# Patient Record
Sex: Female | Born: 1935 | Race: White | Hispanic: No | Marital: Married | Smoking: Never smoker
Health system: Southern US, Community
[De-identification: ages and names within clinical notes are randomized; demographics above are authoritative.]

## PROBLEM LIST (undated history)

## (undated) DIAGNOSIS — K219 Gastro-esophageal reflux disease without esophagitis: Secondary | ICD-10-CM

## (undated) DIAGNOSIS — E785 Hyperlipidemia, unspecified: Secondary | ICD-10-CM

## (undated) DIAGNOSIS — E041 Nontoxic single thyroid nodule: Secondary | ICD-10-CM

## (undated) DIAGNOSIS — M1990 Unspecified osteoarthritis, unspecified site: Secondary | ICD-10-CM

## (undated) DIAGNOSIS — M353 Polymyalgia rheumatica: Secondary | ICD-10-CM

## (undated) HISTORY — PX: BACK SURGERY: SHX140

## (undated) HISTORY — DX: Hyperlipidemia, unspecified: E78.5

## (undated) HISTORY — DX: Gastro-esophageal reflux disease without esophagitis: K21.9

## (undated) HISTORY — DX: Unspecified osteoarthritis, unspecified site: M19.90

## (undated) HISTORY — DX: Nontoxic single thyroid nodule: E04.1

## (undated) HISTORY — DX: Polymyalgia rheumatica: M35.3

---
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
## 1964-10-14 HISTORY — PX: CHOLECYSTECTOMY: SHX55

## 1976-10-14 HISTORY — PX: ABDOMINAL HYSTERECTOMY: SHX81

## 1998-10-04 ENCOUNTER — Emergency Department (HOSPITAL_COMMUNITY): Admission: EM | Admit: 1998-10-04 | Discharge: 1998-10-04 | Payer: Self-pay

## 1999-04-04 ENCOUNTER — Ambulatory Visit (HOSPITAL_COMMUNITY): Admission: RE | Admit: 1999-04-04 | Discharge: 1999-04-04 | Payer: Self-pay | Admitting: Gastroenterology

## 1999-04-04 ENCOUNTER — Encounter (INDEPENDENT_AMBULATORY_CARE_PROVIDER_SITE_OTHER): Payer: Self-pay | Admitting: Specialist

## 1999-05-02 ENCOUNTER — Encounter: Payer: Self-pay | Admitting: Gastroenterology

## 1999-05-02 ENCOUNTER — Ambulatory Visit (HOSPITAL_COMMUNITY): Admission: RE | Admit: 1999-05-02 | Discharge: 1999-05-02 | Payer: Self-pay | Admitting: Gastroenterology

## 2000-02-21 ENCOUNTER — Encounter: Payer: Self-pay | Admitting: Family Medicine

## 2000-02-21 ENCOUNTER — Encounter: Admission: RE | Admit: 2000-02-21 | Discharge: 2000-02-21 | Payer: Self-pay | Admitting: Family Medicine

## 2000-03-31 ENCOUNTER — Encounter: Payer: Self-pay | Admitting: Family Medicine

## 2000-03-31 ENCOUNTER — Encounter: Admission: RE | Admit: 2000-03-31 | Discharge: 2000-03-31 | Payer: Self-pay | Admitting: Family Medicine

## 2001-02-25 ENCOUNTER — Encounter: Payer: Self-pay | Admitting: Family Medicine

## 2001-02-25 ENCOUNTER — Encounter: Admission: RE | Admit: 2001-02-25 | Discharge: 2001-02-25 | Payer: Self-pay | Admitting: Family Medicine

## 2001-06-30 ENCOUNTER — Encounter: Payer: Self-pay | Admitting: Cardiovascular Disease

## 2001-07-01 ENCOUNTER — Inpatient Hospital Stay (HOSPITAL_COMMUNITY): Admission: EM | Admit: 2001-07-01 | Discharge: 2001-07-02 | Payer: Self-pay | Admitting: Emergency Medicine

## 2001-11-25 ENCOUNTER — Encounter: Admission: RE | Admit: 2001-11-25 | Discharge: 2001-11-25 | Payer: Self-pay | Admitting: Family Medicine

## 2001-11-25 ENCOUNTER — Encounter: Payer: Self-pay | Admitting: Family Medicine

## 2002-02-15 ENCOUNTER — Encounter: Payer: Self-pay | Admitting: Cardiovascular Disease

## 2002-03-01 ENCOUNTER — Encounter: Admission: RE | Admit: 2002-03-01 | Discharge: 2002-03-01 | Payer: Self-pay | Admitting: *Deleted

## 2002-03-01 ENCOUNTER — Encounter: Payer: Self-pay | Admitting: *Deleted

## 2002-03-03 ENCOUNTER — Ambulatory Visit (HOSPITAL_BASED_OUTPATIENT_CLINIC_OR_DEPARTMENT_OTHER): Admission: RE | Admit: 2002-03-03 | Discharge: 2002-03-03 | Payer: Self-pay | Admitting: *Deleted

## 2002-07-20 ENCOUNTER — Encounter: Payer: Self-pay | Admitting: Family Medicine

## 2002-07-20 ENCOUNTER — Encounter: Admission: RE | Admit: 2002-07-20 | Discharge: 2002-07-20 | Payer: Self-pay | Admitting: Family Medicine

## 2003-08-17 ENCOUNTER — Encounter: Admission: RE | Admit: 2003-08-17 | Discharge: 2003-08-17 | Payer: Self-pay | Admitting: Family Medicine

## 2004-07-16 ENCOUNTER — Encounter: Payer: Self-pay | Admitting: Cardiovascular Disease

## 2004-08-25 ENCOUNTER — Encounter: Admission: RE | Admit: 2004-08-25 | Discharge: 2004-08-25 | Payer: Self-pay

## 2004-10-23 ENCOUNTER — Encounter: Admission: RE | Admit: 2004-10-23 | Discharge: 2004-10-23 | Payer: Self-pay | Admitting: Family Medicine

## 2005-07-08 ENCOUNTER — Encounter: Payer: Self-pay | Admitting: Cardiovascular Disease

## 2005-08-22 ENCOUNTER — Encounter: Payer: Self-pay | Admitting: Neurology

## 2005-08-23 ENCOUNTER — Encounter (INDEPENDENT_AMBULATORY_CARE_PROVIDER_SITE_OTHER): Payer: Self-pay | Admitting: *Deleted

## 2005-08-23 ENCOUNTER — Inpatient Hospital Stay (HOSPITAL_COMMUNITY): Admission: RE | Admit: 2005-08-23 | Discharge: 2005-08-23 | Payer: Self-pay | Admitting: Interventional Radiology

## 2005-12-18 ENCOUNTER — Encounter: Admission: RE | Admit: 2005-12-18 | Discharge: 2005-12-18 | Payer: Self-pay | Admitting: Family Medicine

## 2007-02-08 ENCOUNTER — Emergency Department (HOSPITAL_COMMUNITY): Admission: EM | Admit: 2007-02-08 | Discharge: 2007-02-09 | Payer: Self-pay | Admitting: Emergency Medicine

## 2007-07-22 ENCOUNTER — Encounter: Admission: RE | Admit: 2007-07-22 | Discharge: 2007-07-22 | Payer: Self-pay | Admitting: Neurological Surgery

## 2009-03-15 ENCOUNTER — Ambulatory Visit: Payer: Self-pay | Admitting: Cardiovascular Disease

## 2009-03-15 ENCOUNTER — Inpatient Hospital Stay (HOSPITAL_COMMUNITY): Admission: EM | Admit: 2009-03-15 | Discharge: 2009-03-18 | Payer: Self-pay | Admitting: Emergency Medicine

## 2009-03-16 ENCOUNTER — Encounter (INDEPENDENT_AMBULATORY_CARE_PROVIDER_SITE_OTHER): Payer: Self-pay | Admitting: Internal Medicine

## 2009-03-25 ENCOUNTER — Encounter: Payer: Self-pay | Admitting: Cardiovascular Disease

## 2009-04-04 ENCOUNTER — Encounter: Payer: Self-pay | Admitting: Cardiovascular Disease

## 2009-04-18 DIAGNOSIS — M129 Arthropathy, unspecified: Secondary | ICD-10-CM | POA: Insufficient documentation

## 2009-04-18 DIAGNOSIS — M810 Age-related osteoporosis without current pathological fracture: Secondary | ICD-10-CM | POA: Insufficient documentation

## 2009-04-18 DIAGNOSIS — Z8669 Personal history of other diseases of the nervous system and sense organs: Secondary | ICD-10-CM

## 2009-04-18 DIAGNOSIS — R092 Respiratory arrest: Secondary | ICD-10-CM | POA: Insufficient documentation

## 2009-04-18 DIAGNOSIS — J189 Pneumonia, unspecified organism: Secondary | ICD-10-CM | POA: Insufficient documentation

## 2009-04-19 ENCOUNTER — Ambulatory Visit: Payer: Self-pay | Admitting: Internal Medicine

## 2009-04-19 DIAGNOSIS — E785 Hyperlipidemia, unspecified: Secondary | ICD-10-CM | POA: Insufficient documentation

## 2009-04-19 DIAGNOSIS — R5383 Other fatigue: Secondary | ICD-10-CM

## 2009-04-19 DIAGNOSIS — R5381 Other malaise: Secondary | ICD-10-CM | POA: Insufficient documentation

## 2009-05-01 DIAGNOSIS — R079 Chest pain, unspecified: Secondary | ICD-10-CM | POA: Insufficient documentation

## 2009-05-01 DIAGNOSIS — F411 Generalized anxiety disorder: Secondary | ICD-10-CM | POA: Insufficient documentation

## 2009-05-01 DIAGNOSIS — K219 Gastro-esophageal reflux disease without esophagitis: Secondary | ICD-10-CM | POA: Insufficient documentation

## 2009-05-02 ENCOUNTER — Ambulatory Visit: Payer: Self-pay | Admitting: Cardiovascular Disease

## 2009-10-26 ENCOUNTER — Ambulatory Visit: Payer: Self-pay | Admitting: Cardiovascular Disease

## 2009-10-30 ENCOUNTER — Telehealth (INDEPENDENT_AMBULATORY_CARE_PROVIDER_SITE_OTHER): Payer: Self-pay | Admitting: Radiology

## 2009-10-31 ENCOUNTER — Ambulatory Visit: Payer: Self-pay | Admitting: Cardiology

## 2009-10-31 ENCOUNTER — Ambulatory Visit: Payer: Self-pay

## 2009-10-31 ENCOUNTER — Encounter (HOSPITAL_COMMUNITY): Admission: RE | Admit: 2009-10-31 | Discharge: 2010-01-19 | Payer: Self-pay | Admitting: Cardiovascular Disease

## 2010-03-01 ENCOUNTER — Encounter (INDEPENDENT_AMBULATORY_CARE_PROVIDER_SITE_OTHER): Payer: Self-pay | Admitting: *Deleted

## 2010-05-01 ENCOUNTER — Ambulatory Visit: Payer: Self-pay | Admitting: Cardiovascular Disease

## 2010-11-03 ENCOUNTER — Encounter: Payer: Self-pay | Admitting: Interventional Radiology

## 2010-11-04 ENCOUNTER — Encounter: Payer: Self-pay | Admitting: Orthopedic Surgery

## 2010-11-13 NOTE — Assessment & Plan Note (Signed)
Summary: Cardiology Nuclear Study  Nuclear Med Background Indications for Stress Test: Evaluation for Ischemia   History: Echo  History Comments: H/O respiratory arrest in setting of pneumonia 03/15/09; 6/10 Echo:EF=60-65%   Symptoms: Chest Tightness, Nausea, Syncope  Symptoms Comments: Last episode of CP:2 days ago. Chest tightness vs "indigestion".   Nuclear Pre-Procedure Cardiac Risk Factors: Family History - CAD, Lipids Caffeine/Decaff Intake: none NPO After: 10:00 PM Lungs: Clear IV 0.9% NS with Angio Cath: 20g     IV Site: (R) AC IV Started by: Stanton Kidney EMT-P Chest Size (in) 38     Cup Size DDD     Height (in): 61 Weight (lb): 162 BMI: 30.72  Nuclear Med Study 1 or 2 day study:  1 day     Stress Test Type:  Stress Reading MD:  Olga Millers, MD     Referring MD:  Charlton Haws, MD Resting Radionuclide:  Technetium 77m Tetrofosmin     Resting Radionuclide Dose:  11.0 mCi  Stress Radionuclide:  Technetium 27m Tetrofosmin     Stress Radionuclide Dose:  32.0 mCi   Stress Protocol Exercise Time (min):  4:31 min     Max HR:  126 bpm     Predicted Max HR:  140 bpm  Max Systolic BP: 215 mm Hg     Percent Max HR:  90 %     METS: 6.4 Rate Pressure Product:  52841    Stress Test Technologist:  Rea College CMA-N     Nuclear Technologist:  Harlow Asa CNMT  Rest Procedure  Myocardial perfusion imaging was performed at rest 45 minutes following the intravenous administration of Myoview Technetium 22m Tetrofosmin.  Stress Procedure  The patient exercised for 4:31.  The patient stopped due to fatigue and hypertensive response, 215/104.  She had new baseline hypertension, sitting 158/95 and standing 153/106.  She denied any chest pain.  There were no significant ST-T wave changes, only occasional PVC's in recovery.  Myoview was injected at peak exercise and myocardial perfusion imaging was performed after a brief delay.  QPS Raw Data Images:  Acuisition technically good;  normal left ventricular size. Stress Images:  There is normal uptake in all areas. Rest Images:  Normal homogeneous uptake in all areas of the myocardium. Subtraction (SDS):  No evidence of ischemia. Transient Ischemic Dilatation:  .83  (Normal <1.22)  Lung/Heart Ratio:  .34  (Normal <0.45)  Quantitative Gated Spect Images QGS EDV:  54 ml QGS ESV:  15 ml QGS EF:  73 % QGS cine images:  Normal wall motion.   Overall Impression  Exercise Capacity: Poor exercise capacity. BP Response: Hypertensive blood pressure response. Clinical Symptoms: No chest pain ECG Impression: No significant ST segment change suggestive of ischemia. Overall Impression: There is no sign of scar or ischemia.  Appended Document: Cardiology Nuclear Study normal nuclear  Appended Document: Cardiology Nuclear Study pt aware of results

## 2010-11-13 NOTE — Progress Notes (Signed)
Summary: Nuc Pre-Procedure  Phone Note Outgoing Call Call back at [facility] Phone [phone]   Call placed by: Leonia Corona, RT-N,  October 30, 2009 3:03 PM Call placed to: Patient Reason for Call: Confirm/change Appt Summary of Call: Reviewed information on Myoview Information Sheet (see scanned document for further details).  Spoke with patient.     Nuclear Med Background Indications for Stress Test: Evaluation for Ischemia   History: Echo  History Comments: H/O respiratory arrest in setting of pneumonia 03/15/09. 03/16/09- Echo- EF= 60-65% GERD  Symptoms: Nausea, Syncope    Nuclear Pre-Procedure Cardiac Risk Factors: Family History - CAD, Lipids Height (in): 61

## 2010-11-13 NOTE — Assessment & Plan Note (Signed)
Summary: rov/syncope/jml   Primary Provider:  Dr. Herb Grays  CC:  dizziness and sob is what pt is complaing of today and pt passed out about 2 weeks ago.  History of Present Illness: [name] he is seen today post hospital discharge.  He was discharged on June 5 after having a syncopal episode.  This was in the setting of dehydration and probable pneumonia.  No cardiac abnormalities in the hospital.  Actually had a respiratory arrest while in Dr. Dewain Penning office.  She has a followup appointment with pulmonary.  Cardiac standpoint she has been fine.  She had normal echo in the hospital and no abnormalities on her telemetry.  She ruled out for myocardial infarction and has a normal EKG.  Since discharge he has improved exercise tolerance with no significant pleuritic pain sputum or shortness of breath.  She has not had any fevers.  I reviewed multiple records from Dr. Yehuda Budd office.  There is an EKG from March 25, 2009 which was normal.  She apparently has had her simvastatin DC'd due to leg cramps and fatigue.  Followup lab work has shown a normal TSH.  Normal white blood cell count normal hematocrit she says she's had a followup x-ray with resolution of her pneumonia.  On hospital discharge there is a persistent left lower lobe defect.  She had another episode of passing out recently.  It was in the setting of constipation after taking a ducolox.  She felt nausea and then passed out.  She appears to get vagal easily and her husband confirms that she has always been a fainter. She had a nomal echo in the hospital.  I think it is reasonable to F/U with stress myovue to assess hemodynamic response to exercise and R/O CAD They would then like to go to [place] as is their custom this time of year.  I would like to have them see EPS I suspect that she would have a positive tilt-table bout I am not sure what I would do with this info and would like their opinion.  Current Problems (verified): 1)   Hyperlipidemia  (ICD-272.4) 2)  Chest Pain  (ICD-786.50) 3)  Anxiety  (ICD-300.00) 4)  Fatique and Malaise  (ICD-780.79) 5)  Osteoporosis  (ICD-733.00) 6)  Arthritis  (ICD-716.90) 7)  Pneumonia  (ICD-486) 8)  Syncope, Hx of  (ICD-V12.49) 9)  Hx of Respiratory Arrest  (ICD-799.1) 10)  Gerd  (ICD-530.81)  Current Medications (verified): 1)  Aspirin Low Dose 81 Mg Tabs (Aspirin) .... Take 1 Tablet By Mouth Once A Day 2)  Calcium Carbonate-Vitamin D 600-400 Mg-Unit Tabs (Calcium Carbonate-Vitamin D) .... Take 1 Tablet By Mouth Two Times A Day 3)  Clonazepam 1 Mg Tabs (Clonazepam) .... Take 1/2 To 1 Tab By Mouth At Bedtime As Needed 4)  Folic Acid 400 Mcg Tabs (Folic Acid) .... Take 1 Tablet By Mouth Once A Day 5)  Magnesium .... Take 2  Tabs By Mouth At Bedtime 6)  Potassium 95 Mg Cr-Tabs (Potassium Gluconate) .Marland Kitchen.. 1 Tab By Mouth Once Daily 7)  Requip 1 Mg Tabs (Ropinirole Hcl) .... 1/2 Morning and Afternoon and 1 At Bedtime 8)  Vitamin D 1000 Unit Tabs (Cholecalciferol) .Marland Kitchen.. 1 Once Daily 9)  Vicodin 5-500 Mg Tabs (Hydrocodone-Acetaminophen) .... As Needed  Allergies (verified): 1)  ! Levaquin  Past History:  Past Medical History: Last updated: 05/01/2009 Current Problems:  HYPERLIPIDEMIA (ICD-272.4) CHEST PAIN (ICD-786.50) ANXIETY (ICD-300.00) FATIQUE AND MALAISE (ICD-780.79) OSTEOPOROSIS (ICD-733.00) ARTHRITIS (ICD-716.90) PNEUMONIA (ICD-486)  SYNCOPE, HX OF (ICD-V12.49) Hx of RESPIRATORY ARREST (ICD-799.1) GERD (ICD-530.81) PMR PNEUMONIA (ICD-486) by CT/cxr 03/15/09 LLL  Past Surgical History: Last updated: 04/19/2009 Cholecystectomy 1966 appendectomy 1948 back-bone spurs hemorrhoids fissure hysterectomy 1978 carpal tunnel Rt hand  Family History: Last updated: May 18, 2009   Mother deceased at age 53, from MI.  Father deceased at   age 23 from pneumonia and renal failure.  She has sister with thyroid   cancer.      Social History: Last updated:  04/19/2009 Married Children Retired Never smoker No ETOH  Review of Systems       Denies fever, malais, weight loss, blurry vision, decreased visual acuity, cough, sputum, SOB, hemoptysis, pleuritic pain, palpitaitons, heartburn, abdominal pain, melena, lower extremity edema, claudication, or rash.   Vital Signs:  Patient profile:   77 year old female Height:      61 inches Weight:      161 pounds BMI:     30.53 Pulse rate:   72 / minute Resp:     12 per minute BP sitting:   133 / 78  (left arm)  Vitals Entered By: Kem Parkinson (October 26, 2009 4:12 PM)  Physical Exam  General:  Affect appropriate Healthy:  appears stated age HEENT: normal Neck supple with no adenopathy JVP normal no bruits no thyromegaly Lungs clear with no wheezing and good diaphragmatic motion Heart:  S1/S2 no murmur,rub, gallop or click PMI normal Abdomen: benighn, BS positve, no tenderness, no AAA no bruit.  No HSM or HJR Distal pulses intact with no bruits No edema Neuro non-focal Skin warm and dry    Impression & Recommendations:  Problem # 1:  SYNCOPE, HX OF (ICD-V12.49) Refer EPS consider tilt table.  R/O CAD with stress myovue Orders: Nuclear Stress Test (Nuc Stress Test) EP Referral (Cardiology EP Ref )  Problem # 2:  HYPERLIPIDEMIA (ICD-272.4) F/U labs with Herb Grays.  Continue diet Rx  Problem # 3:  PNEUMONIA (ICD-486) Resolved with no SOB and normal exam.    Patient Instructions: 1)  Your physician recommends that you schedule a follow-up appointment in: 6 months 2)  Your physician recommends that you continue on your current medications as directed. Please refer to the Current Medication list given to you today. 3)  Your physician has requested that you have an exercise stress myoview.  For further information please visit [email or web address]  Please follow instruction sheet, as given. 4)  You have been referred to EP for syncope in 3 months, questionable need for  tilt table test

## 2010-11-13 NOTE — Letter (Signed)
Summary: Appointment - Reminder 2  [facility], Main Office  [address]   [place], [place] [address]   Phone: [phone]  Fax: [phone]     Mar 01, 2010 MRN: [id number]   [name] [address] [place], [place]  [address]   Dear [name],  Our records indicate that it is time to schedule a follow-up appointment with Dr. Eden Emms. It is very important that we reach you to schedule this appointment. We look forward to participating in your health care needs. Please contact us at the number listed above at your earliest convenience to schedule your appointment.  If you are unable to make an appointment at this time, give [place] a call so we can update our records.     Sincerely,    Migdalia Dk [facility] Scheduling Team

## 2010-11-13 NOTE — Assessment & Plan Note (Signed)
Summary: PER CHECK OUT/SF   Primary Provider:  Dr. Herb Grays  CC:  follow up.  History of Present Illness: Katrin he is seen today post hospital discharge.  He was discharged on June 5 after having a syncopal episode.  This was in the setting of dehydration and probable pneumonia.  No cardiac abnormalities in the hospital.  Actually had a respiratory arrest while in Dr. Dewain Penning office.  She has a followup appointment with pulmonary.  Cardiac standpoint she has been fine.  She had normal echo in the hospital and no abnormalities on her telemetry.  She ruled out for myocardial infarction and has a normal EKG.  Since discharge he has improved exercise tolerance with no significant pleuritic pain sputum or shortness of breath.  She has not had any fevers.  I reviewed multiple records from Dr. Yehuda Budd office.  There is an EKG from March 25, 2009 which was normal.  She apparently has had her simvastatin DC'd due to leg cramps and fatigue.  Followup lab work has shown a normal TSH.  Normal white blood cell count normal hematocrit she says She has a bone spur on her right thumb area that may need a cortisone shot.  She is also seeing a neurologist in [place] and has been put on Gabepentin for restless legs  Myovue 10/31/09 normal and reviewed  Current Problems (verified): 1)  Hyperlipidemia  (ICD-272.4) 2)  Chest Pain  (ICD-786.50) 3)  Anxiety  (ICD-300.00) 4)  Fatique and Malaise  (ICD-780.79) 5)  Osteoporosis  (ICD-733.00) 6)  Arthritis  (ICD-716.90) 7)  Pneumonia  (ICD-486) 8)  Syncope, Hx of  (ICD-V12.49) 9)  Hx of Respiratory Arrest  (ICD-799.1) 10)  Gerd  (ICD-530.81)  Current Medications (verified): 1)  Aspirin Low Dose 81 Mg Tabs (Aspirin) .... Take 1 Tablet By Mouth Once A Day 2)  Calcium Carbonate-Vitamin D 600-400 Mg-Unit Tabs (Calcium Carbonate-Vitamin D) .... Take 1 Tablet By Mouth Two Times A Day 3)  Clonazepam 1 Mg Tabs (Clonazepam) .Marland Kitchen.. 1 Tab By Mouth At Bedtime As Needed 4)  Folic  Acid 400 Mcg Tabs (Folic Acid) .... Take 1 Tablet By Mouth Once A Day 5)  Magnesium .... Take 2  Tabs By Mouth At Bedtime 6)  Potassium 95 Mg Cr-Tabs (Potassium Gluconate) .Marland Kitchen.. 1 Tab By Mouth Once Daily 7)  Requip 1 Mg Tabs (Ropinirole Hcl) .... 1/2 Morning and Afternoon and 1 At Bedtime 8)  Vitamin D 1000 Unit Tabs (Cholecalciferol) .Marland Kitchen.. 1 Once Daily 9)  Vicodin 5-500 Mg Tabs (Hydrocodone-Acetaminophen) .... As Needed 10)  Gabapentin 300 Mg Caps (Gabapentin) .Marland Kitchen.. 1 Tab By Mouth Three Times A Day  Allergies (verified): 1)  ! Levaquin  Past History:  Past Medical History: Last updated: 05/15/09 Current Problems:  HYPERLIPIDEMIA (ICD-272.4) CHEST PAIN (ICD-786.50) ANXIETY (ICD-300.00) FATIQUE AND MALAISE (ICD-780.79) OSTEOPOROSIS (ICD-733.00) ARTHRITIS (ICD-716.90) PNEUMONIA (ICD-486) SYNCOPE, HX OF (ICD-V12.49) Hx of RESPIRATORY ARREST (ICD-799.1) GERD (ICD-530.81) PMR PNEUMONIA (ICD-486) by CT/cxr 03/15/09 LLL  Past Surgical History: Last updated: 04/19/2009 Cholecystectomy 1966 appendectomy 1948 back-bone spurs hemorrhoids fissure hysterectomy 1978 carpal tunnel Rt hand  Family History: Last updated: 2009-05-15   Mother deceased at age 36, from MI.  Father deceased at   age 61 from pneumonia and renal failure.  She has sister with thyroid   cancer.      Social History: Last updated: 04/19/2009 Married Children Retired Never smoker No ETOH  Review of Systems       Denies fever, malais, weight loss, blurry  vision, decreased visual acuity, cough, sputum, SOB, hemoptysis, pleuritic pain, palpitaitons, heartburn, abdominal pain, melena, lower extremity edema, claudication, or rash.   Vital Signs:  Patient profile:   77 year old female Height:      62 inches Weight:      166.8 pounds BMI:     30.62 Pulse rate:   84 / minute Pulse rhythm:   regular Resp:     12 per minute BP sitting:   144 / 78  (left arm) Cuff size:   regular  Vitals Entered By:  Kem Parkinson (May 01, 2010 12:01 PM)  Physical Exam  General:  Affect appropriate Healthy:  appears stated age HEENT: normal Neck supple with no adenopathy JVP normal no bruits no thyromegaly Lungs clear with no wheezing and good diaphragmatic motion Heart:  S1/S2 no murmur,rub, gallop or click PMI normal Abdomen: benighn, BS positve, no tenderness, no AAA no bruit.  No HSM or HJR Distal pulses intact with no bruits No edema Neuro non-focal Skin warm and dry    Impression & Recommendations:  Problem # 1:  SYNCOPE, HX OF (ICD-V12.49) Normal cardiac w/u.  Likely vagal.  No further testing  Problem # 2:  HYPERLIPIDEMIA (ICD-272.4) F/U Dr Yehuda Budd.  Statin stopped due to leg cramps  Problem # 3:  ARTHRITIS (ICD-716.90) ? Bone spur on right wrist area.  F/U ortho and consider steroid injection  Patient Instructions: 1)  Your physician recommends that you schedule a follow-up appointment in: as needed with Dr. Eden Emms 2)  Your physician recommends that you continue on your current medications as directed. Please refer to the Current Medication list given to you today.

## 2010-11-16 ENCOUNTER — Other Ambulatory Visit: Payer: Self-pay | Admitting: Orthopedic Surgery

## 2010-11-16 ENCOUNTER — Ambulatory Visit (HOSPITAL_BASED_OUTPATIENT_CLINIC_OR_DEPARTMENT_OTHER)
Admission: RE | Admit: 2010-11-16 | Discharge: 2010-11-16 | Disposition: A | Discharge status: Home / Self Care | Payer: MEDICARE | Source: Ambulatory Visit | Attending: Orthopedic Surgery | Admitting: Orthopedic Surgery

## 2010-11-16 DIAGNOSIS — D4819 Other specified neoplasm of uncertain behavior of connective and other soft tissue: Secondary | ICD-10-CM | POA: Insufficient documentation

## 2010-11-16 DIAGNOSIS — D481 Neoplasm of uncertain behavior of connective and other soft tissue: Secondary | ICD-10-CM | POA: Insufficient documentation

## 2010-11-16 DIAGNOSIS — M6580 Other synovitis and tenosynovitis, unspecified site: Secondary | ICD-10-CM | POA: Insufficient documentation

## 2010-11-16 DIAGNOSIS — G5600 Carpal tunnel syndrome, unspecified upper limb: Secondary | ICD-10-CM | POA: Insufficient documentation

## 2010-11-16 LAB — POCT I-STAT, CHEM 8
Creatinine, Ser: 0.8 mg/dL (ref 0.4–1.2)
HCT: 39 % (ref 36.0–46.0)
Hemoglobin: 13.3 g/dL (ref 12.0–15.0)
Potassium: 3.9 mEq/L (ref 3.5–5.1)
Sodium: 141 mEq/L (ref 135–145)

## 2010-11-29 NOTE — Op Note (Signed)
[name], [name]               ACCOUNT NO.:  [id number]  MEDICAL RECORD NO.:  [id number]           PATIENT TYPE:  LOCATION:                                 FACILITY:  PHYSICIAN:  Katy Fitch. Xachary Hambly, M.D.      DATE OF BIRTH:  DATE OF PROCEDURE:  11/16/2010 DATE OF DISCHARGE:                              OPERATIVE REPORT   PREOPERATIVE DIAGNOSES: 1. 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 2. Severe left carpal tunnel syndrome. 3. Locking stenosing tenosynovitis, left long finger. 4. Locking stenosing tenosynovitis, left ring finger with mucoid     cyst/giant cell reaction at A1 pulley of ring finger.  POSTOPERATIVE DIAGNOSES: 1. 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 2. Severe left carpal tunnel syndrome. 3. Locking stenosing tenosynovitis, left long finger. 4. Locking stenosing tenosynovitis, left ring finger with mucoid     cyst/giant cell reaction at A1 pulley of ring finger. 5. Identification of bulbous calcific tendinopathy of flexor digitorum     superficialis tendon of long finger requiring subtotaled tenotomy     to allow passive range of motion of long finger despite release of     A1 pulley and synovectomy.  OPERATING SURGEON:  Katy Fitch. Datra Clary, MD  ASSISTANT:  Marveen Reeks Dasnoit, PA-C  ANESTHESIA:  General by LMA supplemented by a left axillary block with lidocaine.  SUPERVISING ANESTHESIOLOGIST:  Burna Forts, MD  INDICATIONS:  [name] is an 77 year old woman referred through courtesy of Dr. Herb Grays of [place] for evaluation and management of very significant left hand and wrist pain.  Clinical examination revealed that she had widespread collagen-related disease including very significant left carpal tunnel syndrome, severe stenosing tenosynovitis left first dorsal compartment with giant-cell tumor formation  at the apex of the compartment, and severe stenosing tenosynovitis of the left long and ring fingers with a large palpable mucoid cyst and/or giant cell reaction at the A1 pulley of the ring finger.  We advised [name] to undergo a series of procedures to correct these mechanical predicaments.  It is likely that she is experiencing a very significant collagen disorder at age 77.  Preoperatively, she was reminded of the potential risks and benefits of surgery.  There was minor risk of infection.  She will need to exercise her hand vigorously immediately following surgery.  Preoperatively, she was interviewed by Dr. Jacklynn Bue.  He recommended regional anesthesia in addition to general anesthesia by LMA.  This was recommended and accepted.  PROCEDURE:  [name] was brought to room one at [facility] and placed in a supine position on the operating table.  Under Dr. Marlane Mingle direct supervision, general anesthesia by LMA technique was induced.  Had an axillary block and placed lidocaine in the holding area leading to excellent anesthesia and prominences of the left arm.  The arm was prepped with Betadine soap solution, sterilely draped. Pneumatic tourniquet was applied to proximal left brachium.  Her drug allergies to codeine and Levaquin were noted.  A routine  surgical time- out was accomplished.  Left arm was exsanguinated with Esmarch bandage and arterial tourniquet at the proximal brachium was inflated at 250 mmHg.  Procedure commenced with a short transverse incision directly over the thickened first dorsal compartment.  Soft tissues were carefully dissected elevating the radial superficial sensory branches that were adherent to the first dorsal compartment.  Blunt retractors were placed. A giant cell reaction on the first dorsal compartment was debrided with a rongeur followed by splitting of the compartment.  The wall thickness of the compartment was more than  4 mm thick.  The compartment was released revealing two slips of the abductor pollicis longus and a single dorsal slip of the extensor pollicis brevis in a separate compartment.  The septum between the compartment was excised.  This wound was then repaired with intradermal 2-0 Prolene.  Attention was then directed to the left palm.  A 2-cm incision was fashioned in line of the ring finger and the palm.  Subcutaneous tissues were carefully divided in the palmar fascia.  This split longitudinally to the common sensory branch of the median nerve.  These were followed back to the transcarpal ligament, which was gently isolated from the median nerve with Catering manager.  The ligament was calcific and quite thick.  This was released with scissors subcutaneously into the distal forearm.  This widely opened the canal.  No mass or other predicaments were noted.  Attention was then directed to the long finger.  An oblique incision was fashioned over the thickened A1 pulley.  Subcutaneous tissues were carefully divided taking care to gently retract the neurovascular bundles.  The A1 pulley was calcific and very thickened.  This was split with scalpel and scissors.  There was a large knot of calcific tendinopathy noted in the superficialis tendons despite release of A1 pulley still causing triggering on the A2 pulley.  A central football- shaped segment of tendon was resected to reduce the bulk of the superficialis tendon.  The resected segment of tendon that was calcific and gritty was passed off in formalin for pathologic evaluation.  It is possible that the calcium will dissolve in the formalin.  After the tenotomy was accomplished, full and free passive range of motion was recovered.  Attention was then directed to the ring finger.  The A1 pulley was isolated.  A bilobed mucoid cyst type lesion with some giant-cell change was noted.  This was debrided with a rongeur.  The pulley  was then split with scalpel and scissors.  The tendon was delivered and found to be invested in hypertrophic synovium.  This was resected with scissors and rongeur dissection.  After synovectomy, full range of motion was accomplished passively in the left ring finger.  All wounds were irrigated followed by inspection for bleeding points. Wounds were repaired with intradermal 2-0 Prolene.  Steri-Strips were applied.  The wounds were then dressed with sterile gauze, sterile Webril, Kerlix between the fingers and a volar plaster splint supporting the wrist in 10 degrees of dorsiflexion.  There were no apparent complications.  For aftercare, [name] is provided prescriptions for Vicodin 5 mg one p.o. q.4-6 h. p.r.n. pain, also doxycycline 100 mg p.o. b.i.d. x5 days as prophylactic antibiotic due to her multiple incisions.     Katy Fitch [name], M.D.     RVS/MEDQ  D:  11/16/2010  T:  11/17/2010  Job:  [id number]  cc:   Tammy R. Collins [place], M.D.  Electronically Signed by Josephine Igo M.D. on  11/29/2010 08:50:37 AM

## 2011-01-17 ENCOUNTER — Telehealth: Payer: Self-pay | Admitting: Cardiovascular Disease

## 2011-01-17 NOTE — Telephone Encounter (Signed)
12 lead faxed to [place] @ [phone] 01/17/11/KM

## 2011-01-21 LAB — CBC
HCT: 33.1 % — ABNORMAL LOW (ref 36.0–46.0)
Hemoglobin: 11.2 g/dL — ABNORMAL LOW (ref 12.0–15.0)
MCV: 93.2 fL (ref 78.0–100.0)
WBC: 9 10*3/uL (ref 4.0–10.5)

## 2011-01-21 LAB — CARDIAC PANEL(CRET KIN+CKTOT+MB+TROPI)
CK, MB: 1 ng/mL (ref 0.3–4.0)
CK, MB: 1.1 ng/mL (ref 0.3–4.0)
Relative Index: INVALID (ref 0.0–2.5)
Total CK: 70 U/L (ref 7–177)
Total CK: 84 U/L (ref 7–177)
Troponin I: 0.02 ng/mL (ref 0.00–0.06)

## 2011-01-21 LAB — URINE CULTURE: Colony Count: >=100000

## 2011-01-21 LAB — BASIC METABOLIC PANEL
CO2: 27 mEq/L (ref 19–32)
Calcium: 9 mg/dL (ref 8.4–10.5)
Chloride: 104 mEq/L (ref 96–112)
GFR calc Af Amer: >60 mL/min (ref >60)
GFR calc non Af Amer: 54 mL/min — ABNORMAL LOW (ref >60)
GFR calc non Af Amer: >60 mL/min (ref >60)
Potassium: 3.6 mEq/L (ref 3.5–5.1)
Sodium: 136 mEq/L (ref 135–145)
Sodium: 139 mEq/L (ref 135–145)

## 2011-01-21 LAB — PROTIME-INR: Prothrombin Time: 13.2 seconds (ref 11.6–15.2)

## 2011-01-21 LAB — HEPATIC FUNCTION PANEL
ALT: 15 U/L (ref 0–35)
AST: 22 U/L (ref 0–37)
Alkaline Phosphatase: 43 U/L (ref 39–117)
Bilirubin, Direct: 0.1 mg/dL (ref 0.0–0.3)
Total Bilirubin: 0.9 mg/dL (ref 0.3–1.2)

## 2011-01-21 LAB — CULTURE, BLOOD (ROUTINE X 2)
Culture: NO GROWTH
Culture: NO GROWTH

## 2011-01-21 LAB — MAGNESIUM: Magnesium: 2.2 mg/dL (ref 1.5–2.5)

## 2011-01-21 LAB — URINALYSIS, ROUTINE W REFLEX MICROSCOPIC
Hgb urine dipstick: NEGATIVE
Specific Gravity, Urine: 1.008 (ref 1.005–1.030)
Urobilinogen, UA: 0.2 mg/dL (ref 0.0–1.0)
pH: 6 (ref 5.0–8.0)

## 2011-01-21 LAB — LIPID PANEL
Triglycerides: 80 mg/dL (ref <150)
VLDL: 16 mg/dL (ref 0–40)

## 2011-01-21 LAB — POCT CARDIAC MARKERS: Troponin i, poc: <0.05 ng/mL (ref 0.00–0.09)

## 2011-01-24 ENCOUNTER — Ambulatory Visit (HOSPITAL_BASED_OUTPATIENT_CLINIC_OR_DEPARTMENT_OTHER)
Admission: RE | Admit: 2011-01-24 | Discharge: 2011-01-24 | Disposition: A | Discharge status: Home / Self Care | Payer: MEDICARE | Source: Ambulatory Visit | Attending: Orthopedic Surgery | Admitting: Orthopedic Surgery

## 2011-01-24 DIAGNOSIS — M66339 Spontaneous rupture of flexor tendons, unspecified forearm: Secondary | ICD-10-CM | POA: Insufficient documentation

## 2011-01-24 DIAGNOSIS — M65849 Other synovitis and tenosynovitis, unspecified hand: Secondary | ICD-10-CM | POA: Insufficient documentation

## 2011-01-24 DIAGNOSIS — Z01812 Encounter for preprocedural laboratory examination: Secondary | ICD-10-CM | POA: Insufficient documentation

## 2011-01-24 DIAGNOSIS — M65839 Other synovitis and tenosynovitis, unspecified forearm: Secondary | ICD-10-CM | POA: Insufficient documentation

## 2011-01-24 DIAGNOSIS — M654 Radial styloid tenosynovitis [de Quervain]: Secondary | ICD-10-CM | POA: Insufficient documentation

## 2011-01-24 LAB — POCT HEMOGLOBIN-HEMACUE: Hemoglobin: 13.3 g/dL (ref 12.0–15.0)

## 2011-01-25 NOTE — Op Note (Signed)
[name], LEATON               ACCOUNT NO.:  [id number]  MEDICAL RECORD NO.:  [id number]           PATIENT TYPE:  LOCATION:                                 FACILITY:  PHYSICIAN:  Katy Fitch. Militza Devery, M.D.      DATE OF BIRTH:  DATE OF PROCEDURE:  01/24/2011 DATE OF DISCHARGE:                              OPERATIVE REPORT   PREOPERATIVE DIAGNOSES:  Severe stenosing tenosynovitis, right first dorsal compartment, right thumb, right long, and right ring fingers with flexion impairment right long finger.  POSTOPERATIVE DIAGNOSES:  Severe stenosing tenosynovitis, right first dorsal compartment, right thumb, right long, and right ring fingers with flexion impairment right long finger with identification of partial flexor digitorum profundus, rupture of right long finger causing locking beneath the A2 pulley.  OPERATION: 1. Release of right first dorsal compartment. 2. Release of right thumb A1 pulley. 3. Release of right long finger A1 pulley. 4. Release of right ring finger A1 pulley. 5. Partial resection of necrotic flexor digitorum profundus tendon,     right long finger. 6. Partial resection of flexor digitorum superficialis right long     finger to prevent locking beneath the A2 pulley.  OPERATING SURGEON:  Katy Fitch. Jahaira Earnhart, M.D.  ASSISTANT:  Jonni Sanger, P.A.  ANESTHESIA:  2% lidocaine 4.5 mL supplemented by IV sedation.  SUPERVISING ANESTHESIOLOGIST:  Dr. Gelene Mink.  INDICATIONS:  [name] is an 77 year old woman referred through the courtesy of Dr. Herb Grays for evaluation and management of bilateral trigger fingers, first dorsal compartment stenosing tenosynovitis, and hand pain.  She was noted to have inability to flex her right long finger, locking of the finger in flexion, and inability to flex the PIP joint beyond 70 degrees due to impairment of her flexor tendons.  We had a long detailed informed consent.  We advised to proceed with staged  bilateral hand surgery.  Her left hand surgery has been completed leading to excellent recovery of function.  Her right hand surgery is now scheduled anticipating release of the right first dorsal compartment, right thumb A1 pulley, right long finger A1 pulley, and right ring finger A1 pulley.  Due to the flexion block in the long finger other pathologies were likely to be identified and will be addressed the time of exploration.  Questions were invited and answered in detail with Ms. Kosar in the holding area.  PROCEDURE:  [name] was brought to Room 1 of the [facility] and placed in supine position upon the operating table. Following an anesthesia consult with Dr. Gelene Mink in the holding area, local anesthesia and sedation was recommended and accepted.  She was provided sedation in Room 1 followed by Betadine prep of her right hand.  After informed consent, 2% lidocaine was infiltrated at the path of the intended incisions at the right first dorsal compartment, right thumb, right long finger, and right ring finger.  After a few moments, excellent anesthesia was achieved.  The right arm was then prepped with Betadine soap and solution, sterilely draped.  A pneumatic tourniquet was applied to the proximal right brachium.  Following exsanguination of  the right arm with Esmarch bandage, arterial tourniquet was inflated to 250 mmHg due to mild systolic hypertension.  Procedure commenced with short transverse incision around the first dorsal compartment.  The subcutaneous tissues were carefully divided taking care to retract all of radial superficial sensory branches.  The first dorsal compartment was noted to have increased wall thickness of approximately 4 mm.  This was incised with scalpel and scissors, and a pair of abductor pollicis longus tendon was identified.  The extensor pollicis brevis was noted in a separate dorsal compartment.  The septum was resected.   This wound was then repaired with intradermal 3-0 Prolene and Steri-Strips.  Attention was directed to the right thumb where a transverse incision was fashioned directly over the thickened A1 pulley.  The subcutaneous tissues were carefully divided taking care to retract the radial proper digital nerve.  This pulley was split with scalpel and scissors, tendons delivered.  The tendon was moderately necrotic.  Full range of motion of the PIP joint was recovered.  Attention was then directed to the right long finger.  A Brunner zigzag incision was fashioned to allow greater exposure of this more complicated finger retinacular pathology.  The A1 pulley was isolated, split with scalpel and scissors, the tendons delivered.  The superficialis tendon had a large nodule locking beneath the A2 pulley. The profundus tendon had a 40% rupture with ragged fragments of tendon trapped within the retinacular sheath.  The profundus tendon was retracted and debrided with scalpel and scissors and rongeur dissection.  A synovectomy was accomplished for both tendons.  Due to persistent locking beneath the A2 pulley, a central resection of the superficialis tendon was accomplished reducing the bulk of the superficialis tendon allowing passage beneath the A2 pulley.  The ring finger A1 pulley was isolated to its fourth incision. The pretendinous fibers of palmar fascia released, the A1 pulley isolated, split with scalpel and scissors.  Thereafter, free range of motion of all fingers and thumb was demonstrated by Ms. Elige Radon.  The wounds were repaired with intradermal 3-0 Prolene with Steri-Strips. Compressive dressing was supplied with Xeroflo sterile gauze and Ace wrap.  There were no apparent complications.     Katy Fitch Zaryan Yakubov, M.D.     RVS/MEDQ  D:  01/24/2011  T:  01/24/2011  Job:  [id number]  cc:   Tammy R. Collins Scotland, M.D.  Electronically Signed by Josephine Igo M.D. on 01/25/2011 10:15:27 AM

## 2011-02-26 NOTE — Discharge Summary (Signed)
[name], [name]               ACCOUNT NO.:  [id number]   MEDICAL RECORD NO.:  [id number]          PATIENT TYPE:  INP   LOCATION:  4713                         FACILITY:  [facility]   PHYSICIAN:  Ruthy Dick, MD    DATE OF BIRTH:  October 27, 1926   DATE OF ADMISSION:  03/15/2009  DATE OF DISCHARGE:  03/18/2009                               DISCHARGE SUMMARY   REASON FOR ADMISSION:  Pleuritic chest pain and syncopal episode.   FINAL DISCHARGE DIAGNOSES:  1. Syncope, most likely from volume depletion.  2. Pneumonia.  3. Orthostasis, resolved.  4. Restless leg syndrome.  5. Dyslipidemia.  6. Anxiety disorder.  7. Abnormal CT scan of the chest and abnormal chest x-ray.  8. Dehydration.   CONSULT DURING THIS ADMISSION:  Cardiology consult.   PROCEDURES DONE DURING THIS ADMISSION:  1. CT scan of the head which showed no acute intracranial      abnormalities.  2. CT angiogram of the chest which showed no evidence of pulmonary      embolism or dissection, but there was airspace opacity in the left      lower lobe and this was also suggestive of pneumonia in the      posterior basal segment.  Followup for clearance was recommended to      exclude likely possibility of underlying malignancy.  There was      also multiple hypodense lesions present in the liver.  3. MRI of the head which was read and noted to be age-related atrophy      and chronic vessels.  No acute reversible process.  MRI of the C-      spine was read as having spondylosis at C5-C6 with osteophytic      encroachment upon the foramina, right worse than left.  4. MRI of the abdomen was done and was noted to have liver lesions      most likely representing benign cysts and left lower lobe      pneumonia.   BRIEF HISTORY OF PRESENT ILLNESS AND HOSPITAL COURSE:  This is a  pleasant 77 year old lady with restless leg syndrome and dyslipidemia,  who came into the hospital with history of chest pain and also a  syncopal  episode.  She was ruled out for acute coronary syndrome with  serial enzymes and EKG, but at the same time because of syncopal  episode, workup was done in this regards.  A 2-D echo did not show any  abnormalities.  Ejection fraction was 60-65%.  Carotid Doppler  preliminarily did not show any significant occlusions as well.  In any  case, the patient was noted to have pneumonia on the CT scan and chest x-  ray, and because of this, the patient was started on Avelox.  It was  also theorized that pneumonia may have been the reason for the patient's  possible dehydration leading to volume depletion and eventual syncopal  episodes.  While in the hospital, pneumonia was treated and the patient  was given a very gentle hydration.  With resolution of orthostatic  vitals, the patient was able to  stand up without passing out any more.  In the last day, she has done very well and has been anxious to go home.   PHYSICAL EXAMINATION:  VITAL SIGNS:  Today shows temperature of 98.7,  pulse 88, respiration 18, blood pressure 122/80, and saturating 100% on  room air.  Orthostatic vitals shows a lying blood pressure of 122/80 and  pulse of 88, and standing blood pressure of 120/84 and pulse of 101.  CHEST:  Clear to auscultation bilaterally today.  No wheezing and no  crackles.  ABDOMEN:  Soft, nontender.  EXTREMITIES:  No clubbing.  No cyanosis.  No edema.  CARDIOVASCULAR:  First and second heart sounds only.  CENTRAL NERVOUS SYSTEM:  Nonfocal.   The patient is to go home on:  1. Folic acid 1 mg p.o. daily.  2. Aspirin 81 mg p.o. daily.  3. ReQuip 0.5 mg p.o. q.a.m. and 1 mg p.o. at bedtime.  4. Calcium.  5. Vitamin D 31,000 units daily.  6. Clonazepam 1 mg p.o. at bedtime.  7. Avelox 400 mg p.o. q.7 days.  8. Zocor 10 mg p.o. daily.   She is to follow up with Dr. Collins Scotland in 2-3 weeks.  The patient is to call  for this appointment.  She is also to follow up with her cardiologist,  Dr. Charlton Haws in 6 weeks.  The patient is also to call for this  appointment, phone number will be provided.  We recommend that the  patient have a chest x-ray PA and lateral in about 2 weeks to follow up  on left lower lobe infiltrates and if these infiltrates persist after  treatment of pneumonia for a total of 10 days, then a repeat CT scan of  the chest may be in order.  The idea behind this is to rule out  possibility of malignancy.  We anticipate that Dr. Collins Scotland will follow up  on this.   Time used for discharge planning is greater than 30 minutes.      Ruthy Dick, MD  Electronically Signed     GU/MEDQ  D:  03/18/2009  T:  03/19/2009  Job:  [id number]   cc:   Tammy R. Collins Scotland, M.D.  Noralyn Pick. Eden Emms, MD, [facility]

## 2011-02-26 NOTE — H&P (Signed)
[name], [name]               ACCOUNT NO.:  [id number]   MEDICAL RECORD NO.:  [id number]          PATIENT TYPE:  EMS   LOCATION:  MAJO                         FACILITY:  [facility]   PHYSICIAN:  Eduard Clos, MDDATE OF BIRTH:  [birth year]   DATE OF ADMISSION:  03/15/2009  DATE OF DISCHARGE:                              HISTORY & PHYSICAL   PRIMARY CARE PHYSICIAN:  Dr. Herb Grays.   CHIEF COMPLAINT:  Chest pain.   HISTORY OF PRESENT ILLNESS:  A 77 year old female with the known history  of restless leg syndrome, hyperlipidemia, off medication, presents with  complaints of neck pain radiating to the chest since yesterday.  The  patient had gone to her primary care physician's clinic today and when  she had a syncopal episode and was referred to the ER.  The patient  stated that the chest pain that started last evening was actually pain  which started in neck, radiating to left side of the chest, lasting only  for a few minutes and reportedly when she takes rest in the bed.  The  pain was shooting type of pain which had no relation to exertion or any  associated shortness of breath.  The patient did have some chills last  night when she has to use extra bed covers.  The patient today again at  this same pain and went to her primary care physician, Dr. Kerri Perches  office where she had an episode of loss of consciousness.  She does not  remember how long she lost consciousness, but definitely say that she  did not have any tongue biting or incontinence of urine.  She remembers  EMS bringing her to the hospital.  The patient did not have any focal  deficits or headache or any nausea, vomiting or diarrhea, fever or  chills, any abdominal pain.  Presently the patient is chest pain free  and is wanting to eat something.   PAST MEDICAL HISTORY:  Restless leg syndrome, history of hyperlipidemia  off medications.  Anxiety disorder.   PAST SURGICAL HISTORY:  Hysterectomy,  appendectomy, cholecystectomy,  kyphoplasty for lumbar fracture.   MEDICATIONS ON ADMISSION:  1. Folic acid 1 mg p.o. daily.  2. Aspirin 81 mg daily.  3. Requip 0.5 mg in a.m., evening and 1 mg at bedtime.  4. Calcium 600 mg p.o. daily.  5. Vitamin E 3000 international units p.o. daily.  6. Clonazepam 1 mg p.o. at bedtime.   ALLERGIES:  CODEINE.   FAMILY HISTORY:  Nothing contributory.   SOCIAL HISTORY:  The patient denies smoking cigarettes, taking alcohol  or using illegal drugs.  Lives with her husband.   REVIEW OF SYSTEMS:  As per history of present illness.  Nothing else  significant.   PHYSICAL EXAMINATION:  CONSTITUTIONAL:  The patient evaluated at bedside  not in acute distress.  VITAL SIGNS:  Blood pressure 150/70, pulse 90, O2 sat 100%.  HEENT: Anicteric.  No pallor.  No facial asymmetry.  Tongue is midline.  No neck rigidity.  CHEST:  Bilateral air entry present.  No crepitation.  HEART:  S1,  S2 heard.  ABDOMEN:  Soft, nontender.  Bowel sounds heard.  CNS: Alert and oriented to person.  Moves upper and lower extremities  5/5.  EXTREMITIES:  Peripheral pulses felt.  No edema.   LABS:  EKG normal sinus rhythm with no acute ST-T changes.  CT of the  head nothing acute.  Chest x-ray shows no active lung disease.  CT angio  chest shows no embolism, dissection, air space opacity the left lower  lobe is suspicious for pneumonia in the posterior basal segment.  Followup is recommended, multiple hypodense lesions are present in  liver, although large lesion likely a cyst, the remaining lesions are  technically nonspecific.  MRI is requested.  Basic metabolic panel  sodium 136, potassium 4, chloride 103, carbon dioxide 27, glucose 111,  BUN 15, creatinine 0.7, magnesium 2.2, calcium 90, troponin-I 0.01 and  less than 0.05, CK-MB less than 1, BNP 44.   ASSESSMENT:  1. Possible pneumonia.  2. Syncope and some chest pain, atypical probably cervical      radiculopathy.   3. Abnormal liver lesions.  4. History of restless leg syndrome.   PLAN:  Will admit the patient to telemetry, cycle cardiac markers at  this time.  MRI of the brain and C-spine.  Will also get MRI of the  abdomen to check for abnormal liver lesions.  Will start the patient on  Avelox for possibly pneumonia.  The patient will need a repeat CT chest  pain in 1 to 2 months to take sure the complete resolution of the lesion  of the lung, and we will follow cultures and further recommendations as  condition evolves.      Eduard Clos, MD  Electronically Signed     ANK/MEDQ  D:  03/15/2009  T:  03/15/2009  Job:  [id number]   cc:   Tammy R. Collins Scotland, M.D.

## 2011-02-26 NOTE — Consult Note (Signed)
NAMELAWANDA, [name]               ACCOUNT NO.:  [id number]   MEDICAL RECORD NO.:  [id number]          Morse TYPE:  INP   LOCATION:  4713                         FACILITY:  [facility]   PHYSICIAN:  Noralyn Pick. Eden Emms, MD, FACCDATE OF BIRTH:  09/29/1927   DATE OF CONSULTATION:  03/17/2009  DATE OF DISCHARGE:                                 CONSULTATION   CARDIOLOGIST:  New, will be Noralyn Pick. Eden Emms, MD, [facility].   PRIMARY CARE PHYSICIAN:  Tammy R. Collins Scotland, MD   [name] is a delightful 77 year old Caucasian female who we were  asked to consult secondary to a syncopal episode.  [name]  apparently has previous history of syncopal episodes brought on by  nausea, vomiting, or diarrhea in Gina past.  [name] states this has been  going on since [name] was a teenager.  [name] has had extensive workups in Gina  past.  [name] was in her usual state of health until yesterday.  [name] states [name] was feeling well.  [name] had taken a [place]  [place] her [place] last weekend, they returned home.  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  [name] walked into Dr. Kerri Perches office,  stepped on Gina scale weight and stated that [name] just felt sick and  thought [name] was going to vomit, saying that [name] realized [name] was moving  out, staff quickly grabbed Gina chair.  Apparently, [name] went out  of Gina chair.  Daughter reports [name] was then placed on a floor.  There is no documentation to support anything, but daughter also states  that [name] stopped breathing and was given assisted ventilations  with a bag valve mask.  [name] was transported to [facility] for  further evaluation here.  Attempt to check orthostatics, [name] once again  had a near syncopal episode.  [name] has been  hydrated and is being treated  for pneumonia.  [name] has been up walking in [place] hall today with [place] PT  staff and had no further episodes of syncope.   PAST MEDICAL HISTORY:  1. GERD.  2. Restless leg syndrome.  3. Polymyalgia rheumatica.  4. High cholesterol.  5. Anxiety disorder.  6. Syncope associated with nausea, vomiting, or diarrhea.  7. Appendectomy.  8. Right carpal tunnel release.  9. Kyphoplasty for lumbar fracture.   SOCIAL HISTORY:  [name] lives in [place] with her husband.  [name] is  retired from [employer].  [name] has 2 adult children.  Denies any tobacco, EtOH,  or illicit substance use.  [name] has to follow a heart healthy diet.   FAMILY HISTORY:  Mother deceased at age 60, from MI.  Father deceased at  age 46 from pneumonia and renal failure.  [name] has sister with thyroid  cancer.   REVIEW OF SYSTEMS:  Positive for syncope, generalized weakness, pain in  left neck  and arm and nausea.  All other systems reviewed and negative.   ALLERGIES:  1. CODEINE.  2. LEVAQUIN.   MEDICATIONS AT HOME:  Aspirin, vitamin C, vitamin D, Klonopin, Darvocet,  folic acid, magnesium, ReQuip, Zocor, and potassium over-Gina-counter.  Here, [name] is on Protonix, Lovenox, aspirin, and Avelox.   PHYSICAL EXAMINATION:  VITAL SIGNS:  Temperature 93, heart rate 71,  respirations 16, blood pressure 101/60, stating 96% on 2 L.  GENERAL:  In no acute distress.  HEENT:  Unremarkable.  NECK:  Supple without lymphadenopathy, bruits, or JVD.  CARDIOVASCULAR:  S1 and S2.  Regular rate and rhythm.  LUNGS:  Distant in bilateral bases.  ABDOMEN:  Soft and nontender.  Positive bowel sounds.  LOWER EXTREMITIES:  Without clubbing, cyanosis, or edema.  NEUROLOGIC:  Alert and oriented x3.   Chest x-ray showing no acute findings, however, CT of Gina chest show no  pulmonary emboli.  CT of [place] head showed negative for acute CVA or  bleed.  MRI showed no acute findings.  EKG sinus rhythm rate of 89.  Hematocrit 33.1.   Potassium 3.6.  Creatinine 0.9.  Point of cares are  negative x3.  D-dimer 1.18.  BNP 44.   IMPRESSION:  Syncope felt to be secondary to vagal response in Gina  setting of dehydration and pneumonia.  [name] being managed quite  nicely by attending physician.  No need for stress test.  Echocardiogram  results are pending, but EKG is normal.  Dr. Eden Emms has been into  examine and assess, [name] agrees with plan of care.      Dorian Pod, ACNP      Noralyn Pick. Eden Emms, MD, [facility]  Electronically Signed    MB/MEDQ  D:  03/17/2009  T:  03/17/2009  Job:  [id number]

## 2011-03-01 NOTE — Op Note (Signed)
[place]  Patient:    [name], [name] Visit Number: [id number] MRN: [id number]          Service Type: DSU Location: [facility] Attending Physician:  Kendell Bane Dictated by:   Lowell Bouton, M.D. Proc. Date: 03/03/02 Admit Date:  03/03/2002   CC:         Beulah Gandy. Royetta Asal, M.D.  Tammy R. Collins Scotland, M.D.   Operative Report  PREOPERATIVE DIAGNOSIS:  Right carpal tunnel syndrome.  POSTOPERATIVE DIAGNOSIS:  Right carpal tunnel syndrome.  OPERATION PERFORMED:  Decompression median nerve, right carpal tunnel.  SURGEON:  Lowell Bouton, M.D.  ANESTHESIA:  0.5% Marcaine local with sedation.  OPERATIVE FINDINGS:  The patient had significant compression of the nerve. The motor branch was intact and there were no masses identified in the carpal tunnel  DESCRIPTION OF PROCEDURE:  Under 0.5% Marcaine local anesthesia with a tourniquet on the right arm, the right hand was prepped and draped in the usual fashion and after exsanguinating the limb, the tourniquet was inflated to 250 mmHg.  A 3 cm longitudinal incision was made in the palm just ulnar to the thenar crease.  Sharp dissection was carried through the subcutaneous tissues and bleeding points were coagulated.  Blunt dissection was carried down through the superficial palmar fascia distal to the transverse carpal ligament.  A hemostat was placed in the carpal canal up against the hook of the hamate and the transverse carpal ligament was divided on the ulnar border of the median nerve.  The proximal end of the ligament was divided with the scissors after dissecting the nerve away from the undersurface of the ligament.  The carpal canal was then palpated and was found to be adequately decompressed.  The nerve was examined and motor branch identified.  The wound was irrigated with saline and the skin was closed with 4-0 nylon sutures. Sterile dressings were applied  followed by a volar wrist splint.  The patient tolerated the procedure well and went to the recovery room awake and stable in good condition. Dictated by:   Lowell Bouton, M.D. Attending Physician:  Kendell Bane DD:  03/03/02 TD:  03/03/02 Job: [id number]

## 2011-03-01 NOTE — H&P (Signed)
[facility]  Patient:    [name], [name] Visit Number: [id number] MRN: [id number]          Service Type: MED Location: [id number] Attending Physician:  Gracelyn Nurse Dictated by:   Gracelyn Nurse, M.D. Admit Date:  06/30/2001                           History and Physical  CHIEF COMPLAINT:  Weakness.  HISTORY OF PRESENT ILLNESS:  This is a 77 year old white female who presents with a two-week history of fatigue and muscle pain.  The muscle pain started in the legs and now is in the hip and shoulder area.  She states this happened four days after starting Celebrex 200 mg q.d. for left wrist pain.  She stopped the Celebrex about one week ago, but has become no better.  She complains that she was so weak yesterday that she could hardly walk.  She also states that she has had some diarrhea over the last few days with black looking stools.  PAST MEDICAL HISTORY:  Hypercholesterolemia, GERD, restless leg syndrome, and arthritis.  PAST SURGICAL HISTORY:  Status post hysterectomy, status post appendectomy, and status post hemorrhoidectomy.  ALLERGIES:  CODEINE.  MEDICATIONS: 1. Lipitor 10 mg q.d. 2. Premarin 0.625 mg q.d. 3. Zyrtec 10 mg q.d. 4. Calcium 600 mg q.d. 5. Diazepam 5 mg one half q.h.s. p.r.n. 6. Aspirin 81 mg q.d. 7. Nexium 40 mg q.d.  SOCIAL HISTORY:  Does smoke any tobacco.  Does not drink any alcohol.  [place]  She is married.  FAMILY HISTORY:  Her mother died of an MI at age 40.  Her father died of a stroke at age 68.  REVIEW OF SYSTEMS:  Positive for dysphagia, for fatigue, for joint pain, for muscle pain, and for heartburn.  PHYSICAL EXAMINATION:  VITAL SIGNS:  The temperature is 97.8 degrees, pulse 94, respirations 18, and blood pressure 139/85.  GENERAL APPEARANCE:  This is a well-nourished, white female who appears mildly fatigued.  HEENT:  Pupils equal, round, and reactive to  light.  Extraocular movements intact.  The throat is clear.  The tympanic membranes are nonbulging.  NECK:  There is no JVD.  No thyromegaly.  No lymphadenopathy.  CARDIOVASCULAR:  Regular rate and rhythm.  No murmurs.  There is an S4.  LUNGS:  Clear to auscultation.  ABDOMEN:  Soft, nontender, and nondistended.  Bowel sounds are positive.  EXTREMITIES:  There is no edema.  There is some mild tenderness in the hip girdle area.  NEUROLOGIC:  Strength is 5/5 bilaterally.  Cranial nerves II-XII grossly intact.  RECTAL:  Guaiac negative per Tammy R. Collins Scotland, M.D.  ADMITTING LABORATORY DATA:  The white blood cell count is 8.2, hemoglobin 12.6, and platelets 384.  The urinalysis is negative.  The CK is 41.  Sodium 138, potassium 3.7, chloride 102, CO2 30, BUN 19, creatinine 0.8, glucose 107, calcium 9.1, total protein 7.9, albumin 3.5, total bilirubin 0.6, alkaline phosphatase 105, SGPT 19, SGOT 24.  The EKG on November 24, 1998, showed normal sinus rhythm.  A repeat EKG today at Dr. Bayard Beaver. [place] office showed some T-wave flattening in lateral leads, but no acute changes.  ASSESSMENT AND PLAN: 1. Fatigue.  Etiology unknown.  I doubt this is an adverse drug reaction to    Celebrex.  She is no anemic on labs, however, I am concerned about the  history of black diarrhea.  Will monitor her hemoglobin and guaiac her    stools while she is here in the hospital.  Will also check a TSH as this    could be causing her fatigue. 2. Muscle pain.  I doubt this is also a Celebrex reaction.  She is on Lipitor    and has been for some time, but her CK today is normal, so I am not going    to stop it.  Doubt rhabdomyolysis from Lipitor.  This sounds more like a    rheumatologic-type illness.  Will check a sedimentation rate and an ANA.    If the ANA is positive, then will need to subtype it.  If the sedimentation    rate is elevated, I will consider a diagnosis of polymyalgia rheumatica and     consider steroids at that time. 3. Diarrhea.  May be secondary to the Celebrex, but I am concerned about her    saying that it was black in color, so we will continue to monitor her    stools and her hemoglobin. Dictated by:   Gracelyn Nurse, M.D. Attending Physician:  Marcelino Duster D DD:  06/30/01 TD:  06/30/01 Job: [id number]

## 2011-03-01 NOTE — H&P (Signed)
[name], [name]               ACCOUNT NO.:  [id number]   MEDICAL RECORD NO.:  [id number]          PATIENT TYPE:  AMB   LOCATION:  SDS                          FACILITY:  [facility]   PHYSICIAN:  Sanjeev K. Deveshwar, M.D.DATE OF BIRTH:  11/21/1926   DATE OF ADMISSION:  08/23/2005  DATE OF DISCHARGE:                                HISTORY & PHYSICAL   BRIEF HISTORY:  This is a pleasant 77 year old female referred to Dr.  Corliss Skains from Dr. Vickey Huger for evaluation of a T12 kyphoplasty.  Dr.  Corliss Skains saw the patient in consultation on August 22, 2005.  Apparently  she had onset of severe low back pain approximately 11 months ago following  a colonoscopy.  Dr. Corliss Skains felt that she would benefit from a T12  kyphoplasty, and the patient is here today for that procedure.   PAST MEDICAL HISTORY:  Significant for polymyalgia rheumatic,  hypercholesterolemia, gastroesophageal reflux disease, and restless legs  syndrome.   ALLERGIES:  No known drug allergies.   CURRENT MEDICATIONS:  1.  Aspirin 81 mg daily.  2.  Vytorin 10/10 mg one daily.  3.  Requip 0.5 mg at lunch and 1 mg at bedtime.  4.  Clonazepam 0.5 mg at bedtime.   PAST SURGICAL HISTORY:  Significant for a hysterectomy, appendectomy and  hemorrhoid surgery.   SOCIAL HISTORY:  The patient is married.  She lives in [place] with her  husband.  They have two children.  She has never used alcohol or tobacco.  She is retired.   FAMILY HISTORY:  The patient's father died at age 13 from pneumonia.  He had  kidney problems.  Her mother died at age 62 from an MI.   REVIEW OF SYSTEMS:  Completely negative except for the above-noted back  pain.  The patient wears contact lenses.  She notes easy bruising.  Otherwise, review of systems was unremarkable.   LABORATORY DATA:  An INR of 0.9.  PTT is 27.  CBC revealed hemoglobin of  13.3, hematocrit 39.4, WBC 6.4 thousand, platelets 234,000.  Chemistry  profile revealed BUN 17,  creatinine 0.9, potassium 4.3, glucose 103.   PHYSICAL EXAMINATION:  GENERAL:  A very pleasant, alert, oriented 78-year-  old white female in no acute distress.  VITAL SIGNS:  Blood pressure 143/92, pulse 78, respirations 16, temperature  96, oxygen saturation 95%.  HEENT:  Unremarkable.  NECK:  No bruits, no jugular venous distention.  CARDIAC:  Regular rate and rhythm with somewhat distant heart sounds.  CHEST:  Lungs are decreased but clear.  ABDOMEN:  Obese, soft, nontender.  EXTREMITIES:  Extremities reveal pulses to be intact without edema.  SKIN:  Warm and dry.  NEUROLOGIC:  Grossly intact.  Mental status:  The patient was alert and  oriented.  Cranial nerves II-XII grossly intact.  Sensation is intact to  light touch.  Motor strength 4/5 throughout.  Cerebellar testing is intact.   IMPRESSION:  1.  T12 compression fracture, for kyphoplasty today.  2.  History of polymyalgia rheumatica.  3.  History of elevated cholesterol.  4.  Gastroesophageal reflux disease.  5.  Restless legs syndrome.  6.  Status post multiple surgeries, including hysterectomy, appendectomy and      hemorrhoidectomy.  She has also had back surgery.   PLAN:  As noted, the patient will undergo kyphoplasty or vertebroplasty  today for treatment of her compression fracture.      Delton See, P.A.    ______________________________  Grandville Silos. Corliss Skains, M.D.    DR/MEDQ  D:  08/23/2005  T:  08/23/2005  Job:  [id number]   cc:   Tammy R. Collins Scotland, M.D.  Fax: [phone]   Melvyn Novas, M.D.  Fax: [phone]

## 2011-03-01 NOTE — Discharge Summary (Signed)
[facility]  Patient:    [name], CLECKLEY Visit Number: [id number] MRN: [id number]          Service Type: MED Location: 3W [phone] Attending Physician:  Gracelyn Nurse Dictated by:   Marcelino Duster, M.D. Admit Date:  06/30/2001 Discharge Date: 07/02/2001   CC:         Tammy R. Collins Scotland, M.D.   Discharge Summary  DISCHARGE DIAGNOSES: 1. Polymyalgia rheumatica. 2. Diarrhea. 3. Hypercholesterolemia. 4. Gastroesophageal reflux disease. 5. Restless leg syndrome. 6. Status post hysterectomy. 7. Status post appendectomy. 8. Status post hemorrhoidectomy.  DISCHARGE MEDICATIONS: 1. Prednisone 10 mg q.d. 2. Lipitor 10 mg q.d. 3. Premarin 0.625 mg q.d. 4. Zyrtec 10 mg q.d. 5. Calcium 600 mg q.d. 6. Diazepam 5 mg 1/2 tablet q.h.s. p.r.n. 7. Aspirin 81 mg q.d. 8. Nexium 40 mg q.d.  HISTORY OF PRESENT ILLNESS:  This is a 77 year old white female who presents with a two week history of fatigue and muscle pain.  Muscle pain started in her legs and is now in her hip and shoulder area.  She states that this happened about four days after starting Celebrex.  She stopped the Celebrex about one week ago, but the symptoms have persisted and according to her have even become worse.  She complains that she was so weak yesterday that she could hardly walk.  She also complains about having diarrhea over the past few days with black looking stools.  VITAL SIGNS:  Temperature 97.8, pulse 94, respirations 18, blood pressure 139/85.  PHYSICAL EXAMINATION:  GENERAL:  This is a well-nourished white female who appears mildly fatigue.  HEENT:  Pupils are equal, round and reactive to light.  Extraocular movements intact.  Sclera nonicteric.  Throat is clear.  Tympanic membranes clearly visible, nonbulging.  Oral mucosa is moist.  NECK:  There is no JVD, no thyromegaly, no lymphadenopathy.  CARDIOVASCULAR:  Regular rate and rhythm, no murmurs.  LUNGS:  Clear to  auscultation.  ABDOMEN:  Soft, nontender, nondistended, bowel sounds are positive.  EXTREMITIES:  There is no edema.  There is some mild tenderness in the hip girdle area.  NEUROLOGIC:  Strength is 5/5 bilateral.  Cranial nerves II-XII are grossly intact.  ADMITTING LABORATORY:  White blood cell count is 8.2, hemoglobin 12.6, platelets 384, urinalysis negative.  CK 41, sodium 138, potassium 3.7, chloride 102, CO2 30, BUN 19, creatinine 0.8, glucose 107, calcium 9.1, total protein 7.9, albumin 3.5, total bilirubin 0.6, alkaline phosphatase 105, SGPT 19, SGOT 22.  HOSPITAL COURSE: #1 - POLYMYALGIA RHEUMATICA:  The patient had a sed rate of 79 with her pain distribution in the hip girdle area, shoulder area and lower back.  This did fit a diagnosis of polymyalgia rheumatica.  She was started on steroids 10 mg q.d.  She had a dramatic response of just one day of steroids and this does help to confirm the diagnosis.  She will be discharged on prednisone 10 mg q.d.  This should be tapered by 10-20% each month to achieve the lowest possible dose that will cure symptoms.  #2 - DIARRHEA:  The patient had no diarrhea during hospital stay.  I was concerned about her describing diarrhea as black stools.  She was guaiac negative and her hemoglobin stayed stable.  May want to follow this up with serial stool cards as an outpatient.  DISPOSITION:  The patient is to follow up with Dr. Herb Grays at [facility] on September 26 at 11:40 p.m.  Dictated by:   Marcelino Duster, M.D. Attending Physician:  Marcelino Duster D DD:  07/02/01 TD:  07/02/01 Job: [id number]

## 2011-05-20 ENCOUNTER — Other Ambulatory Visit: Payer: Self-pay | Admitting: Family Medicine

## 2011-05-20 DIAGNOSIS — H3400 Transient retinal artery occlusion, unspecified eye: Secondary | ICD-10-CM

## 2011-05-20 DIAGNOSIS — H53129 Transient visual loss, unspecified eye: Secondary | ICD-10-CM

## 2011-05-21 ENCOUNTER — Encounter (INDEPENDENT_AMBULATORY_CARE_PROVIDER_SITE_OTHER): Payer: Medicare Other | Admitting: *Deleted

## 2011-05-21 DIAGNOSIS — H3400 Transient retinal artery occlusion, unspecified eye: Secondary | ICD-10-CM

## 2011-05-21 DIAGNOSIS — H53129 Transient visual loss, unspecified eye: Secondary | ICD-10-CM

## 2011-05-23 ENCOUNTER — Encounter: Payer: Self-pay | Admitting: Family Medicine

## 2011-06-12 ENCOUNTER — Encounter (INDEPENDENT_AMBULATORY_CARE_PROVIDER_SITE_OTHER): Payer: Self-pay | Admitting: General Surgery

## 2011-06-12 ENCOUNTER — Ambulatory Visit (INDEPENDENT_AMBULATORY_CARE_PROVIDER_SITE_OTHER): Payer: Medicare Other | Admitting: General Surgery

## 2011-06-12 VITALS — BP 122/88 | HR 80 | Temp 97.4°F | Ht 62.0 in | Wt 166.8 lb

## 2011-06-12 DIAGNOSIS — E042 Nontoxic multinodular goiter: Secondary | ICD-10-CM

## 2011-06-12 NOTE — Patient Instructions (Signed)
Call for concerns or questions. We will see back in 6 months with a repeat ultrasound of her thyroid gland.

## 2011-06-12 NOTE — Progress Notes (Signed)
Addended by: Maryan Puls on: 06/12/2011 05:32 PM   Modules accepted: Orders

## 2011-06-12 NOTE — Progress Notes (Signed)
Subjective:   Thyroid nodules  Patient ID: [name], female   DOB: 18-Nov-1926, 77 y.o.   MRN: [id number]  HPI The patient is a pleasant 77 year old female here through the courtesy of Dr. Collins Scotland for evaluation of thyroid nodules. She recently underwent a carotid ultrasound which was apparently negative for carotid disease but did reveal thyroid abnormalities. She then had a dedicated thyroid ultrasound which I have reviewed. This shows 2 adjacent mostly cystic nodules in the upper right pole largest measuring 1.5 cm. There is a solid 1 cm nodule near the isthmus. She has not noted any lumps or pressure symptoms or swallowing difficulties. She has no history of head or neck radiation or thyroid disease. She is here for further evaluation. Past Medical History  Diagnosis Date  . Arthritis   . GERD (gastroesophageal reflux disease)   . Hyperlipidemia   . Thyroid nodule   . Polymyalgia rheumatica    Past Surgical History  Procedure Date  . Back surgery     x2  . Cholecystectomy 1966  . Abdominal hysterectomy 1978  . Appendectomy 1948   Current Outpatient Prescriptions  Medication Sig Dispense Refill  . aspirin 81 MG tablet Take 81 mg by mouth daily.        . Calcium Carbonate-Vitamin D (CALCIUM + D PO) Take 600 mg by mouth 2 (two) times daily.        . Cholecalciferol (VITAMIN D3) 1000 UNITS CAPS Take by mouth daily.        . clonazePAM (KLONOPIN) 1 MG tablet daily.      Marland Kitchen gabapentin (NEURONTIN) 300 MG capsule Three times a day.      . Hydrocodone-Acetaminophen (VICODIN PO) Take by mouth as needed.        . Multiple Vitamin (ANTIOXIDANT PO) Take by mouth daily.        Marland Kitchen rOPINIRole (REQUIP) 1 MG tablet At bedtime. Pt takes .5 mg in am and 1 mg in pm       Allergies  Allergen Reactions  . Levofloxacin     REACTION: nausea      Review of Systems  HENT: Negative.   Respiratory: Negative.   Cardiovascular: Negative.   Musculoskeletal: Positive for myalgias and arthralgias.        Objective:   Physical Exam General: Alert, healthy-appearing elderly Caucasian female Skin: Warm and dry without rash or infection Lymph nodes: No cervical or supraclavicular nodes palpable HEENT: I cannot feel any thyroid nodules or enlargement.    Assessment:     Apparent multinodular goiter discovered incidentally on a carotid ultrasound. She is asymptomatic and has a normal exam. These nodules are very small. This is very unlikely to represent thyroid cancer    Plan:     I discussed with her options which included proceeding with fine needle aspiration biopsy of the solid nodule versus followup in 6 months with repeat ultrasound. I think simple observation with followup ultrasound would be very safe. This is what she would prefer. I will see her back in 6 months with a repeat ultrasound of her thyroid.

## 2011-06-12 NOTE — Progress Notes (Signed)
Addended by: Maryan Puls on: 06/12/2011 05:22 PM   Modules accepted: Orders

## 2011-06-13 LAB — TSH: TSH: 1.136 u[IU]/mL (ref 0.350–4.500)

## 2011-11-07 ENCOUNTER — Encounter (INDEPENDENT_AMBULATORY_CARE_PROVIDER_SITE_OTHER): Payer: Self-pay | Admitting: General Surgery

## 2011-12-09 ENCOUNTER — Telehealth (INDEPENDENT_AMBULATORY_CARE_PROVIDER_SITE_OTHER): Payer: Self-pay | Admitting: General Surgery

## 2011-12-13 NOTE — Telephone Encounter (Signed)
Patient scheduled for Ultrasound on 12/16/11 @ [facility]

## 2011-12-16 ENCOUNTER — Ambulatory Visit
Admission: RE | Admit: 2011-12-16 | Discharge: 2011-12-16 | Disposition: A | Discharge status: Home / Self Care | Payer: Medicare Other | Source: Ambulatory Visit | Attending: General Surgery | Admitting: General Surgery

## 2011-12-16 DIAGNOSIS — E042 Nontoxic multinodular goiter: Secondary | ICD-10-CM

## 2011-12-18 NOTE — Progress Notes (Signed)
Patient has follow up appointment for 01/16/12, does she need an earlier appointment or will this date be okay?

## 2012-01-16 ENCOUNTER — Ambulatory Visit (INDEPENDENT_AMBULATORY_CARE_PROVIDER_SITE_OTHER): Payer: Medicare Other | Admitting: General Surgery

## 2012-01-16 VITALS — BP 124/86 | HR 71 | Temp 97.1°F | Resp 16 | Ht 62.0 in | Wt 162.4 lb

## 2012-01-16 DIAGNOSIS — E041 Nontoxic single thyroid nodule: Secondary | ICD-10-CM | POA: Insufficient documentation

## 2012-01-16 NOTE — Progress Notes (Signed)
Chief complaint: Followup thyroid nodules  History: Patient returns for followup of thyroid nodules which were found incidentally on a carotid ultrasound. Her initial ultrasound last summer showed several nodules, mostly cystic, bilateral, with the largest measuring 1.5 cm. She now returns for followup having had a repeat thyroid ultrasound. She denies any neck pressure or lumps or difficulty swallowing.  Exam:  Gen.: Elderly but well-appearing female Lymph nodes: No cervical, supraclavicular nodes palpable HEENT: On swallowing I can feel a smooth round approximately 2 cm nodule on the right and the thyroid gland.  Imaging: Repeat thyroid ultrasound has shown some increase in her thyroid nodularity with now a dominant solid nodule in the mid to upper right thyroid lobe measuring a maximum of 2.4 cm.  Assessment and plan: Multinodular thyroid with some progression and now dominant solid nodule in the right lobe. I've recommended fine needle aspiration for further evaluation discussed this with the patient. She is in agreement and I will discuss the results and plan with her after this is completed.

## 2012-01-22 ENCOUNTER — Other Ambulatory Visit (HOSPITAL_COMMUNITY)
Admission: RE | Admit: 2012-01-22 | Discharge: 2012-01-22 | Disposition: A | Discharge status: Home / Self Care | Payer: Medicare Other | Source: Ambulatory Visit | Attending: Interventional Radiology | Admitting: Interventional Radiology

## 2012-01-22 ENCOUNTER — Ambulatory Visit
Admission: RE | Admit: 2012-01-22 | Discharge: 2012-01-22 | Disposition: A | Discharge status: Home / Self Care | Payer: Medicare Other | Source: Ambulatory Visit | Attending: General Surgery | Admitting: General Surgery

## 2012-01-22 DIAGNOSIS — E041 Nontoxic single thyroid nodule: Secondary | ICD-10-CM

## 2012-01-22 DIAGNOSIS — E049 Nontoxic goiter, unspecified: Secondary | ICD-10-CM | POA: Insufficient documentation

## 2012-02-07 ENCOUNTER — Ambulatory Visit (INDEPENDENT_AMBULATORY_CARE_PROVIDER_SITE_OTHER): Payer: Medicare Other | Admitting: General Surgery

## 2012-02-07 ENCOUNTER — Encounter (INDEPENDENT_AMBULATORY_CARE_PROVIDER_SITE_OTHER): Payer: Self-pay | Admitting: General Surgery

## 2012-02-07 VITALS — BP 141/80 | HR 78 | Temp 97.7°F | Resp 14 | Ht 62.0 in | Wt 161.4 lb

## 2012-02-07 DIAGNOSIS — E041 Nontoxic single thyroid nodule: Secondary | ICD-10-CM

## 2012-02-07 NOTE — Patient Instructions (Signed)
Options include thyroidectomy as we discussed versus followup with repeat ultrasound in 6 months and thyroidectomy if there is any further enlargement. You may call the office and speak to Dr. Johna Sheriff or Neysa Bonito with your decision

## 2012-02-07 NOTE — Progress Notes (Signed)
History: Patient returns for followup of her thyroid nodule. Fine needle aspiration biopsy was performed which was consistent with a benign Hurthle cell tumor but with this finding a papillary carcinoma could not be ruled out. I discussed this finding at length with the patient and her husband. With an indeterminate needle biopsy and definite enlargement of this nodule I would lean toward proceeding with thyroidectomy. She is understandably reluctant to have surgery with recent health problems she has been through. We discussed the option of followup with repeat ultrasound in 6 months and thyroidectomy if there is any further enlargement. This would have the disadvantage of allowing a potential malignancy to enlarge it would be unlikely to be life-threatening. We discussed thyroid surgery in detail including its nature and recovery and indications and risks of bleeding, infection, anesthetic complications, recurrent laryngeal nerve injury permanent hoarseness, and injury to the parathyroid glands or permanent hypocalcemia. They have literature regarding the procedure. She would like time to think over her options. She will call me next week. Examination was not performed today.

## 2012-02-10 ENCOUNTER — Telehealth (INDEPENDENT_AMBULATORY_CARE_PROVIDER_SITE_OTHER): Payer: Self-pay

## 2012-02-10 NOTE — Telephone Encounter (Signed)
Office notes, Thyroid u/s w/FNA results faxed to Dr. Collins Scotland @ patient request.

## 2012-02-17 ENCOUNTER — Other Ambulatory Visit (INDEPENDENT_AMBULATORY_CARE_PROVIDER_SITE_OTHER): Payer: Self-pay | Admitting: General Surgery

## 2012-03-10 ENCOUNTER — Encounter (HOSPITAL_COMMUNITY): Payer: Self-pay | Admitting: Pharmacy Technician

## 2012-03-12 ENCOUNTER — Encounter (HOSPITAL_COMMUNITY): Payer: Self-pay

## 2012-03-12 ENCOUNTER — Ambulatory Visit (HOSPITAL_COMMUNITY)
Admission: RE | Admit: 2012-03-12 | Discharge: 2012-03-12 | Disposition: A | Discharge status: Home / Self Care | Payer: Medicare Other | Source: Ambulatory Visit | Attending: General Surgery | Admitting: General Surgery

## 2012-03-12 ENCOUNTER — Encounter (HOSPITAL_COMMUNITY)
Admission: RE | Admit: 2012-03-12 | Discharge: 2012-03-12 | Disposition: A | Discharge status: Home / Self Care | Payer: Medicare Other | Source: Ambulatory Visit | Attending: General Surgery | Admitting: General Surgery

## 2012-03-12 DIAGNOSIS — E041 Nontoxic single thyroid nodule: Secondary | ICD-10-CM | POA: Insufficient documentation

## 2012-03-12 DIAGNOSIS — Z01812 Encounter for preprocedural laboratory examination: Secondary | ICD-10-CM | POA: Insufficient documentation

## 2012-03-12 DIAGNOSIS — Z01818 Encounter for other preprocedural examination: Secondary | ICD-10-CM | POA: Insufficient documentation

## 2012-03-12 HISTORY — PX: CATARACT EXTRACTION, BILATERAL: SHX1313

## 2012-03-12 LAB — BASIC METABOLIC PANEL
CO2: 32 mEq/L (ref 19–32)
Chloride: 99 mEq/L (ref 96–112)
GFR calc non Af Amer: 76 mL/min — ABNORMAL LOW (ref >90)
Glucose, Bld: 88 mg/dL (ref 70–99)
Potassium: 4 mEq/L (ref 3.5–5.1)
Sodium: 138 mEq/L (ref 135–145)

## 2012-03-12 LAB — CBC
Hemoglobin: 13.1 g/dL (ref 12.0–15.0)
MCH: 30 pg (ref 26.0–34.0)
MCV: 93.8 fL (ref 78.0–100.0)
RBC: 4.36 MIL/uL (ref 3.87–5.11)

## 2012-03-12 NOTE — Pre-Procedure Instructions (Signed)
03-12-12 EKG/ CXR done today,

## 2012-03-12 NOTE — Patient Instructions (Signed)
20 CLOA BUSHONG  03/12/2012   Your procedure is scheduled on:  6-4 -2013  Report to [facility] at        [phone] / PM.  Call this number if you have problems the morning of surgery: [phone]   Remember:   Do not eat food:After Midnight.     Take these medicines the morning of surgery with A SIP OF WATER: none   Do not wear jewelry, make-up or nail polish.  Do not wear lotions, powders, or perfumes. You may wear deodorant.  Do not shave 48 hours prior to surgery.(face and neck okay, no shaving of legs)  Do not bring valuables to the hospital.  Contacts, dentures or bridgework may not be worn into surgery.  Leave suitcase in the car. After surgery it may be brought to your room.  For patients admitted to the hospital, checkout time is 11:00 AM the day of discharge.   Patients discharged the day of surgery will not be allowed to drive home.  Name and phone number of your driver:   Special Instructions: CHG Shower Use Special Wash: 1/2 bottle night before surgery and 1/2 bottle morning of surgery.(avoid face and genitals)   Please read over the following fact sheets that you were given: MRSA Information.

## 2012-03-17 ENCOUNTER — Encounter (HOSPITAL_COMMUNITY): Payer: Self-pay | Admitting: Anesthesiology

## 2012-03-17 ENCOUNTER — Encounter (HOSPITAL_COMMUNITY): Payer: Self-pay | Admitting: *Deleted

## 2012-03-17 ENCOUNTER — Ambulatory Visit (HOSPITAL_COMMUNITY): Payer: Medicare Other | Admitting: Anesthesiology

## 2012-03-17 ENCOUNTER — Ambulatory Visit (HOSPITAL_COMMUNITY)
Admission: RE | Admit: 2012-03-17 | Discharge: 2012-03-18 | Disposition: A | Discharge status: Home / Self Care | Payer: Medicare Other | Source: Ambulatory Visit | Attending: General Surgery | Admitting: General Surgery

## 2012-03-17 ENCOUNTER — Encounter (HOSPITAL_COMMUNITY): Payer: Self-pay | Admitting: General Practice

## 2012-03-17 ENCOUNTER — Encounter (HOSPITAL_COMMUNITY)
Admission: RE | Disposition: A | Discharge status: Home / Self Care | Payer: Self-pay | Source: Ambulatory Visit | Attending: General Surgery

## 2012-03-17 DIAGNOSIS — M353 Polymyalgia rheumatica: Secondary | ICD-10-CM | POA: Insufficient documentation

## 2012-03-17 DIAGNOSIS — E042 Nontoxic multinodular goiter: Secondary | ICD-10-CM | POA: Insufficient documentation

## 2012-03-17 DIAGNOSIS — E041 Nontoxic single thyroid nodule: Secondary | ICD-10-CM

## 2012-03-17 DIAGNOSIS — C73 Malignant neoplasm of thyroid gland: Secondary | ICD-10-CM | POA: Insufficient documentation

## 2012-03-17 DIAGNOSIS — Z79899 Other long term (current) drug therapy: Secondary | ICD-10-CM | POA: Insufficient documentation

## 2012-03-17 DIAGNOSIS — K219 Gastro-esophageal reflux disease without esophagitis: Secondary | ICD-10-CM | POA: Insufficient documentation

## 2012-03-17 DIAGNOSIS — E785 Hyperlipidemia, unspecified: Secondary | ICD-10-CM | POA: Insufficient documentation

## 2012-03-17 HISTORY — PX: THYROID LOBECTOMY: SHX420

## 2012-03-17 HISTORY — PX: THYROIDECTOMY: SHX17

## 2012-03-17 LAB — CALCIUM: Calcium: 9.6 mg/dL (ref 8.4–10.5)

## 2012-03-17 SURGERY — LOBECTOMY, THYROID
Anesthesia: General | Site: Neck | Laterality: Right | Wound class: Clean

## 2012-03-17 MED ORDER — LACTATED RINGERS IV SOLN
INTRAVENOUS | Status: DC | PRN
  Administered 2012-03-17 (×2): via INTRAVENOUS

## 2012-03-17 MED ORDER — GLYCOPYRROLATE 0.2 MG/ML IJ SOLN
INTRAMUSCULAR | Status: DC | PRN
  Administered 2012-03-17: 0.6 mg via INTRAVENOUS

## 2012-03-17 MED ORDER — DEXAMETHASONE SODIUM PHOSPHATE 10 MG/ML IJ SOLN
INTRAMUSCULAR | Status: DC | PRN
  Administered 2012-03-17: 10 mg via INTRAVENOUS

## 2012-03-17 MED ORDER — MORPHINE SULFATE 10 MG/ML IJ SOLN
INTRAMUSCULAR | Status: AC
  Filled 2012-03-17: qty 1

## 2012-03-17 MED ORDER — OXYCODONE-ACETAMINOPHEN 5-325 MG PO TABS
1.0000 | ORAL_TABLET | ORAL | Status: DC | PRN
  Administered 2012-03-18 (×2): 1 via ORAL
  Filled 2012-03-17 (×2): qty 1

## 2012-03-17 MED ORDER — CISATRACURIUM BESYLATE (PF) 10 MG/5ML IV SOLN
INTRAVENOUS | Status: DC | PRN
  Administered 2012-03-17: 3 mg via INTRAVENOUS
  Administered 2012-03-17: 8 mg via INTRAVENOUS

## 2012-03-17 MED ORDER — PROMETHAZINE HCL 25 MG/ML IJ SOLN: 6.2500 mg | INTRAMUSCULAR | Status: DC | PRN

## 2012-03-17 MED ORDER — GABAPENTIN 300 MG PO CAPS
300.0000 mg | ORAL_CAPSULE | Freq: Two times a day (BID) | ORAL | Status: DC
Start: 2012-03-17 — End: 2012-03-18
  Administered 2012-03-17 (×2): 300 mg via ORAL
  Filled 2012-03-17 (×4): qty 1

## 2012-03-17 MED ORDER — MORPHINE SULFATE 10 MG/ML IJ SOLN
2.0000 mg | INTRAMUSCULAR | Status: DC | PRN
  Administered 2012-03-17 (×4): 2 mg via INTRAVENOUS
  Filled 2012-03-17: qty 1

## 2012-03-17 MED ORDER — CEFAZOLIN SODIUM 1-5 GM-% IV SOLN
1.0000 g | INTRAVENOUS | Status: AC
  Administered 2012-03-17: 1 g via INTRAVENOUS

## 2012-03-17 MED ORDER — FENTANYL CITRATE 0.05 MG/ML IJ SOLN
INTRAMUSCULAR | Status: AC
  Filled 2012-03-17: qty 2

## 2012-03-17 MED ORDER — ROPINIROLE HCL 0.5 MG PO TABS
0.5000 mg | ORAL_TABLET | Freq: Two times a day (BID) | ORAL | Status: DC
  Administered 2012-03-17: 1 mg via ORAL
  Administered 2012-03-17: 0.5 mg via ORAL
  Filled 2012-03-17 (×4): qty 2

## 2012-03-17 MED ORDER — ONDANSETRON HCL 4 MG/2ML IJ SOLN
INTRAMUSCULAR | Status: DC | PRN
  Administered 2012-03-17: 4 mg via INTRAVENOUS

## 2012-03-17 MED ORDER — PROPOFOL 10 MG/ML IV EMUL
INTRAVENOUS | Status: DC | PRN
  Administered 2012-03-17: 100 mg via INTRAVENOUS

## 2012-03-17 MED ORDER — 0.9 % SODIUM CHLORIDE (POUR BTL) OPTIME
TOPICAL | Status: DC | PRN
  Administered 2012-03-17: 1000 mL

## 2012-03-17 MED ORDER — ETOMIDATE 2 MG/ML IV SOLN
INTRAVENOUS | Status: DC | PRN
  Administered 2012-03-17: 10 mg via INTRAVENOUS

## 2012-03-17 MED ORDER — NEOSTIGMINE METHYLSULFATE 1 MG/ML IJ SOLN
INTRAMUSCULAR | Status: DC | PRN
  Administered 2012-03-17: 4 mg via INTRAVENOUS

## 2012-03-17 MED ORDER — FENTANYL CITRATE 0.05 MG/ML IJ SOLN
INTRAMUSCULAR | Status: DC | PRN
  Administered 2012-03-17 (×2): 50 ug via INTRAVENOUS
  Administered 2012-03-17: 100 ug via INTRAVENOUS

## 2012-03-17 MED ORDER — LACTATED RINGERS IV SOLN
INTRAVENOUS | Status: DC
Start: 2012-03-17 — End: 2012-03-17

## 2012-03-17 MED ORDER — KCL IN DEXTROSE-NACL 20-5-0.9 MEQ/L-%-% IV SOLN
INTRAVENOUS | Status: DC
  Administered 2012-03-17: 14:00:00 via INTRAVENOUS
  Filled 2012-03-17 (×2): qty 1000

## 2012-03-17 MED ORDER — CEFAZOLIN SODIUM 1-5 GM-% IV SOLN
INTRAVENOUS | Status: AC
  Filled 2012-03-17: qty 50

## 2012-03-17 MED ORDER — FENTANYL CITRATE 0.05 MG/ML IJ SOLN
25.0000 ug | INTRAMUSCULAR | Status: DC | PRN
  Administered 2012-03-17 (×3): 50 ug via INTRAVENOUS

## 2012-03-17 SURGICAL SUPPLY — 43 items
ADH SKN CLS APL DERMABOND .7 (GAUZE/BANDAGES/DRESSINGS) ×4
APL SKNCLS STERI-STRIP NONHPOA (GAUZE/BANDAGES/DRESSINGS) ×2
ATTRACTOMAT 16X20 MAGNETIC DRP (DRAPES) ×3 IMPLANT
BENZOIN TINCTURE PRP APPL 2/3 (GAUZE/BANDAGES/DRESSINGS) ×3 IMPLANT
BLADE HEX COATED 2.75 (ELECTRODE) ×3 IMPLANT
BLADE SURG 15 STRL LF DISP TIS (BLADE) ×2 IMPLANT
BLADE SURG 15 STRL SS (BLADE) ×3
BLADE SURG SZ10 CARB STEEL (BLADE) ×3 IMPLANT
CANISTER SUCTION 2500CC (MISCELLANEOUS) ×3 IMPLANT
CLIP TI WIDE RED SMALL 6 (CLIP) ×8 IMPLANT
CLOTH BEACON ORANGE TIMEOUT ST (SAFETY) ×3 IMPLANT
DERMABOND ADVANCED (GAUZE/BANDAGES/DRESSINGS) ×2
DERMABOND ADVANCED .7 DNX12 (GAUZE/BANDAGES/DRESSINGS) IMPLANT
DISSECTOR ROUND CHERRY 3/8 STR (MISCELLANEOUS) ×3 IMPLANT
DRAPE PED LAPAROTOMY (DRAPES) ×3 IMPLANT
DRESSING SURGICEL FIBRLLR 1X2 (HEMOSTASIS) IMPLANT
DRSG SURGICEL FIBRILLAR 1X2 (HEMOSTASIS) ×3
ELECT COATED BLADE 2.86 ST (ELECTRODE) ×3 IMPLANT
ELECT REM PT RETURN 9FT ADLT (ELECTROSURGICAL) ×3
ELECTRODE REM PT RTRN 9FT ADLT (ELECTROSURGICAL) ×2 IMPLANT
GAUZE SPONGE 4X4 16PLY XRAY LF (GAUZE/BANDAGES/DRESSINGS) ×4 IMPLANT
GOWN STRL NON-REIN LRG LVL3 (GOWN DISPOSABLE) ×5 IMPLANT
GOWN STRL REIN XL XLG (GOWN DISPOSABLE) ×7 IMPLANT
HEMOSTAT SURGICEL 2X14 (HEMOSTASIS) IMPLANT
KIT BASIN OR (CUSTOM PROCEDURE TRAY) ×3 IMPLANT
NS IRRIG 1000ML POUR BTL (IV SOLUTION) ×3 IMPLANT
PACK BASIC VI WITH GOWN DISP (CUSTOM PROCEDURE TRAY) ×3 IMPLANT
PEN SKIN MARKING BROAD (MISCELLANEOUS) ×2 IMPLANT
PENCIL BUTTON HOLSTER BLD 10FT (ELECTRODE) ×3 IMPLANT
SHEARS FOC LG CVD HARMONIC 17C (MISCELLANEOUS) ×3 IMPLANT
SPONGE GAUZE 4X4 12PLY (GAUZE/BANDAGES/DRESSINGS) ×2 IMPLANT
STAPLER VISISTAT 35W (STAPLE) ×3 IMPLANT
SUT MNCRL AB 4-0 PS2 18 (SUTURE) ×2 IMPLANT
SUT SILK 2 0 (SUTURE) ×3
SUT SILK 2-0 18XBRD TIE 12 (SUTURE) ×2 IMPLANT
SUT SILK 3 0 (SUTURE)
SUT SILK 3-0 18XBRD TIE 12 (SUTURE) IMPLANT
SUT VIC AB 3-0 SH 18 (SUTURE) ×4 IMPLANT
SUT VICRYL 2 0 18  UND BR (SUTURE)
SUT VICRYL 2 0 18 UND BR (SUTURE) IMPLANT
SYR BULB IRRIGATION 50ML (SYRINGE) ×3 IMPLANT
TOWEL OR 17X26 10 PK STRL BLUE (TOWEL DISPOSABLE) ×3 IMPLANT
YANKAUER SUCT BULB TIP 10FT TU (MISCELLANEOUS) ×3 IMPLANT

## 2012-03-17 NOTE — Transfer of Care (Signed)
Immediate Anesthesia Transfer of Care Note  Patient: [name]  Procedure(s) Performed: Procedure(s) (LRB): THYROID LOBECTOMY (Right) THYROIDECTOMY (N/A)  Patient Location: PACU  Anesthesia Type: General  Level of Consciousness: awake, sedated and patient cooperative  Airway & Oxygen Therapy: Patient Spontanous Breathing and Patient connected to face mask oxygen  Post-op Assessment: Report given to PACU RN and Post -op Vital signs reviewed and stable  Post vital signs: Reviewed and stable  Complications: No apparent anesthesia complications

## 2012-03-17 NOTE — Op Note (Signed)
Preoperative Diagnosis: thyroid nodule   Postoprative Diagnosis: Follicular cancer thyroid  Procedure: Procedure(s): Total thyroidectomy   Surgeon: Glenna Fellows T   Assistants: Darnell Level M.D.  Anesthesia:  General endotracheal anesthesiaDiagnos  Indications:   Patient is an 78 year old female who incidentally had a thyroid nodule discovered on carotid ultrasound in the right upper lobe. On short term followup is enlarged from 1.5-2.4 cm and fine-needle aspiration showed Hrthle cells. I have recommended proceeding with thyroid lobectomy and possible total thyroidectomy. We have discussed the indications for the procedure, alternatives, and risks in detail documented elsewhere.  Procedure Detail:  Patient is brought to the operating room, placed in the supine position on the operating table, and general endotracheal anesthesia induced.  The neck was widely sterilely prepped and draped. Patient timeout was performed and correct procedure verified. A transverse incision was made in a skin crease 2 fingerbreadths above the sternal notch and dissection carried down through the subcutaneous tissue and platysma with cautery. The subcutaneous platysmal flaps were raised superiorly to the thyroid cartilage, inferiorly to the sternal notch and laterally out to the sternocleidomastoid muscles. The Mahorner retractor was placed. The strap muscles were divided along the midline with cautery and dissection carried down to the thyroid capsule. The right lobe was approached initially. The anterior lobe was exposed with cautery and the strap muscles retracted laterally. There was a firm nodule in the upper pole and a smaller firm nodule in the lower pole. Using blunt dissection the lateral and inferior gland was mobilized and individual vascular branches were divided with the harmonic scalpel with a proximal clip on the larger branches. The upper pole was then carefully exposed and with blunt dissection  the upper pole vessels were isolated and individually proximally clipped and divided with Harmonic scalpel the upper pole completely mobilized. The inferior vascular pedicle was then carefully exposed and staying right on the capsule of the thyroid small individual branches were divided with harmonic scalpel the proximal clips. The recurrent laryngeal nerve was identified in the tracheoesophageal groove and we stayed anterior to this and was protected. The gland was mobilized up out of the groove again staying right on the capsule and finally the suspensory ligament of Gery Pray was divided with cautery and the isthmus was divided with the harmonic scalpel. We had seen a superior parathyroid gland near the upper pole which was preserved but did not clearly see an inferior parathyroid gland. The right lobe was sent for frozen section which revealed a follicular neoplasm with capsular and vascular invasion which appeared malignant. I therefore planned proceeded with total thyroidectomy. The left lobe was entirely normal to palpation and soft. It was dissected in an identical fashion to the right. The superior parathyroid gland was clearly identified although again I could not clearly see an inferior parathyroid gland. It was protected. The recurrent laryngeal nerve was clearly identified on the left and protected and we stayed anterior to this. The left lobe was sent for permanent section. The neck was irrigated and complete hemostasis assured. Fibrilar was placed in the thyroid bed. The strap muscles were closed in the midline with interrupted Vicryl. The platysma was closed with interrupted Vicryl. The skin was closed with subcuticular Monocryl and Dermabond. Sponge needle and instrument counts were correct.  Findings: As above  Estimated Blood Loss:  Minimal         Drains: None  Blood Given: none          Specimens: Total thyroid  Complications:  * No complications entered in OR log *           Disposition: PACU - hemodynamically stable.         Condition: stable  Mariella Saa MD, FACS  03/17/2012, 9:55 AM

## 2012-03-17 NOTE — H&P (Signed)
  History: Patient presents for thyroidectomy for thyroid nodule. thyroid nodules were found incidentally on a carotid ultrasound. Her initial ultrasound last summer showed several nodules, mostly cystic, bilateral, with the largest measuring 1.5 cm. Subsequent F/U US showed elargement to 2.4 cm with indeterminate FNA  Past Medical History  Diagnosis Date  . Arthritis   . GERD (gastroesophageal reflux disease)   . Hyperlipidemia   . Thyroid nodule   . Polymyalgia rheumatica    Past Surgical History  Procedure Date  . Back surgery     x2  . Cholecystectomy 1966  . Abdominal hysterectomy 1978  . Appendectomy 1948  . Cataract extraction, bilateral 03-12-12    bilateral   Current Facility-Administered Medications  Medication Dose Route Frequency Provider Last Rate Last Dose  . ceFAZolin (ANCEF) IVPB 1 g/50 mL premix  1 g Intravenous 60 min Pre-Op Mariella Saa, MD       Facility-Administered Medications Ordered in Other Encounters  Medication Dose Route Frequency Provider Last Rate Last Dose  . lactated ringers infusion    Continuous PRN Elesa Massed, CRNA       Allergies  Allergen Reactions  . Levofloxacin     REACTION: nausea    Exam:  BP 147/93  Pulse 77  Temp(Src) 97.1 F (36.2 C) (Oral)  Resp 16  SpO2 94%  Gen.: Elderly but well-appearing female  Lymph nodes: No cervical, supraclavicular nodes palpable  HEENT: On swallowing I can feel a smooth round approximately 2 cm nodule on the right and the thyroid gland.  CHest: Clear CV RRR without edema  Imaging: Repeat thyroid ultrasound has shown some increase in her thyroid nodularity with now a dominant solid nodule in the mid to upper right thyroid lobe measuring a maximum of 2.4 cm.  A/P For right thyroid lobectomy and possible total thyroidectomy  Mariella Saa MD, FACS  03/17/2012, 7:19 AM

## 2012-03-17 NOTE — Anesthesia Postprocedure Evaluation (Signed)
Anesthesia Post Note  Patient: [name]  Procedure(s) Performed: Procedure(s) (LRB): THYROID LOBECTOMY (Right) THYROIDECTOMY (N/A)  Anesthesia type: General  Patient location: PACU  Post pain: Pain level controlled  Post assessment: Post-op Vital signs reviewed  Last Vitals:  Filed Vitals:   03/17/12 1115  BP: 165/90  Pulse: 61  Temp:   Resp: 14    Post vital signs: Reviewed  Level of consciousness: sedated  Complications: No apparent anesthesia complications

## 2012-03-17 NOTE — Anesthesia Preprocedure Evaluation (Signed)
Anesthesia Evaluation  Patient identified by MRN, date of birth, ID band Patient awake    Reviewed: Allergy & Precautions, H&P , NPO status , Patient's Chart, lab work & pertinent test results  Airway Mallampati: II TM Distance: >3 FB Neck ROM: Full    Dental  (+) Teeth Intact, Caps and Dental Advisory Given   Pulmonary neg pulmonary ROS,  breath sounds clear to auscultation  Pulmonary exam normal       Cardiovascular negative cardio ROS  Rhythm:Regular Rate:Normal     Neuro/Psych negative neurological ROS  negative psych ROS   GI/Hepatic negative GI ROS, Neg liver ROS, GERD-  ,  Endo/Other  negative endocrine ROS  Renal/GU negative Renal ROS  negative genitourinary   Musculoskeletal negative musculoskeletal ROS (+)   Abdominal   Peds  Hematology negative hematology ROS (+)   Anesthesia Other Findings Multiple bridge work throughout oropharynx  Reproductive/Obstetrics negative OB ROS                           Anesthesia Physical Anesthesia Plan  ASA: II  Anesthesia Plan: General   Post-op Pain Management:    Induction: Intravenous  Airway Management Planned: Oral ETT  Additional Equipment:   Intra-op Plan:   Post-operative Plan: Extubation in OR  Informed Consent: I have reviewed the patients History and Physical, chart, labs and discussed the procedure including the risks, benefits and alternatives for the proposed anesthesia with the patient or authorized representative who has indicated his/her understanding and acceptance.   Dental advisory given  Plan Discussed with: CRNA  Anesthesia Plan Comments:         Anesthesia Quick Evaluation

## 2012-03-18 ENCOUNTER — Encounter (HOSPITAL_COMMUNITY): Payer: Self-pay | Admitting: General Surgery

## 2012-03-18 LAB — CALCIUM: Calcium: 9.5 mg/dL (ref 8.4–10.5)

## 2012-03-18 MED ORDER — OXYCODONE-ACETAMINOPHEN 5-325 MG PO TABS: 1.0000 | ORAL_TABLET | ORAL | Status: AC | PRN

## 2012-03-18 MED ORDER — LEVOTHYROXINE SODIUM 75 MCG PO TABS: 75.0000 ug | ORAL_TABLET | Freq: Every day | ORAL | Status: DC

## 2012-03-18 NOTE — Progress Notes (Signed)
Patient ID: [name], female   DOB: March 14, 1927, 78 y.o.   MRN: [id number] 1 Day Post-Op  Subjective: No complaints except couldn't sleep well  Objective: Vital signs in last 24 hours: Temp:  [97 F (36.1 C)-98.1 F (36.7 C)] 97.8 F (36.6 C) (06/05 0605) Pulse Rate:  [59-90] 84  (06/05 0605) Resp:  [8-17] 16  (06/05 0605) BP: (143-178)/(68-90) 144/68 mmHg (06/05 0605) SpO2:  [94 %-100 %] 96 % (06/05 0605) Weight:  [163 lb (73.936 kg)] 163 lb (73.936 kg) (06/04 1323) Last BM Date: 03/16/12  Intake/Output from previous day: 06/04 0701 - 06/05 0700 In: 2636.7 [P.O.:440; I.V.:2196.7] Out: 2050 [Urine:2050] Intake/Output this shift:    Incision/Wound: clean and dry without swelling.  Lab Results:  No results found for this basename: WBC:2,HGB:2,HCT:2,PLT:2 in the last 72 hours BMET  Basename 03/18/12 0420 03/17/12 1818  NA -- --  K -- --  CL -- --  CO2 -- --  GLUCOSE -- --  BUN -- --  CREATININE -- --  CALCIUM 9.5 9.6     Studies/Results: No results found.  Anti-infectives: Anti-infectives     Start     Dose/Rate Route Frequency Ordered Stop   03/17/12 0602   ceFAZolin (ANCEF) IVPB 1 g/50 mL premix        1 g 100 mL/hr over 30 Minutes Intravenous 60 min pre-op 03/17/12 0602 03/17/12 0740          Assessment/Plan: s/p Procedure(s): THYROID LOBECTOMY THYROIDECTOMY Doing very well following total thyroidectomy without complication identified. Okay for discharge today appear   LOS: 1 day    Kadarrius Yanke T 03/18/2012

## 2012-03-18 NOTE — Discharge Summary (Signed)
   Patient ID: [name] [id number] 78 y.o. 12-Feb-1927  03/17/2012  Discharge date and time: 03/18/2012   Admitting Physician: Glenna Fellows T  Discharge Physician: Glenna Fellows T  Admission Diagnoses: thyroid nodule   Discharge Diagnoses: thyroid cancer  Operations: Procedure(s): THYROID LOBECTOMY THYROIDECTOMY  Admission Condition: good  Discharged Condition: good  Indication for Admission: the patient is an 78 year old female who several months ago on a carotid ultrasound was found to have a 1.5 cm right thyroid nodule. This was followed clinically and on repeat ultrasound recently it has enlarged to 2.4 cm. Fine-needle aspiration revealed Hurthle cells. I recommend proceeding with thyroid lobectomy and possible total thyroidectomy.  Hospital Course: patient was admitted on the morning of her procedure. She underwent right thyroid lobectomy with frozen section showing capsular and vascular invasion consistent with a follicular carcinoma. She underwent total thyroidectomy. On the first postoperative day she had no difficulties. Calcium was 9.6. Wound is healing well. She is ready for discharge. She is being started on Synthroid 75 mcg daily initially.    Disposition: Home  Patient Instructions:   [name], [name]  Home Medication Instructions [id number]   Printed on:03/18/12 9562  Medication Information                    gabapentin (NEURONTIN) 300 MG capsule Take 300 mg by mouth Three times a day. Pt. Takes all between hours 6:00p and 10:00 pm           rOPINIRole (REQUIP) 1 MG tablet Take 0.5-1 mg by mouth 2 (two) times daily. Pt takes .5 mg in am and 1 mg in pm           aspirin 81 MG tablet Take 81 mg by mouth daily with breakfast.            Calcium Carbonate-Vitamin D (CALCIUM + D PO) Take 600 mg by mouth daily with breakfast.            Cholecalciferol (VITAMIN D3) 1000 UNITS CAPS Take 1,000 Units by mouth daily.            fish oil-omega-3  fatty acids 1000 MG capsule Take 2 g by mouth daily with breakfast.           folic acid (FOLVITE) 800 MCG tablet Take 400 mcg by mouth daily with breakfast.           oxyCODONE-acetaminophen (PERCOCET) 5-325 MG per tablet Take 1-2 tablets by mouth every 4 (four) hours as needed.           levothyroxine (SYNTHROID) 75 MCG tablet Take 1 tablet (75 mcg total) by mouth daily.             Activity: activity as tolerated Diet: regular diet Wound Care: none needed  Follow-up:  With Dr. Johna Sheriff in 3 weeks.  Signed: Mariella Saa MD, FACS  03/18/2012, 8:19 AM

## 2012-03-18 NOTE — Discharge Instructions (Signed)
[facility]      [facility] °[phone] ° °THYROID/ PARATHYROID SURGERY: POST OP INSTRUCTIONS ° °Always review your discharge instruction sheet given to you by the facility where your surgery was performed. ° °IF YOU HAVE DISABILITY OR FAMILY LEAVE FORMS, YOU MUST BRING THEM TO THE OFFICE FOR PROCESSING.  PLEASE DO NOT GIVE THEM TO YOUR DOCTOR. ° °1. A prescription for pain medication may be given to you upon discharge.  Take your pain medication as prescribed, if needed.  If narcotic pain medicine is not needed, then you may take acetaminophen (Tylenol) or ibuprofen (Advil) as needed. °2. Take your usually prescribed medications unless otherwise directed. °3. If you need a refill on your pain medication, please contact your pharmacy. They will contact our office to request authorization.  Prescriptions will not be filled after 5pm or on week-ends. °4. You should follow a light diet the first 24 hours after arrival home, such as soup and crackers, etc.  Be sure to include lots of fluids daily.  Resume your normal diet the day after surgery. °5. Most patients will experience some swelling and bruising on the chest and neck area.  Ice packs will help.  Swelling and bruising can take several days to resolve.  °6. It is common to experience some constipation if taking pain medication after surgery.  Increasing fluid intake and taking a stool softener will usually help or prevent this problem from occurring.  A mild laxative (Milk of Magnesia or Miralax) should be taken according to package directions if there are no bowel movements after 48 hours. °7. Unless discharge instructions indicate otherwise, you may remove your bandages 24-48 hours after surgery, and you may shower at that time.  You may have steri-strips (small skin tapes) in place directly over the incision.  These strips should be left on the skin for 7-10 days.  If your surgeon used skin glue on the incision, you may shower in 24 hours.  The  glue will flake off over the next 2-3 weeks.  Any sutures or staples will be removed at the office during your follow-up visit. °8. ACTIVITIES:  You may resume regular (light) daily activities beginning the next day--such as daily self-care, walking, climbing stairs--gradually increasing activities as tolerated.  You may have sexual intercourse when it is comfortable.  Refrain from any heavy lifting or straining until approved by your doctor. °a. You may drive when you no longer are taking prescription pain medication, you can comfortably wear a seatbelt, and you can safely maneuver your car and apply brakes °b. RETURN TO WORK:  __________________________________________________________ °9. You should see your doctor in the office for a follow-up appointment approximately two weeks after your surgery.  Make sure that you call for this appointment within a day or two after you arrive home to insure a convenient appointment time. °10. OTHER INSTRUCTIONS: ____________________________________________________________________________ _________________________________________________________________________________________________________________ °_________________________________________________________________________________________________________________ ° ° °WHEN TO CALL YOUR DOCTOR: °1. Fever over 101.0 °2. Inability to urinate °3. Nausea and/or vomiting °4. Extreme swelling or bruising °5. Continued bleeding from incision. °6. Increased pain, redness, or drainage from the incision. °7. Difficulty swallowing or breathing °8. Muscle cramping or spasms. °9. Numbness or tingling in hands or feet or around lips. ° °The clinic staff is available to answer your questions during regular business hours.  Please don’t hesitate to call and ask to speak to one of the nurses if you have concerns. ° °For further questions, please visit [email or web address] °

## 2012-03-20 ENCOUNTER — Telehealth (INDEPENDENT_AMBULATORY_CARE_PROVIDER_SITE_OTHER): Payer: Self-pay

## 2012-03-20 NOTE — Telephone Encounter (Signed)
Called patient with post op appt date & time for Tuesday 04/14/12 @ 10:40 am.  Patient reports having a sore throat, that seem's to be giving her the most discomfort.  Patient would like her path results.  I informed [name] that we would call her back after results are reviewed.

## 2012-04-14 ENCOUNTER — Telehealth (INDEPENDENT_AMBULATORY_CARE_PROVIDER_SITE_OTHER): Payer: Self-pay

## 2012-04-14 ENCOUNTER — Other Ambulatory Visit (INDEPENDENT_AMBULATORY_CARE_PROVIDER_SITE_OTHER): Payer: Self-pay | Admitting: General Surgery

## 2012-04-14 ENCOUNTER — Other Ambulatory Visit (INDEPENDENT_AMBULATORY_CARE_PROVIDER_SITE_OTHER): Payer: Self-pay

## 2012-04-14 ENCOUNTER — Ambulatory Visit (INDEPENDENT_AMBULATORY_CARE_PROVIDER_SITE_OTHER): Payer: Medicare Other | Admitting: General Surgery

## 2012-04-14 ENCOUNTER — Encounter (INDEPENDENT_AMBULATORY_CARE_PROVIDER_SITE_OTHER): Payer: Self-pay | Admitting: General Surgery

## 2012-04-14 VITALS — BP 128/98 | HR 81 | Temp 97.0°F | Ht 62.0 in | Wt 161.0 lb

## 2012-04-14 DIAGNOSIS — C73 Malignant neoplasm of thyroid gland: Secondary | ICD-10-CM

## 2012-04-14 NOTE — Progress Notes (Signed)
History: Patient returns for her first postoperative visit following total thyroidectomy for what proved to be a 2.3 cm follicular carcinoma with vascular invasion. There were no lymph nodes enlarged. She has done well. No complaints postoperatively. She is on Synthroid 75 mcg daily without symptoms of hypo-or hyperthyroidism.  Exam: She appears well. Her incision is healing nicely without complication. 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 We discussed the diagnosis again today. We discussed this in the hospital but she did not quite realized that this was thyroid cancer. I have recommended referral for I-131 scanning and treatment and we discussed this and she is in agreement. I will check a TSH today. Return in 6 weeks

## 2012-04-14 NOTE — Telephone Encounter (Signed)
I 131 Scan & Treatment orders, office notes, operative report & pathology report faxed to [facility] for review and scheduling [phone])

## 2012-04-24 ENCOUNTER — Telehealth (INDEPENDENT_AMBULATORY_CARE_PROVIDER_SITE_OTHER): Payer: Self-pay

## 2012-04-24 ENCOUNTER — Other Ambulatory Visit (INDEPENDENT_AMBULATORY_CARE_PROVIDER_SITE_OTHER): Payer: Self-pay

## 2012-04-24 MED ORDER — LEVOTHYROXINE SODIUM 88 MCG PO TABS: 88.0000 ug | ORAL_TABLET | Freq: Every day | ORAL | Status: DC

## 2012-04-24 NOTE — Telephone Encounter (Signed)
Left voice message for patient regarding rx for Synthroid dose change from 75 mcg to 88 mcg.

## 2012-04-27 ENCOUNTER — Telehealth (INDEPENDENT_AMBULATORY_CARE_PROVIDER_SITE_OTHER): Payer: Self-pay

## 2012-04-27 ENCOUNTER — Other Ambulatory Visit (INDEPENDENT_AMBULATORY_CARE_PROVIDER_SITE_OTHER): Payer: Self-pay

## 2012-04-27 DIAGNOSIS — C73 Malignant neoplasm of thyroid gland: Secondary | ICD-10-CM

## 2012-04-27 NOTE — Telephone Encounter (Addendum)
Return call to Ms. Elige Radon re: Synthroid dose change from 72 mcg to 88 mcg.  Spoke with patient daughter, Ms. Hepner husband unexpectedly passed away Thursday 05/11/2012, daughter requested her rx Synthroid  Called in to [facility] on [address].

## 2012-05-18 ENCOUNTER — Encounter (HOSPITAL_COMMUNITY)
Admission: RE | Admit: 2012-05-18 | Discharge: 2012-05-18 | Disposition: A | Discharge status: Home / Self Care | Payer: Medicare Other | Source: Ambulatory Visit | Attending: General Surgery | Admitting: General Surgery

## 2012-05-18 DIAGNOSIS — C73 Malignant neoplasm of thyroid gland: Secondary | ICD-10-CM

## 2012-05-18 MED ORDER — THYROTROPIN ALFA 1.1 MG IM SOLR
0.9000 mg | INTRAMUSCULAR | Status: AC
  Administered 2012-05-18: 0.9 mg via INTRAMUSCULAR
  Filled 2012-05-18: qty 0.9

## 2012-05-19 ENCOUNTER — Ambulatory Visit (HOSPITAL_COMMUNITY)
Admission: RE | Admit: 2012-05-19 | Discharge: 2012-05-19 | Disposition: A | Discharge status: Home / Self Care | Payer: Medicare Other | Source: Ambulatory Visit | Attending: General Surgery | Admitting: General Surgery

## 2012-05-19 DIAGNOSIS — C73 Malignant neoplasm of thyroid gland: Secondary | ICD-10-CM | POA: Insufficient documentation

## 2012-05-19 MED ORDER — THYROTROPIN ALFA 1.1 MG IM SOLR
0.9000 mg | INTRAMUSCULAR | Status: AC
  Administered 2012-05-19: 0.9 mg via INTRAMUSCULAR
  Filled 2012-05-19: qty 0.9

## 2012-05-20 ENCOUNTER — Encounter (HOSPITAL_COMMUNITY)
Admission: RE | Admit: 2012-05-20 | Discharge: 2012-05-20 | Disposition: A | Discharge status: Home / Self Care | Payer: Medicare Other | Source: Ambulatory Visit | Attending: General Surgery | Admitting: General Surgery

## 2012-05-20 MED ORDER — SODIUM IODIDE I 131 CAPSULE
101.1000 | Freq: Once | INTRAVENOUS | Status: AC | PRN
  Administered 2012-05-20: 101.1 via ORAL

## 2012-05-29 ENCOUNTER — Encounter (INDEPENDENT_AMBULATORY_CARE_PROVIDER_SITE_OTHER): Payer: Medicare Other | Admitting: General Surgery

## 2012-05-29 ENCOUNTER — Encounter (HOSPITAL_COMMUNITY)
Admission: RE | Admit: 2012-05-29 | Discharge: 2012-05-29 | Disposition: A | Discharge status: Home / Self Care | Payer: Medicare Other | Source: Ambulatory Visit | Attending: General Surgery | Admitting: General Surgery

## 2012-05-29 DIAGNOSIS — C73 Malignant neoplasm of thyroid gland: Secondary | ICD-10-CM | POA: Insufficient documentation

## 2012-06-09 ENCOUNTER — Telehealth (INDEPENDENT_AMBULATORY_CARE_PROVIDER_SITE_OTHER): Payer: Self-pay

## 2012-06-09 NOTE — Telephone Encounter (Signed)
Return call to patient re: I131 Scan results given to Ms. Elige Radon

## 2012-06-12 ENCOUNTER — Telehealth (INDEPENDENT_AMBULATORY_CARE_PROVIDER_SITE_OTHER): Payer: Self-pay

## 2012-06-12 NOTE — Telephone Encounter (Signed)
Pts daughter called requesting scan result. Advised I will send msg to [facility] to review and return call.

## 2012-06-12 NOTE — Telephone Encounter (Signed)
Spoke with Marylu Lund (patient daughter) to review I-131 Scan results, follow up appointment given to daughter for 07/07/12 @ 5:00 pm w/Dr. Johna Sheriff.

## 2012-06-25 ENCOUNTER — Other Ambulatory Visit (INDEPENDENT_AMBULATORY_CARE_PROVIDER_SITE_OTHER): Payer: Self-pay | Admitting: General Surgery

## 2012-06-25 DIAGNOSIS — C73 Malignant neoplasm of thyroid gland: Secondary | ICD-10-CM

## 2012-06-26 NOTE — Telephone Encounter (Signed)
Okay to refill, patient need's TSH level checked

## 2012-07-03 ENCOUNTER — Other Ambulatory Visit: Payer: Self-pay | Admitting: Family Medicine

## 2012-07-03 DIAGNOSIS — Z1231 Encounter for screening mammogram for malignant neoplasm of breast: Secondary | ICD-10-CM

## 2012-07-07 ENCOUNTER — Ambulatory Visit (INDEPENDENT_AMBULATORY_CARE_PROVIDER_SITE_OTHER): Payer: Medicare Other | Admitting: General Surgery

## 2012-07-07 ENCOUNTER — Encounter (INDEPENDENT_AMBULATORY_CARE_PROVIDER_SITE_OTHER): Payer: Self-pay | Admitting: General Surgery

## 2012-07-07 VITALS — BP 138/80 | HR 72 | Temp 96.6°F | Resp 20 | Ht 62.0 in | Wt 162.8 lb

## 2012-07-07 DIAGNOSIS — C73 Malignant neoplasm of thyroid gland: Secondary | ICD-10-CM

## 2012-07-07 NOTE — Progress Notes (Signed)
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 She now has completed her I-131 diagnosis and treatment with only residual thyroid tissue in the bed seen. She unfortunately suddenly lost her husband soon after the surgery which delayed getting her radiation treatment in. At this point she is obviously somewhat depressed but does not have symptoms of hypo-or hyperthyroidism on 88 mcg of Synthroid daily. No other neck complaints.  Exam: Gen.: Elderly white female in no distress Lymph nodes: No cervical or suprapubic lymph nodes palpable HEENT: Well-healed thyroidectomy incision without palpable masses.  Assessment and plan: Doing well following total thyroidectomy and subsequent I-131 treatment for stage II follicular cancer of the thyroid. She is to see Dr. Collins [place] next month and we discussed that she could check a TSH at that time to confirm she is on the proper dosage of Synthroid. I will see her back in 6 months.

## 2012-07-23 ENCOUNTER — Ambulatory Visit
Admission: RE | Admit: 2012-07-23 | Discharge: 2012-07-23 | Disposition: A | Discharge status: Home / Self Care | Payer: Medicare Other | Source: Ambulatory Visit | Attending: Family Medicine | Admitting: Family Medicine

## 2012-07-23 DIAGNOSIS — Z1231 Encounter for screening mammogram for malignant neoplasm of breast: Secondary | ICD-10-CM

## 2012-07-27 IMAGING — US US THYROID BIOPSY
1 series · 8 of 8 positions shown · non-contrast
Comparison: Thyroid ultrasound dated 12/16/2011

CLINICAL DATA: Dominant right thyroid nodule measuring 2.4 cm in
diameter.

ULTRASOUND GUIDED NEEDLE ASPIRATE BIOPSY OF THE THYROID GLAND

[Series 1: us thyroid biopsy · 0.07mm/px · 8 acquisitions, 8 frames shown]
[im 1/8]
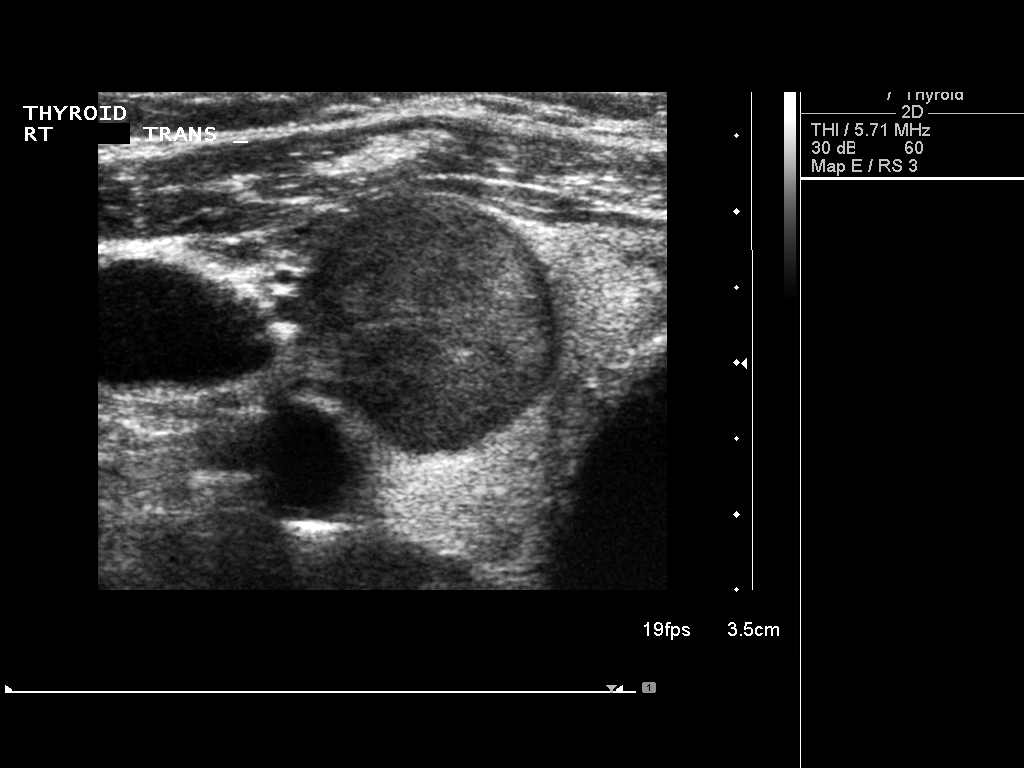
[im 2/8]
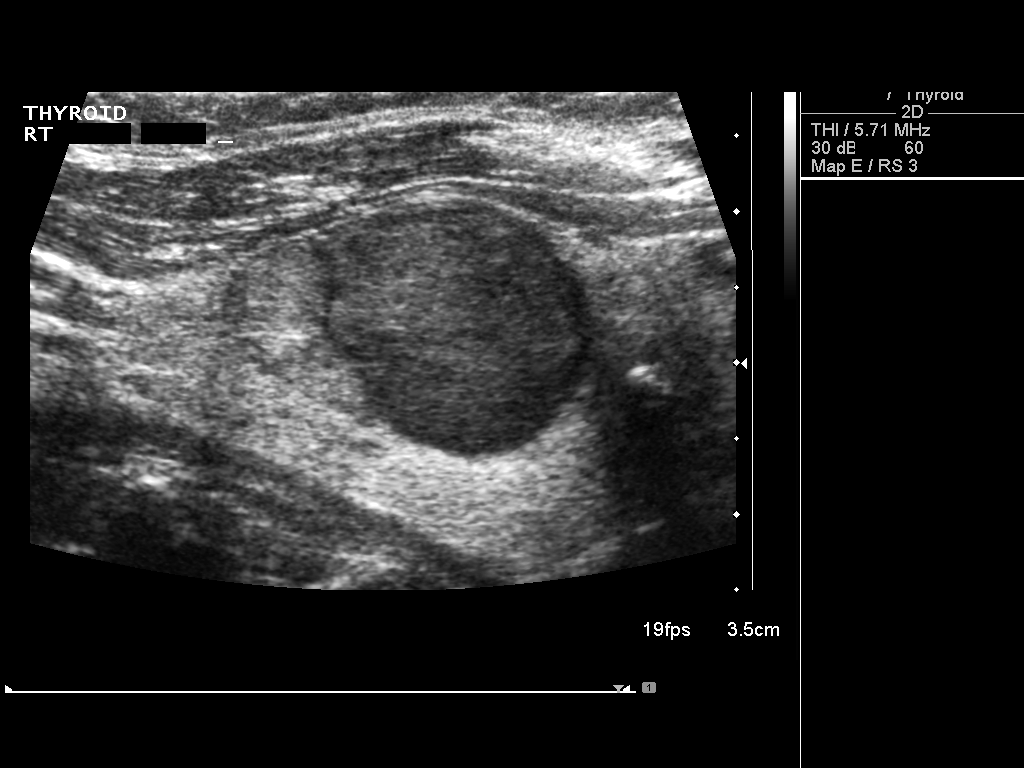
[im 3/8]
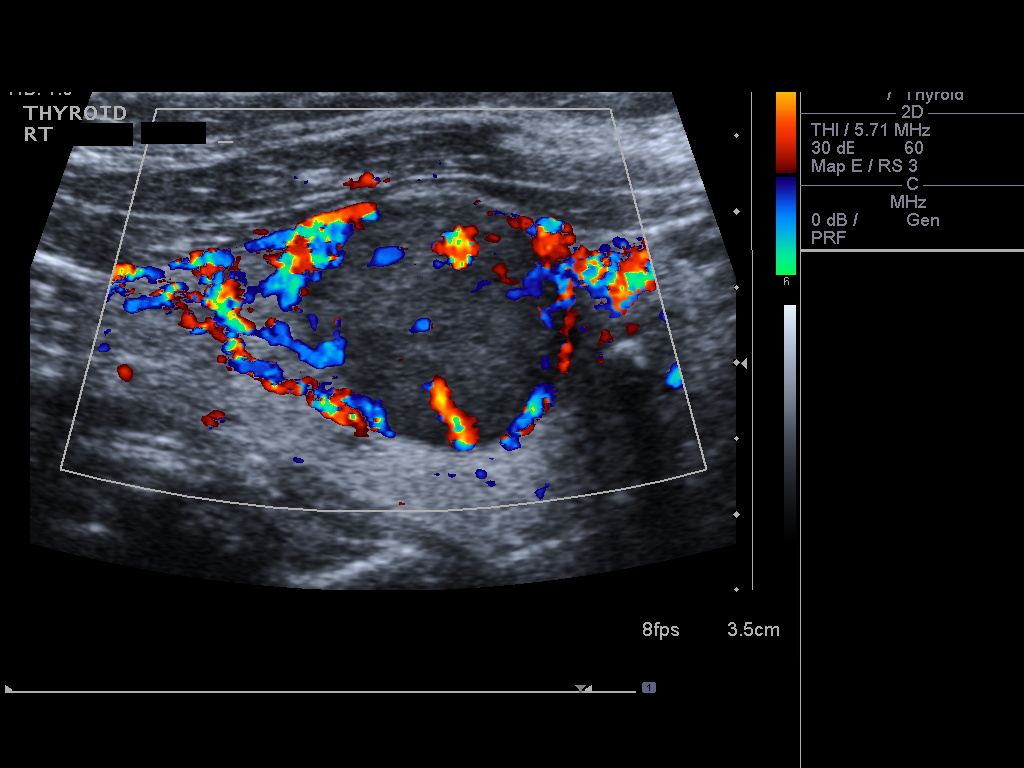
[im 4/8]
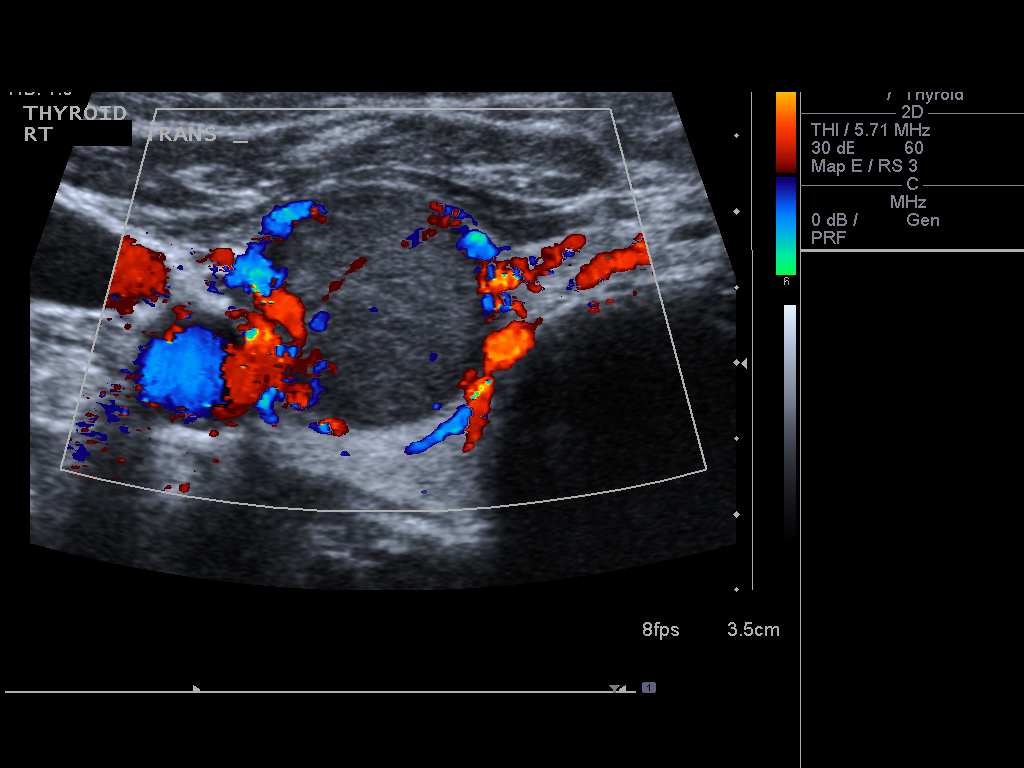
[im 5/8]
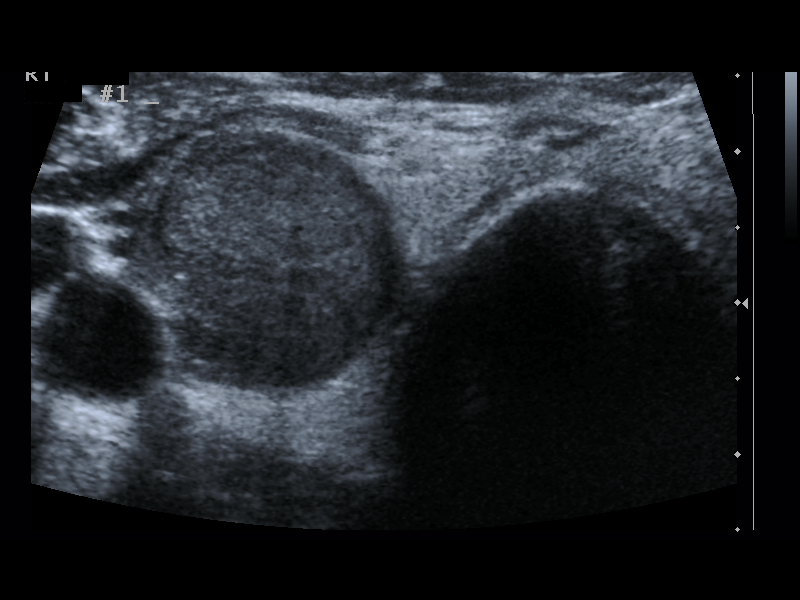
[im 6/8]
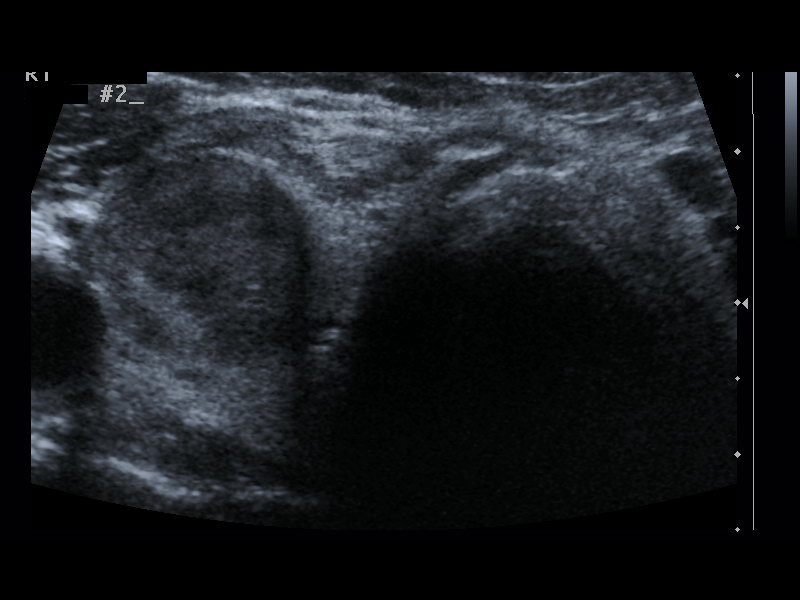
[im 7/8]
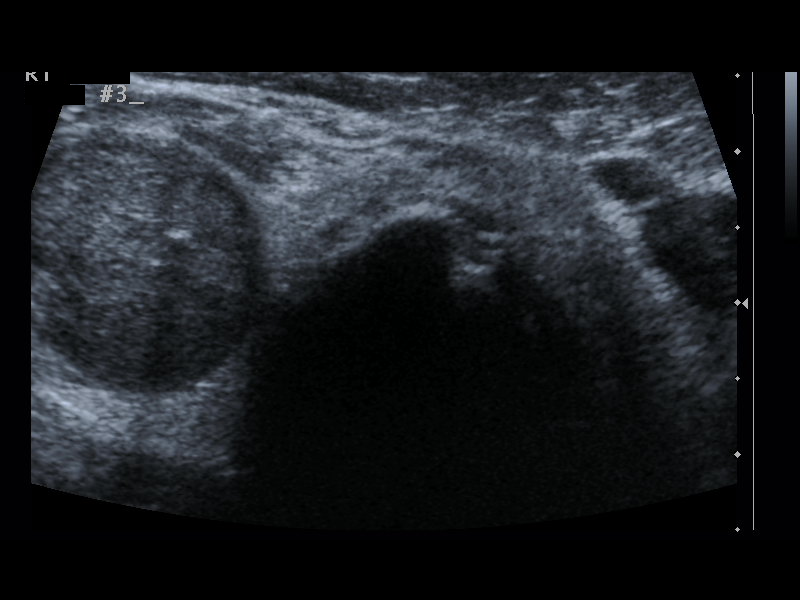
[im 8/8]
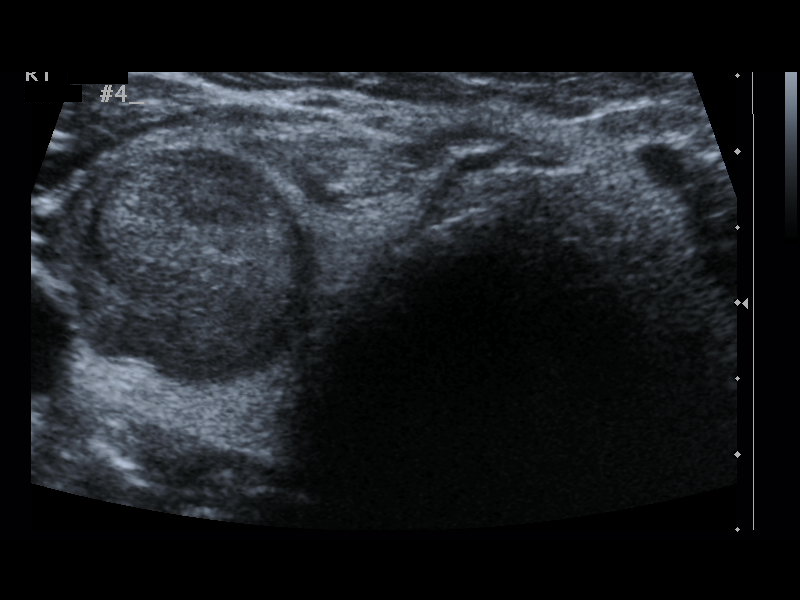

[8 of 8 positions shown; findings below may reference images not displayed]

Thyroid biopsy was thoroughly discussed with the patient and
questions were answered.  The benefits, risks, alternatives, and
complications were also discussed.  The patient understands and
wishes to proceed with the procedure.  Written consent was
obtained.

Ultrasound was performed to localize and mark an adequate site for
the biopsy.  The patient was then prepped and draped in a normal
sterile fashion.  Local anesthesia was provided with 1% lidocaine.
Using direct ultrasound guidance, 4 passes were made using 25 gauge
needles into the nodule within the right lobe of the thyroid.
Ultrasound was used to confirm needle placements on all occasions.
Specimens were sent to Pathology for analysis.

Complications:  None
FINDINGS: Samples were obtained directly in different portions of the solid
right thyroid nodule.
IMPRESSION: Ultrasound guided needle aspirate biopsy performed of the right
thyroid nodule.

## 2013-09-01 ENCOUNTER — Encounter (HOSPITAL_COMMUNITY): Payer: Self-pay | Admitting: Emergency Medicine

## 2013-09-01 ENCOUNTER — Emergency Department (HOSPITAL_COMMUNITY)
Admission: EM | Admit: 2013-09-01 | Discharge: 2013-09-01 | Disposition: A | Discharge status: Home / Self Care | Payer: Medicare Other | Attending: Emergency Medicine | Admitting: Emergency Medicine

## 2013-09-01 ENCOUNTER — Emergency Department (HOSPITAL_COMMUNITY): Payer: Medicare Other

## 2013-09-01 DIAGNOSIS — W1811XA Fall from or off toilet without subsequent striking against object, initial encounter: Secondary | ICD-10-CM | POA: Insufficient documentation

## 2013-09-01 DIAGNOSIS — S01501A Unspecified open wound of lip, initial encounter: Secondary | ICD-10-CM | POA: Insufficient documentation

## 2013-09-01 DIAGNOSIS — Z8639 Personal history of other endocrine, nutritional and metabolic disease: Secondary | ICD-10-CM | POA: Insufficient documentation

## 2013-09-01 DIAGNOSIS — E041 Nontoxic single thyroid nodule: Secondary | ICD-10-CM | POA: Insufficient documentation

## 2013-09-01 DIAGNOSIS — S025XXA Fracture of tooth (traumatic), initial encounter for closed fracture: Secondary | ICD-10-CM | POA: Insufficient documentation

## 2013-09-01 DIAGNOSIS — R55 Syncope and collapse: Secondary | ICD-10-CM | POA: Insufficient documentation

## 2013-09-01 DIAGNOSIS — M129 Arthropathy, unspecified: Secondary | ICD-10-CM | POA: Insufficient documentation

## 2013-09-01 DIAGNOSIS — Z862 Personal history of diseases of the blood and blood-forming organs and certain disorders involving the immune mechanism: Secondary | ICD-10-CM | POA: Insufficient documentation

## 2013-09-01 DIAGNOSIS — Y929 Unspecified place or not applicable: Secondary | ICD-10-CM | POA: Insufficient documentation

## 2013-09-01 DIAGNOSIS — S0181XA Laceration without foreign body of other part of head, initial encounter: Secondary | ICD-10-CM

## 2013-09-01 DIAGNOSIS — S022XXB Fracture of nasal bones, initial encounter for open fracture: Secondary | ICD-10-CM | POA: Insufficient documentation

## 2013-09-01 DIAGNOSIS — Z8719 Personal history of other diseases of the digestive system: Secondary | ICD-10-CM | POA: Insufficient documentation

## 2013-09-01 DIAGNOSIS — Z79899 Other long term (current) drug therapy: Secondary | ICD-10-CM | POA: Insufficient documentation

## 2013-09-01 DIAGNOSIS — Y9389 Activity, other specified: Secondary | ICD-10-CM | POA: Insufficient documentation

## 2013-09-01 DIAGNOSIS — S0120XA Unspecified open wound of nose, initial encounter: Secondary | ICD-10-CM | POA: Insufficient documentation

## 2013-09-01 DIAGNOSIS — Z7982 Long term (current) use of aspirin: Secondary | ICD-10-CM | POA: Insufficient documentation

## 2013-09-01 LAB — POCT I-STAT, CHEM 8
HCT: 39 % (ref 36.0–46.0)
Hemoglobin: 13.3 g/dL (ref 12.0–15.0)
Sodium: 139 mEq/L (ref 135–145)
TCO2: 27 mmol/L (ref 0–100)

## 2013-09-01 LAB — URINALYSIS, ROUTINE W REFLEX MICROSCOPIC
Glucose, UA: NEGATIVE mg/dL
Leukocytes, UA: NEGATIVE
Nitrite: NEGATIVE
Protein, ur: NEGATIVE mg/dL
Urobilinogen, UA: 0.2 mg/dL (ref 0.0–1.0)

## 2013-09-01 LAB — CBC WITH DIFFERENTIAL/PLATELET
Basophils Absolute: 0.1 10*3/uL (ref 0.0–0.1)
Eosinophils Absolute: 0.1 10*3/uL (ref 0.0–0.7)
Lymphs Abs: 1.3 10*3/uL (ref 0.7–4.0)
MCH: 31.1 pg (ref 26.0–34.0)
Neutrophils Relative %: 71 % (ref 43–77)
Platelets: 181 10*3/uL (ref 150–400)
RBC: 3.79 MIL/uL — ABNORMAL LOW (ref 3.87–5.11)
WBC: 8.7 10*3/uL (ref 4.0–10.5)

## 2013-09-01 LAB — POCT I-STAT TROPONIN I: Troponin i, poc: 0 ng/mL (ref 0.00–0.08)

## 2013-09-01 MED ORDER — CEPHALEXIN 500 MG PO CAPS: 500.0000 mg | ORAL_CAPSULE | Freq: Four times a day (QID) | ORAL | Status: DC

## 2013-09-01 MED ORDER — MORPHINE SULFATE 4 MG/ML IJ SOLN
4.0000 mg | Freq: Once | INTRAMUSCULAR | Status: AC
  Administered 2013-09-01: 4 mg via INTRAVENOUS
  Filled 2013-09-01: qty 1

## 2013-09-01 MED ORDER — ONDANSETRON HCL 4 MG/2ML IJ SOLN
4.0000 mg | Freq: Once | INTRAMUSCULAR | Status: AC
  Administered 2013-09-01: 4 mg via INTRAVENOUS
  Filled 2013-09-01: qty 2

## 2013-09-01 NOTE — ED Notes (Signed)
Pt had syncopal episode while having BM this am. Small laceration to bridge of nose and bottom lip. Bleeding controlled. Pt c/o pain to neck laterally right side. No blood thinners. Able to move all extremities.

## 2013-09-01 NOTE — ED Notes (Signed)
Patient transported to CT 

## 2013-09-01 NOTE — ED Notes (Addendum)
Per EMS pt from home with c/o syncopal episode after bowel moving. Pt had loose stools, got off commode, needed to use more. Got back on commode and fell forwards. +LOC. Lac to bridge of nose. Initially no back/neck pain. In route began to c/o right neck pain and right shoulder pain. No blood thinners. EKG unremarkable. VSS. 22G LFA and CBG 116.

## 2013-09-01 NOTE — ED Provider Notes (Signed)
CSN: [id number]     Arrival date & time 09/01/13  4098 History   First MD Initiated Contact with Patient 09/01/13 [phone]     Chief Complaint  Patient presents with  . Loss of Consciousness   (Consider location/radiation/quality/duration/timing/severity/associated sxs/prior Treatment) Patient is a 79 y.o. female presenting with syncope. The history is provided by the patient.  Loss of Consciousness Associated symptoms: no chest pain, no headaches, no nausea, no shortness of breath, no vomiting and no weakness    patient began to feel bad while she was on the toilet. She states that she thought she was laying herself to the ground he woke up with some blood. She thinks she fell. She's had similar episodes previously. No chest pain. No trouble breathing. No neck pain. No confusion. No abdominal pain. She states she feels better now except for the pain in her face.  Past Medical History  Diagnosis Date  . Arthritis   . GERD (gastroesophageal reflux disease)   . Hyperlipidemia   . Thyroid nodule   . Polymyalgia rheumatica    Past Surgical History  Procedure Laterality Date  . Back surgery      x2  . Cholecystectomy  1966  . Abdominal hysterectomy  1978  . Appendectomy  1948  . Cataract extraction, bilateral  03-12-12    bilateral  . Thyroid lobectomy  03/17/2012    Procedure: THYROID LOBECTOMY;  Surgeon: Mariella Saa, MD;  Location: [facility];  Service: General;  Laterality: Right;  . Thyroidectomy  03/17/2012    Procedure: THYROIDECTOMY;  Surgeon: Mariella Saa, MD;  Location: [facility];  Service: General;  Laterality: N/A;   Family History  Problem Relation Age of Onset  . Heart disease Mother     heart attack  . Stroke Father   . Cancer Sister     Thyroid   History  Substance Use Topics  . Smoking status: Never Smoker   . Smokeless tobacco: Not on file  . Alcohol Use: No   OB History   Grav Para Term Preterm Abortions TAB SAB Ect Mult Living                 Review  of Systems  Constitutional: Negative for activity change and appetite change.  Eyes: Negative for pain.  Respiratory: Negative for chest tightness and shortness of breath.   Cardiovascular: Positive for syncope. Negative for chest pain and leg swelling.  Gastrointestinal: Negative for nausea, vomiting, abdominal pain and diarrhea.  Genitourinary: Negative for flank pain.  Musculoskeletal: Positive for neck pain. Negative for back pain and neck stiffness.  Skin: Positive for wound. Negative for rash.  Neurological: Positive for syncope. Negative for weakness, numbness and headaches.  Psychiatric/Behavioral: Negative for behavioral problems.    Allergies  Levofloxacin  Home Medications   Current Outpatient Rx  Name  Route  Sig  Dispense  Refill  . aspirin 81 MG tablet   Oral   Take 81 mg by mouth daily with breakfast.          . calcium-vitamin D (CALCIUM 500+D) 500-200 MG-UNIT per tablet   Oral   Take 1 tablet by mouth daily with breakfast.         . Cholecalciferol (VITAMIN D3) 1000 UNITS CAPS   Oral   Take 1,000 Units by mouth daily.          . fish oil-omega-3 fatty acids 1000 MG capsule   Oral   Take 2 g by mouth daily with  breakfast.         . folic acid (FOLVITE) 800 MCG tablet   Oral   Take 400 mcg by mouth daily with breakfast.         . gabapentin (NEURONTIN) 300 MG capsule   Oral   Take 300 mg by mouth Three times a day. Pt. Takes all between hours 6:00p and 10:00 pm         . levothyroxine (SYNTHROID, LEVOTHROID) 137 MCG tablet   Oral   Take 137 mcg by mouth daily before breakfast.         . lisinopril (PRINIVIL,ZESTRIL) 10 MG tablet   Oral   Take 10 mg by mouth daily.         Marland Kitchen rOPINIRole (REQUIP) 1 MG tablet   Oral   Take 0.5-1 mg by mouth 2 (two) times daily. Pt takes .5 mg in am and 1 mg in pm          BP 134/65  Pulse 76  Temp(Src) 98.2 F (36.8 C) (Oral)  Resp 18  SpO2 94% Physical Exam  Constitutional: She is oriented  to person, place, and time. She appears well-developed and well-nourished.  HENT:  1 cm horizontal laceration to lower lip. Approximately 1.5 cm laceration interior of lower lip. The middle 2 teeth on her upper jaw a broken off somewhat superficially. No bleeding From the teeth. no tenderness at angle of jaw or TMJ area. Laceration over her nose. Hematoma over her nose. He mild blood in nose, no septal hematoma. 5 mm laceration over nose.  Neck:  Mild midline tenderness. No step-off or deformity.  Cardiovascular: Normal rate and regular rhythm.   Pulmonary/Chest: Effort normal and breath sounds normal.  Abdominal: Soft. There is no tenderness.  Musculoskeletal: She exhibits no tenderness.  Neurological: She is alert and oriented to person, place, and time.  Skin: Skin is warm.    ED Course  Procedures (including critical care time) Labs Review Labs Reviewed  CBC WITH DIFFERENTIAL  URINALYSIS, ROUTINE W REFLEX MICROSCOPIC   Imaging Review No results found.  EKG Interpretation   None      Date: 09/01/2013  Rate: 69  Rhythm: normal sinus rhythm  QRS Axis: normal  Intervals: normal  ST/T Wave abnormalities: normal  Conduction Disutrbances:none  Narrative Interpretation:   Old EKG Reviewed: unchanged  LACERATION REPAIR Performed by: Billee Cashing Authorized by: Billee Cashing Consent: Verbal consent obtained. Risks and benefits: risks, benefits and alternatives were discussed Consent given by: patient Patient identity confirmed: provided demographic data Prepped and Draped in normal sterile fashion Wound explored  Laceration Location: Lower lip  Laceration Length: 1.5 cm  Piece of tooth removed from wound  Anesthesia: local infiltration  Local anesthetic: lidocaine 2% without epinephrine  Anesthetic total: 1 ml  Irrigation method: syringe Amount of cleaning: standard  Skin closure: 5-0 Vicryl Rapide   Number of sutures: 4   Technique: Simple  interrupted   Patient tolerance: Patient tolerated the procedure well with no immediate complications.  MDM  No diagnosis found. Patient with fall after syncope. Nasal fracture. Possible piece of bone near wound, but unable to remove. Wound was Dermabond and antibiotics were given. Also had laceration. Wound was closed. We'll follow with her nose and throat. Syncope likely related to neurogenic syncope after going to the bathroom.    Juliet Rude. Rubin Payor, MD 09/01/13 [phone]

## 2014-11-01 ENCOUNTER — Encounter: Payer: Self-pay | Admitting: Cardiovascular Disease

## 2016-01-12 ENCOUNTER — Other Ambulatory Visit: Payer: Self-pay | Admitting: Nurse Practitioner

## 2016-01-12 DIAGNOSIS — R5381 Other malaise: Secondary | ICD-10-CM

## 2016-01-12 DIAGNOSIS — Z1231 Encounter for screening mammogram for malignant neoplasm of breast: Secondary | ICD-10-CM

## 2016-01-23 ENCOUNTER — Other Ambulatory Visit: Payer: Self-pay | Admitting: Nurse Practitioner

## 2016-01-23 DIAGNOSIS — E2839 Other primary ovarian failure: Secondary | ICD-10-CM

## 2016-02-08 ENCOUNTER — Ambulatory Visit
Admission: RE | Admit: 2016-02-08 | Discharge: 2016-02-08 | Disposition: A | Discharge status: Home / Self Care | Payer: Medicare Other | Source: Ambulatory Visit | Attending: Nurse Practitioner | Admitting: Nurse Practitioner

## 2016-02-08 DIAGNOSIS — Z1231 Encounter for screening mammogram for malignant neoplasm of breast: Secondary | ICD-10-CM

## 2016-02-08 DIAGNOSIS — E2839 Other primary ovarian failure: Secondary | ICD-10-CM

## 2016-02-12 ENCOUNTER — Other Ambulatory Visit: Payer: Self-pay | Admitting: Nurse Practitioner

## 2016-02-12 DIAGNOSIS — R928 Other abnormal and inconclusive findings on diagnostic imaging of breast: Secondary | ICD-10-CM

## 2016-02-19 ENCOUNTER — Ambulatory Visit
Admission: RE | Admit: 2016-02-19 | Discharge: 2016-02-19 | Disposition: A | Discharge status: Home / Self Care | Payer: Medicare Other | Source: Ambulatory Visit | Attending: Nurse Practitioner | Admitting: Nurse Practitioner

## 2016-02-19 DIAGNOSIS — R928 Other abnormal and inconclusive findings on diagnostic imaging of breast: Secondary | ICD-10-CM

## 2017-03-31 ENCOUNTER — Other Ambulatory Visit: Payer: Self-pay | Admitting: Nurse Practitioner

## 2017-03-31 DIAGNOSIS — Z1231 Encounter for screening mammogram for malignant neoplasm of breast: Secondary | ICD-10-CM

## 2017-04-21 ENCOUNTER — Ambulatory Visit
Admission: RE | Admit: 2017-04-21 | Discharge: 2017-04-21 | Disposition: A | Discharge status: Home / Self Care | Payer: Medicare Other | Source: Ambulatory Visit | Attending: Nurse Practitioner | Admitting: Nurse Practitioner

## 2017-04-21 DIAGNOSIS — Z1231 Encounter for screening mammogram for malignant neoplasm of breast: Secondary | ICD-10-CM

## 2018-03-25 ENCOUNTER — Other Ambulatory Visit: Payer: Self-pay | Admitting: Nurse Practitioner

## 2018-03-25 DIAGNOSIS — Z1231 Encounter for screening mammogram for malignant neoplasm of breast: Secondary | ICD-10-CM

## 2018-04-23 ENCOUNTER — Ambulatory Visit
Admission: RE | Admit: 2018-04-23 | Discharge: 2018-04-23 | Disposition: A | Discharge status: Home / Self Care | Payer: Medicare Other | Source: Ambulatory Visit | Attending: Nurse Practitioner | Admitting: Nurse Practitioner

## 2018-04-23 DIAGNOSIS — Z1231 Encounter for screening mammogram for malignant neoplasm of breast: Secondary | ICD-10-CM

## 2018-12-17 ENCOUNTER — Other Ambulatory Visit: Payer: Self-pay | Admitting: Nurse Practitioner

## 2018-12-17 DIAGNOSIS — M899 Disorder of bone, unspecified: Secondary | ICD-10-CM

## 2019-01-28 ENCOUNTER — Other Ambulatory Visit: Payer: Self-pay | Admitting: Nurse Practitioner

## 2019-01-28 DIAGNOSIS — Z1231 Encounter for screening mammogram for malignant neoplasm of breast: Secondary | ICD-10-CM

## 2019-01-28 DIAGNOSIS — E2839 Other primary ovarian failure: Secondary | ICD-10-CM

## 2019-05-07 ENCOUNTER — Other Ambulatory Visit: Payer: Self-pay

## 2019-05-07 ENCOUNTER — Ambulatory Visit
Admission: RE | Admit: 2019-05-07 | Discharge: 2019-05-07 | Disposition: A | Discharge status: Home / Self Care | Payer: Medicare Other | Source: Ambulatory Visit | Attending: Nurse Practitioner | Admitting: Nurse Practitioner

## 2019-05-07 DIAGNOSIS — Z1231 Encounter for screening mammogram for malignant neoplasm of breast: Secondary | ICD-10-CM

## 2019-05-07 DIAGNOSIS — E2839 Other primary ovarian failure: Secondary | ICD-10-CM

## 2019-05-10 ENCOUNTER — Other Ambulatory Visit: Payer: Self-pay | Admitting: Nurse Practitioner

## 2019-05-10 DIAGNOSIS — R928 Other abnormal and inconclusive findings on diagnostic imaging of breast: Secondary | ICD-10-CM

## 2019-05-13 ENCOUNTER — Other Ambulatory Visit: Payer: Self-pay

## 2019-05-13 ENCOUNTER — Ambulatory Visit: Payer: Medicare Other

## 2019-05-13 ENCOUNTER — Ambulatory Visit
Admission: RE | Admit: 2019-05-13 | Discharge: 2019-05-13 | Disposition: A | Discharge status: Home / Self Care | Payer: Medicare Other | Source: Ambulatory Visit | Attending: Nurse Practitioner | Admitting: Nurse Practitioner

## 2019-05-13 DIAGNOSIS — R928 Other abnormal and inconclusive findings on diagnostic imaging of breast: Secondary | ICD-10-CM

## 2019-06-30 ENCOUNTER — Emergency Department (HOSPITAL_COMMUNITY)
Admission: EM | Admit: 2019-06-30 | Discharge: 2019-06-30 | Disposition: A | Discharge status: Home / Self Care | Payer: Medicare Other | Attending: Emergency Medicine | Admitting: Emergency Medicine

## 2019-06-30 ENCOUNTER — Encounter (HOSPITAL_COMMUNITY): Payer: Self-pay | Admitting: Emergency Medicine

## 2019-06-30 ENCOUNTER — Emergency Department (HOSPITAL_COMMUNITY): Payer: Medicare Other

## 2019-06-30 DIAGNOSIS — S0990XA Unspecified injury of head, initial encounter: Secondary | ICD-10-CM

## 2019-06-30 DIAGNOSIS — W010XXA Fall on same level from slipping, tripping and stumbling without subsequent striking against object, initial encounter: Secondary | ICD-10-CM | POA: Diagnosis not present

## 2019-06-30 DIAGNOSIS — Y92009 Unspecified place in unspecified non-institutional (private) residence as the place of occurrence of the external cause: Secondary | ICD-10-CM

## 2019-06-30 DIAGNOSIS — W19XXXA Unspecified fall, initial encounter: Secondary | ICD-10-CM

## 2019-06-30 DIAGNOSIS — Y999 Unspecified external cause status: Secondary | ICD-10-CM | POA: Diagnosis not present

## 2019-06-30 DIAGNOSIS — Z7982 Long term (current) use of aspirin: Secondary | ICD-10-CM | POA: Insufficient documentation

## 2019-06-30 DIAGNOSIS — Y9389 Activity, other specified: Secondary | ICD-10-CM | POA: Insufficient documentation

## 2019-06-30 DIAGNOSIS — Z79899 Other long term (current) drug therapy: Secondary | ICD-10-CM | POA: Insufficient documentation

## 2019-06-30 DIAGNOSIS — Y929 Unspecified place or not applicable: Secondary | ICD-10-CM | POA: Insufficient documentation

## 2019-06-30 DIAGNOSIS — S0011XA Contusion of right eyelid and periocular area, initial encounter: Secondary | ICD-10-CM | POA: Insufficient documentation

## 2019-06-30 DIAGNOSIS — S0181XA Laceration without foreign body of other part of head, initial encounter: Secondary | ICD-10-CM

## 2019-06-30 DIAGNOSIS — S022XXA Fracture of nasal bones, initial encounter for closed fracture: Secondary | ICD-10-CM | POA: Diagnosis not present

## 2019-06-30 LAB — CBC WITH DIFFERENTIAL/PLATELET
Abs Immature Granulocytes: 0.03 10*3/uL (ref 0.00–0.07)
Basophils Absolute: 0.1 10*3/uL (ref 0.0–0.1)
Basophils Relative: 1 %
Eosinophils Absolute: 0.1 10*3/uL (ref 0.0–0.5)
Eosinophils Relative: 1 %
HCT: 36.2 % (ref 36.0–46.0)
Hemoglobin: 11.4 g/dL — ABNORMAL LOW (ref 12.0–15.0)
Immature Granulocytes: 0 %
Lymphocytes Relative: 14 %
Lymphs Abs: 1.1 10*3/uL (ref 0.7–4.0)
MCH: 30.6 pg (ref 26.0–34.0)
MCHC: 31.5 g/dL (ref 30.0–36.0)
MCV: 97.1 fL (ref 80.0–100.0)
Monocytes Absolute: 0.9 10*3/uL (ref 0.1–1.0)
Monocytes Relative: 11 %
Neutro Abs: 6 10*3/uL (ref 1.7–7.7)
Neutrophils Relative %: 73 %
Platelets: 198 10*3/uL (ref 150–400)
RBC: 3.73 MIL/uL — ABNORMAL LOW (ref 3.87–5.11)
RDW: 12.5 % (ref 11.5–15.5)
WBC: 8.3 10*3/uL (ref 4.0–10.5)
nRBC: 0 % (ref 0.0–0.2)

## 2019-06-30 LAB — BASIC METABOLIC PANEL
Anion gap: 8 (ref 5–15)
BUN: 20 mg/dL (ref 8–23)
CO2: 26 mmol/L (ref 22–32)
Calcium: 9.1 mg/dL (ref 8.9–10.3)
Chloride: 103 mmol/L (ref 98–111)
Creatinine, Ser: 0.92 mg/dL (ref 0.44–1.00)
GFR calc Af Amer: >60 mL/min (ref >60)
GFR calc non Af Amer: 55 mL/min — ABNORMAL LOW (ref >60)
Glucose, Bld: 102 mg/dL — ABNORMAL HIGH (ref 70–99)
Potassium: 4 mmol/L (ref 3.5–5.1)
Sodium: 137 mmol/L (ref 135–145)

## 2019-06-30 MED ORDER — LIDOCAINE-EPINEPHRINE (PF) 2 %-1:200000 IJ SOLN
10.0000 mL | Freq: Once | INTRAMUSCULAR | Status: AC
  Administered 2019-06-30: 10 mL
  Filled 2019-06-30: qty 20

## 2019-06-30 NOTE — Discharge Instructions (Addendum)
Please follow instructions regarding wound care. You will need to return in 5 days for suture removal.. Return to the ED if you start to experience worsening headache, blurry vision, numbness in arms or legs, signs of infection including redness, swelling or fever.

## 2019-06-30 NOTE — ED Triage Notes (Signed)
Pt arrives to ED with daughter where she had a fall one 1 hour. Pt has large hematoma above left eye with avulsion. Pt also has bruising around left eye.

## 2019-06-30 NOTE — ED Provider Notes (Signed)
[facility] EMERGENCY DEPARTMENT Provider Note   CSN: [id number] Arrival date & time: 06/30/19  1528     History   Chief Complaint Chief Complaint  Patient presents with   Fall    HPI [name] is a 85 y.o. female medical history of hyperlipidemia, arthritis presents to ED for mechanical fall that occurred prior to arrival.  States that she was emptying her vacuum when she was reaching into her trash can and fell forward after losing her balance.  States that she did not have any loss of consciousness but did hit the left side of her forehead.  She felt "giddy headed" immediately after the fall.  States that she has pain in her head at the site of injury.  She can feel that her left is not having any changes to EOMs, vision changes or discharge.  She denies any anticoagulant use.  Denies any neck pain, chest pain, abdominal pain, vomiting, numbness in arms or legs.  She has been ambulatory without difficulty since the fall.  The fall was unwitnessed.     HPI  Past Medical History:  Diagnosis Date   Arthritis    GERD (gastroesophageal reflux disease)    Hyperlipidemia    Polymyalgia rheumatica (HCC)    Thyroid nodule     Patient Active Problem List   Diagnosis Date Noted   Cancer of thyroid ([place]) 04/14/2012   ANXIETY 05/01/2009   GERD 05/01/2009   CHEST PAIN 05/01/2009   HYPERLIPIDEMIA 04/19/2009   FATIQUE AND MALAISE 04/19/2009   PNEUMONIA 04/18/2009   ARTHRITIS 04/18/2009   OSTEOPOROSIS 04/18/2009   RESPIRATORY ARREST 04/18/2009   SYNCOPE, HX OF 04/18/2009    Past Surgical History:  Procedure Laterality Date   ABDOMINAL HYSTERECTOMY  1978   APPENDECTOMY  1948   BACK SURGERY     x2   CATARACT EXTRACTION, BILATERAL  03-12-12   bilateral   CHOLECYSTECTOMY  1966   THYROID LOBECTOMY  03/17/2012   Procedure: THYROID LOBECTOMY;  Surgeon: Edward Jolly, MD;  Location: [facility];  Service: General;  Laterality: Right;    THYROIDECTOMY  03/17/2012   Procedure: THYROIDECTOMY;  Surgeon: Edward Jolly, MD;  Location: [facility];  Service: General;  Laterality: N/A;     OB History   No obstetric history on file.      Home Medications    Prior to Admission medications   Medication Sig Start Date End Date Taking? Authorizing Provider  aspirin 81 MG tablet Take 81 mg by mouth daily with breakfast.    Yes [provider]  atorvastatin (LIPITOR) 10 MG tablet Take 10 mg by mouth daily.   Yes [provider]  calcium-vitamin D (CALCIUM 500+D) 500-200 MG-UNIT per tablet Take 1 tablet by mouth daily with breakfast.   Yes [provider]  Cholecalciferol (VITAMIN D3) 1000 UNITS CAPS Take 1,000 Units by mouth daily.    Yes [provider]  folic acid (FOLVITE) Q000111Q MCG tablet Take 400 mcg by mouth daily with breakfast.   Yes [provider]  gabapentin (NEURONTIN) 300 MG capsule Take 300 mg by mouth 2 (two) times daily.  06/10/11  Yes [provider]  levothyroxine (SYNTHROID, LEVOTHROID) 137 MCG tablet Take 137 mcg by mouth daily before breakfast.   Yes [provider]  rOPINIRole (REQUIP) 1 MG tablet Take 0.5-1 mg by mouth See admin instructions. Take two 0.5mg  tablets by mouth in the morning, then take 1mg  tablet by mouth at night  per patient 05/17/11  Yes [provider]  sertraline (ZOLOFT) 50 MG tablet Take 50 mg by mouth daily.   Yes [provider]  telmisartan (MICARDIS) 40 MG tablet Take 40 mg by mouth daily.   Yes [provider]    Family History Family History  Problem Relation Age of Onset   Heart disease Mother        heart attack   Stroke Father    Cancer Sister        Thyroid    Social History Social History   Tobacco Use   Smoking status: Never Smoker  Substance Use Topics   Alcohol use: No   Drug use: No     Allergies   Levofloxacin and Statins   Review of Systems Review of Systems    Constitutional: Negative for appetite change, chills and fever.  HENT: Negative for ear pain, rhinorrhea, sneezing and sore throat.   Eyes: Negative for photophobia and visual disturbance.  Respiratory: Negative for cough, chest tightness, shortness of breath and wheezing.   Cardiovascular: Negative for chest pain and palpitations.  Gastrointestinal: Negative for abdominal pain, blood in stool, constipation, diarrhea, nausea and vomiting.  Genitourinary: Negative for dysuria, hematuria and urgency.  Musculoskeletal: Negative for myalgias.  Skin: Positive for wound. Negative for rash.  Neurological: Positive for headaches. Negative for dizziness, weakness and light-headedness.     Physical Exam Updated Vital Signs BP (!) 174/80 (BP Location: Right Arm)    Pulse 74    Temp 98.6 F (37 C) (Oral)    Resp 16    SpO2 99%   Physical Exam Vitals signs and nursing note reviewed.  Constitutional:      General: She is not in acute distress.    Appearance: She is well-developed.  HENT:     Head: Normocephalic and atraumatic.      Comments: Or hematoma noted of the indicated area.  Ecchymosis around left eye without changes to EOMs.    Nose: Nose normal.  Eyes:     General: No scleral icterus.       Left eye: No discharge.     Conjunctiva/sclera: Conjunctivae normal.  Neck:     Musculoskeletal: Normal range of motion and neck supple.  Cardiovascular:     Rate and Rhythm: Normal rate and regular rhythm.     Heart sounds: Normal heart sounds. No murmur. No friction rub. No gallop.   Pulmonary:     Effort: Pulmonary effort is normal. No respiratory distress.     Breath sounds: Normal breath sounds.  Abdominal:     General: Bowel sounds are normal. There is no distension.     Palpations: Abdomen is soft.     Tenderness: There is no abdominal tenderness. There is no guarding.  Musculoskeletal: Normal range of motion.     Comments: No midline spinal tenderness present in lumbar, thoracic  or cervical spine. No step-off palpated. No visible bruising, edema or temperature change noted. No objective signs of numbness present. No saddle anesthesia. 2+ DP pulses bilaterally. Sensation intact to light touch. Strength 5/5 in bilateral lower extremities.  Skin:    General: Skin is warm and dry.     Findings: No rash.  Neurological:     General: No focal deficit present.     Mental Status: She is alert and oriented to person, place, and time.     Cranial Nerves: No cranial nerve deficit.     Sensory: No sensory deficit.  Motor: No weakness or abnormal muscle tone.     Coordination: Coordination normal.      ED Treatments / Results  Labs (all labs ordered are listed, but only abnormal results are displayed) Labs Reviewed  BASIC METABOLIC PANEL - Abnormal; Notable for the following components:      Result Value   Glucose, Bld 102 (*)    GFR calc non Af Amer 55 (*)    All other components within normal limits  CBC WITH DIFFERENTIAL/PLATELET - Abnormal; Notable for the following components:   RBC 3.73 (*)    Hemoglobin 11.4 (*)    All other components within normal limits    EKG None  Radiology Ct Head Wo Contrast  Result Date: 06/30/2019 CLINICAL DATA:  Patient arrives to ED with daughter where she had a fall one 1 hour. Patientt has large hematoma above left eye with avulsion. Patient also has bruising around left eye.Head trauma, no neuro decline, follow up; Head trauma, no neuro decline, follow up. Pt with large hematoma and avulsion to the right head after slipping and falling. EXAM: CT HEAD WITHOUT CONTRAST CT MAXILLOFACIAL WITHOUT CONTRAST TECHNIQUE: Multidetector CT imaging of the head and maxillofacial structures were performed using the standard protocol without intravenous contrast. Multiplanar CT image reconstructions of the maxillofacial structures were also generated. COMPARISON:  09/01/2013 FINDINGS: CT HEAD FINDINGS Brain: There is central and cortical  atrophy. Periventricular white matter changes are consistent with small vessel disease There is no intra or extra-axial fluid collection or mass lesion. The basilar cisterns and ventricles have a normal appearance. There is no CT evidence for acute infarction or hemorrhage. Vascular: There is atherosclerotic calcification of the internal carotid arteries. No hyperdense vessels. Skull: Normal. Negative for fracture or focal lesion. Other: Large LEFT frontal scalp hematoma without underlying fracture. CT MAXILLOFACIAL FINDINGS Osseous: There is a minimally displaced fracture of the LEFT nasal bone. The bony nasal septum is midline. Orbits: There is preseptal soft tissue swelling of the LEFT orbit. The globes are intact. No underlying fracture. Sinuses: Clear. Soft tissues: Significant soft tissue swelling overlying the nose, LATERAL wall of the LEFT orbit, and LEFT temporal region. IMPRESSION: 1. Atrophy and small vessel disease. 2. No evidence for acute intracranial abnormality. 3. Large LEFT frontal scalp hematoma without underlying fracture. 4. Minimally displaced LEFT nasal bone fracture. 5. Preseptal soft tissue swelling of the LEFT orbit. 6. Significant soft tissue swelling overlying the nose, LEFT orbit, and LEFT temporal region. Electronically Signed   By: Nolon Nations M.D.   On: 06/30/2019 19:18   Ct Maxillofacial Wo Contrast  Result Date: 06/30/2019 CLINICAL DATA:  Patient arrives to ED with daughter where she had a fall one 1 hour. Patientt has large hematoma above left eye with avulsion. Patient also has bruising around left eye.Head trauma, no neuro decline, follow up; Head trauma, no neuro decline, follow up. Pt with large hematoma and avulsion to the right head after slipping and falling. EXAM: CT HEAD WITHOUT CONTRAST CT MAXILLOFACIAL WITHOUT CONTRAST TECHNIQUE: Multidetector CT imaging of the head and maxillofacial structures were performed using the standard protocol without intravenous  contrast. Multiplanar CT image reconstructions of the maxillofacial structures were also generated. COMPARISON:  09/01/2013 FINDINGS: CT HEAD FINDINGS Brain: There is central and cortical atrophy. Periventricular white matter changes are consistent with small vessel disease There is no intra or extra-axial fluid collection or mass lesion. The basilar cisterns and ventricles have a normal appearance. There is no CT evidence for acute infarction  or hemorrhage. Vascular: There is atherosclerotic calcification of the internal carotid arteries. No hyperdense vessels. Skull: Normal. Negative for fracture or focal lesion. Other: Large LEFT frontal scalp hematoma without underlying fracture. CT MAXILLOFACIAL FINDINGS Osseous: There is a minimally displaced fracture of the LEFT nasal bone. The bony nasal septum is midline. Orbits: There is preseptal soft tissue swelling of the LEFT orbit. The globes are intact. No underlying fracture. Sinuses: Clear. Soft tissues: Significant soft tissue swelling overlying the nose, LATERAL wall of the LEFT orbit, and LEFT temporal region. IMPRESSION: 1. Atrophy and small vessel disease. 2. No evidence for acute intracranial abnormality. 3. Large LEFT frontal scalp hematoma without underlying fracture. 4. Minimally displaced LEFT nasal bone fracture. 5. Preseptal soft tissue swelling of the LEFT orbit. 6. Significant soft tissue swelling overlying the nose, LEFT orbit, and LEFT temporal region. Electronically Signed   By: Nolon Nations M.D.   On: 06/30/2019 19:18    Procedures Procedures (including critical care time)  Medications Ordered in ED Medications  lidocaine-EPINEPHrine (XYLOCAINE W/EPI) 2 %-1:200000 (PF) injection 10 mL (10 mLs Infiltration Given by Other 06/30/19 1938)     Initial Impression / Assessment and Plan / ED Course  I have reviewed the triage vital signs and the nursing notes.  Pertinent labs & imaging results that were available during my care of the  patient were reviewed by me and considered in my medical decision making (see chart for details).        85 year old female presents to ED for evaluation of mechanical fall that occurred prior to arrival.  States that she lost her balance while trying to empty the cup of her vacuum cleaner.  She reports head injury but denies any loss of consciousness.  Patient has a large hematoma noted with open wound to the left forehead.  Ecchymosis noted around the left eye without changes to EOMs or vision noted.  She is not anticoagulated.  Lab work is unremarkable.  CT shows mildly displaced left nasal bone fracture and wound.  Wound was irrigated, closed with sutures.  Patient states that her tetanus was in the past 5 years.  Will give wound care instructions, follow-up regarding nasal fracture.  We will have her return for worsening symptoms.  Patient is hemodynamically stable, in NAD, and able to ambulate in the ED. Evaluation does not show pathology that would require ongoing emergent intervention or inpatient treatment. I explained the diagnosis to the patient. Pain has been managed and has no complaints prior to discharge. Patient is comfortable with above plan and is stable for discharge at this time. All questions were answered prior to disposition. Strict return precautions for returning to the ED were discussed. Encouraged follow up with PCP.   An After Visit Summary was printed and given to the patient.   Portions of this note were generated with [employer]. Dictation errors may occur despite best attempts at proofreading.   Final Clinical Impressions(s) / ED Diagnoses   Final diagnoses:  Fall in home, initial encounter  Injury of head, initial encounter  Facial laceration, initial encounter  Closed fracture of nasal bone, initial encounter    ED Discharge Orders    None       Delia Heady, Hershal Coria 06/30/19 2012    Quintella Reichert, MD 06/30/19 2256

## 2019-06-30 NOTE — ED Notes (Signed)
Patient verbalizes understanding of discharge instructions. Opportunity for questioning and answers were provided. Armband removed by staff, pt discharged from ED ambulatory to home.  

## 2019-12-04 ENCOUNTER — Ambulatory Visit: Payer: Medicare Other | Attending: Internal Medicine

## 2019-12-04 DIAGNOSIS — Z23 Encounter for immunization: Secondary | ICD-10-CM | POA: Insufficient documentation

## 2019-12-04 NOTE — Progress Notes (Signed)
   Covid-19 Vaccination Clinic  Name:  [name]    MRN: [id number] DOB: [birth year]  12/04/2019  [name] was observed post Covid-19 immunization for 15 minutes without incidence. She was provided with Vaccine Information Sheet and instruction to access the V-Safe system.   [name] was instructed to call 911 with any severe reactions post vaccine: Mar[place] Kitchen Difficulty breathing  . Swelling of your face and throat  . A fast heartbeat  . A bad rash all over your body  . Dizziness and weakness    Immunizations Administered    Name Date Dose VIS Date Route   Pfizer COVID-19 Vaccine 12/04/2019 11:46 AM 0.3 mL 09/24/2019 Intramuscular   Manufacturer: [place]   Lot: [id number]   [place]: [id number]

## 2019-12-28 ENCOUNTER — Ambulatory Visit: Payer: Medicare Other | Attending: Internal Medicine

## 2019-12-28 DIAGNOSIS — Z23 Encounter for immunization: Secondary | ICD-10-CM

## 2019-12-28 NOTE — Progress Notes (Signed)
   Covid-19 Vaccination Clinic  Name:  [name]    MRN: [id number] DOB: [birth year]  12/28/2019  [name] was observed post Covid-19 immunization for 15 minutes without incident. She was provided with Vaccine Information Sheet and instruction to access the V-Safe system.   [name] was instructed to call 911 with any severe reactions post vaccine: Marland Kitchen Difficulty breathing  . Swelling of face and throat  . A fast heartbeat  . A bad rash all over body  . Dizziness and weakness   Immunizations Administered    Name Date Dose VIS Date Route   Pfizer COVID-19 Vaccine 12/28/2019 11:48 AM 0.3 mL 09/24/2019 Intramuscular   Manufacturer: [place]   Lot: [id number]   [place]: [id number]

## 2020-04-25 ENCOUNTER — Other Ambulatory Visit: Payer: Self-pay | Admitting: Nurse Practitioner

## 2020-04-25 DIAGNOSIS — Z1231 Encounter for screening mammogram for malignant neoplasm of breast: Secondary | ICD-10-CM

## 2020-05-08 ENCOUNTER — Ambulatory Visit
Admission: RE | Admit: 2020-05-08 | Discharge: 2020-05-08 | Disposition: A | Discharge status: Home / Self Care | Payer: Medicare Other | Source: Ambulatory Visit | Attending: Nurse Practitioner | Admitting: Nurse Practitioner

## 2020-05-08 ENCOUNTER — Other Ambulatory Visit: Payer: Self-pay

## 2020-05-08 DIAGNOSIS — Z1231 Encounter for screening mammogram for malignant neoplasm of breast: Secondary | ICD-10-CM

## 2020-07-20 ENCOUNTER — Ambulatory Visit (INDEPENDENT_AMBULATORY_CARE_PROVIDER_SITE_OTHER): Payer: Medicare Other | Admitting: Orthopedic Surgery

## 2020-07-20 ENCOUNTER — Ambulatory Visit: Payer: Self-pay

## 2020-07-20 VITALS — Ht 61.0 in | Wt 166.0 lb

## 2020-07-20 DIAGNOSIS — M25561 Pain in right knee: Secondary | ICD-10-CM | POA: Diagnosis not present

## 2020-07-20 DIAGNOSIS — M1711 Unilateral primary osteoarthritis, right knee: Secondary | ICD-10-CM

## 2020-07-22 ENCOUNTER — Encounter: Payer: Self-pay | Admitting: Orthopedic Surgery

## 2020-07-22 NOTE — Progress Notes (Signed)
Office Visit Note   Patient: [name]           Date of Birth: 03/21/1927           MRN: [id number] Visit Date: 07/20/2020 Requested by: Vicenta Aly, [address] [place],  [place] [address] PCP: Vicenta Aly, FNP  Subjective: Chief Complaint  Patient presents with  . Right Knee - Pain    HPI: [name] is a 86 y.o. female who presents to the office complaining of right knee pain.  Patient notes that she has had right knee pain for "a good while".  Pain seems to be worsening.  She notes gradual onset without any injury.  Is been present for at least several months.  She describes a "deep anterior pain".  Denies any significant groin pain, low back pain, numbness/tingling.  She does have a history of 2 back surgeries with multiple ESI's in the past but this does not feel like it is coming from her back according to her.  She has no history of knee surgery or injury.  She does have a history of a blood clot in the left lower extremity.  She is not on any blood thinners.  No mechanical symptoms.  Knee occasionally feels like a muscle give out on her..                ROS: All systems reviewed are negative as they relate to the chief complaint within the history of present illness.  Patient denies fevers or chills.  Assessment & Plan: Visit Diagnoses:  1. Right knee pain, unspecified chronicity   2. Unilateral primary osteoarthritis, right knee     Plan: Patient is a 86 year old female presents complaint of right knee pain.  Radiographs of the right knee were reviewed with patient today.  Radiographs show moderate to severe degenerative changes of the medial compartment primarily.  She has joint line tenderness over the medial joint line on exam today as well.  Impression is that right knee pain is from osteoarthritis.  Discussed options outpatient.  She wishes to proceed with right knee cortisone injection today.  She tolerated the procedure well.  Plan  for patient to follow-up as needed.  We will preapproved her for gel injections.  This patient is diagnosed with osteoarthritis of the knee(s).    Radiographs show evidence of joint space narrowing, osteophytes, subchondral sclerosis and/or subchondral cysts.  This patient has knee pain which interferes with functional and activities of daily living.    This patient has experienced inadequate response, adverse effects and/or intolerance with conservative treatments such as acetaminophen, NSAIDS, topical creams, physical therapy or regular exercise, knee bracing and/or weight loss.   This patient has experienced inadequate response or has a contraindication to intra articular steroid injections for at least 3 months.   This patient is not scheduled to have a total knee replacement within 6 months of starting treatment with viscosupplementation.   Follow-Up Instructions: No follow-ups on file.   Orders:  Orders Placed This Encounter  Procedures  . XR Knee 1-2 Views Right   No orders of the defined types were placed in this encounter.     Procedures: Large Joint Inj: R knee on 07/23/2020 8:40 PM Indications: diagnostic evaluation, joint swelling and pain Details: 18 G 1.5 in needle, superolateral approach  Arthrogram: No  Medications: 5 mL lidocaine 1 %; 40 mg methylPREDNISolone acetate 40 MG/ML; 4 mL bupivacaine 0.25 % Outcome: tolerated well, no  immediate complications Procedure, treatment alternatives, risks and benefits explained, specific risks discussed. Consent was given by the patient. Immediately prior to procedure a time out was called to verify the correct patient, procedure, equipment, support staff and site/side marked as required. Patient was prepped and draped in the usual sterile fashion.       Clinical Data: No additional findings.  Objective: Vital Signs: Ht 5\' 1"  (1.549 m)   Wt 166 lb (75.3 kg)   BMI 31.37 kg/m   Physical Exam:  Constitutional: Patient  appears well-developed HEENT:  Head: Normocephalic Eyes:EOM are normal Neck: Normal range of motion Cardiovascular: Normal rate Pulmonary/chest: Effort normal Neurologic: Patient is alert Skin: Skin is warm Psychiatric: Patient has normal mood and affect  Ortho Exam: Ortho exam demonstrates tenderness and crepitus with right and left knee range of motion.  Pedal pulses palpable.  Extensor mechanism intact.  No groin pain with internal or external rotation of the leg.  Specialty Comments:  No specialty comments available.  Imaging: No results found.   PMFS History: Patient Active Problem List   Diagnosis Date Noted  . Cancer of thyroid ([place]) 04/14/2012  . ANXIETY 05/01/2009  . GERD 05/01/2009  . CHEST PAIN 05/01/2009  . HYPERLIPIDEMIA 04/19/2009  . FATIQUE AND MALAISE 04/19/2009  . PNEUMONIA 04/18/2009  . ARTHRITIS 04/18/2009  . OSTEOPOROSIS 04/18/2009  . RESPIRATORY ARREST 04/18/2009  . SYNCOPE, HX OF 04/18/2009   Past Medical History:  Diagnosis Date  . Arthritis   . GERD (gastroesophageal reflux disease)   . Hyperlipidemia   . Polymyalgia rheumatica ([place])   . Thyroid nodule     Family History  Problem Relation Age of Onset  . Heart disease Mother        heart attack  . Stroke Father   . Cancer Sister        Thyroid    Past Surgical History:  Procedure Laterality Date  . ABDOMINAL HYSTERECTOMY  1978  . APPENDECTOMY  1948  . BACK SURGERY     x2  . CATARACT EXTRACTION, BILATERAL  03-12-12   bilateral  . CHOLECYSTECTOMY  1966  . THYROID LOBECTOMY  03/17/2012   Procedure: THYROID LOBECTOMY;  Surgeon: Edward Jolly, MD;  Location: [facility];  Service: General;  Laterality: Right;  . THYROIDECTOMY  03/17/2012   Procedure: THYROIDECTOMY;  Surgeon: Edward Jolly, MD;  Location: [facility];  Service: General;  Laterality: N/A;   Social History   Occupational History  . Not on file  Tobacco Use  . Smoking status: Never Smoker  Substance and Sexual  Activity  . Alcohol use: No  . Drug use: No  . Sexual activity: Not on file

## 2020-07-23 ENCOUNTER — Encounter: Payer: Self-pay | Admitting: Orthopedic Surgery

## 2020-07-23 DIAGNOSIS — M1711 Unilateral primary osteoarthritis, right knee: Secondary | ICD-10-CM | POA: Diagnosis not present

## 2020-07-23 MED ORDER — METHYLPREDNISOLONE ACETATE 40 MG/ML IJ SUSP
40.0000 mg | INTRAMUSCULAR | Status: AC | PRN
  Administered 2020-07-23: 40 mg via INTRA_ARTICULAR

## 2020-07-23 MED ORDER — BUPIVACAINE HCL 0.25 % IJ SOLN
4.0000 mL | INTRAMUSCULAR | Status: AC | PRN
  Administered 2020-07-23: 4 mL via INTRA_ARTICULAR

## 2020-07-23 MED ORDER — LIDOCAINE HCL 1 % IJ SOLN
5.0000 mL | INTRAMUSCULAR | Status: AC | PRN
  Administered 2020-07-23: 5 mL

## 2022-02-09 ENCOUNTER — Emergency Department (HOSPITAL_COMMUNITY): Payer: Medicare Other

## 2022-02-09 ENCOUNTER — Inpatient Hospital Stay (HOSPITAL_COMMUNITY)
Admission: EM | Admit: 2022-02-09 | Discharge: 2022-02-11 | DRG: 312 | Disposition: A | Discharge status: Home Health | Payer: Medicare Other | Attending: Internal Medicine | Admitting: Internal Medicine

## 2022-02-09 ENCOUNTER — Encounter (HOSPITAL_COMMUNITY): Payer: Self-pay

## 2022-02-09 DIAGNOSIS — Y92009 Unspecified place in unspecified non-institutional (private) residence as the place of occurrence of the external cause: Secondary | ICD-10-CM

## 2022-02-09 DIAGNOSIS — G2581 Restless legs syndrome: Secondary | ICD-10-CM | POA: Diagnosis present

## 2022-02-09 DIAGNOSIS — Z823 Family history of stroke: Secondary | ICD-10-CM

## 2022-02-09 DIAGNOSIS — M810 Age-related osteoporosis without current pathological fracture: Secondary | ICD-10-CM | POA: Diagnosis present

## 2022-02-09 DIAGNOSIS — Z85850 Personal history of malignant neoplasm of thyroid: Secondary | ICD-10-CM

## 2022-02-09 DIAGNOSIS — S92352A Displaced fracture of fifth metatarsal bone, left foot, initial encounter for closed fracture: Secondary | ICD-10-CM | POA: Diagnosis present

## 2022-02-09 DIAGNOSIS — Z881 Allergy status to other antibiotic agents status: Secondary | ICD-10-CM

## 2022-02-09 DIAGNOSIS — I10 Essential (primary) hypertension: Secondary | ICD-10-CM

## 2022-02-09 DIAGNOSIS — Z79899 Other long term (current) drug therapy: Secondary | ICD-10-CM

## 2022-02-09 DIAGNOSIS — M1990 Unspecified osteoarthritis, unspecified site: Secondary | ICD-10-CM | POA: Diagnosis present

## 2022-02-09 DIAGNOSIS — Z23 Encounter for immunization: Secondary | ICD-10-CM

## 2022-02-09 DIAGNOSIS — I83893 Varicose veins of bilateral lower extremities with other complications: Secondary | ICD-10-CM

## 2022-02-09 DIAGNOSIS — M353 Polymyalgia rheumatica: Secondary | ICD-10-CM | POA: Diagnosis present

## 2022-02-09 DIAGNOSIS — I951 Orthostatic hypotension: Secondary | ICD-10-CM | POA: Diagnosis present

## 2022-02-09 DIAGNOSIS — E890 Postprocedural hypothyroidism: Secondary | ICD-10-CM | POA: Diagnosis present

## 2022-02-09 DIAGNOSIS — E039 Hypothyroidism, unspecified: Secondary | ICD-10-CM

## 2022-02-09 DIAGNOSIS — Z7982 Long term (current) use of aspirin: Secondary | ICD-10-CM

## 2022-02-09 DIAGNOSIS — Y92012 Bathroom of single-family (private) house as the place of occurrence of the external cause: Secondary | ICD-10-CM

## 2022-02-09 DIAGNOSIS — I83899 Varicose veins of unspecified lower extremities with other complications: Secondary | ICD-10-CM | POA: Diagnosis present

## 2022-02-09 DIAGNOSIS — Z8249 Family history of ischemic heart disease and other diseases of the circulatory system: Secondary | ICD-10-CM

## 2022-02-09 DIAGNOSIS — D62 Acute posthemorrhagic anemia: Secondary | ICD-10-CM

## 2022-02-09 DIAGNOSIS — E785 Hyperlipidemia, unspecified: Secondary | ICD-10-CM | POA: Diagnosis present

## 2022-02-09 DIAGNOSIS — Z808 Family history of malignant neoplasm of other organs or systems: Secondary | ICD-10-CM

## 2022-02-09 DIAGNOSIS — K219 Gastro-esophageal reflux disease without esophagitis: Secondary | ICD-10-CM | POA: Diagnosis present

## 2022-02-09 DIAGNOSIS — Z888 Allergy status to other drugs, medicaments and biological substances status: Secondary | ICD-10-CM

## 2022-02-09 DIAGNOSIS — Z79890 Hormone replacement therapy (postmenopausal): Secondary | ICD-10-CM

## 2022-02-09 DIAGNOSIS — W19XXXA Unspecified fall, initial encounter: Principal | ICD-10-CM

## 2022-02-09 LAB — URINALYSIS, ROUTINE W REFLEX MICROSCOPIC
Bilirubin Urine: NEGATIVE
Glucose, UA: NEGATIVE mg/dL
Hgb urine dipstick: NEGATIVE
Ketones, ur: NEGATIVE mg/dL
Nitrite: NEGATIVE
Protein, ur: NEGATIVE mg/dL
Specific Gravity, Urine: 1.011 (ref 1.005–1.030)
pH: 7 (ref 5.0–8.0)

## 2022-02-09 LAB — COMPREHENSIVE METABOLIC PANEL
ALT: 14 U/L (ref 0–44)
AST: 19 U/L (ref 15–41)
Albumin: 3.8 g/dL (ref 3.5–5.0)
Alkaline Phosphatase: 68 U/L (ref 38–126)
Anion gap: 6 (ref 5–15)
BUN: 27 mg/dL — ABNORMAL HIGH (ref 8–23)
CO2: 28 mmol/L (ref 22–32)
Calcium: 9.2 mg/dL (ref 8.9–10.3)
Chloride: 106 mmol/L (ref 98–111)
Creatinine, Ser: 1.05 mg/dL — ABNORMAL HIGH (ref 0.44–1.00)
GFR, Estimated: 50 mL/min — ABNORMAL LOW (ref >60)
Glucose, Bld: 111 mg/dL — ABNORMAL HIGH (ref 70–99)
Potassium: 4.4 mmol/L (ref 3.5–5.1)
Sodium: 140 mmol/L (ref 135–145)
Total Bilirubin: 0.6 mg/dL (ref 0.3–1.2)
Total Protein: 6.7 g/dL (ref 6.5–8.1)

## 2022-02-09 LAB — CBC WITH DIFFERENTIAL/PLATELET
Abs Immature Granulocytes: 0.07 10*3/uL (ref 0.00–0.07)
Basophils Absolute: 0.1 10*3/uL (ref 0.0–0.1)
Basophils Relative: 0 %
Eosinophils Absolute: 0.1 10*3/uL (ref 0.0–0.5)
Eosinophils Relative: 1 %
HCT: 33.4 % — ABNORMAL LOW (ref 36.0–46.0)
Hemoglobin: 10.4 g/dL — ABNORMAL LOW (ref 12.0–15.0)
Immature Granulocytes: 1 %
Lymphocytes Relative: 11 %
Lymphs Abs: 1.3 10*3/uL (ref 0.7–4.0)
MCH: 30.9 pg (ref 26.0–34.0)
MCHC: 31.1 g/dL (ref 30.0–36.0)
MCV: 99.1 fL (ref 80.0–100.0)
Monocytes Absolute: 1.3 10*3/uL — ABNORMAL HIGH (ref 0.1–1.0)
Monocytes Relative: 11 %
Neutro Abs: 8.8 10*3/uL — ABNORMAL HIGH (ref 1.7–7.7)
Neutrophils Relative %: 76 %
Platelets: 195 10*3/uL (ref 150–400)
RBC: 3.37 MIL/uL — ABNORMAL LOW (ref 3.87–5.11)
RDW: 12.5 % (ref 11.5–15.5)
WBC: 11.6 10*3/uL — ABNORMAL HIGH (ref 4.0–10.5)
nRBC: 0 % (ref 0.0–0.2)

## 2022-02-09 LAB — PROTIME-INR
INR: 1 (ref 0.8–1.2)
Prothrombin Time: 13.4 seconds (ref 11.4–15.2)

## 2022-02-09 LAB — TROPONIN I (HIGH SENSITIVITY): Troponin I (High Sensitivity): 8 ng/L (ref <18)

## 2022-02-09 LAB — ETHANOL: Alcohol, Ethyl (B): <10 mg/dL (ref <10)

## 2022-02-09 LAB — LACTIC ACID, PLASMA: Lactic Acid, Venous: 1.9 mmol/L (ref 0.5–1.9)

## 2022-02-09 MED ORDER — GABAPENTIN 300 MG PO CAPS
300.0000 mg | ORAL_CAPSULE | Freq: Two times a day (BID) | ORAL | Status: DC
Start: 2022-02-09 — End: 2022-02-10
  Administered 2022-02-09 – 2022-02-10 (×2): 300 mg via ORAL
  Filled 2022-02-09 (×2): qty 1

## 2022-02-09 MED ORDER — CEPHALEXIN 500 MG PO CAPS: 500.0000 mg | ORAL_CAPSULE | Freq: Two times a day (BID) | ORAL | 0 refills | Status: DC

## 2022-02-09 MED ORDER — TETANUS-DIPHTH-ACELL PERTUSSIS 5-2.5-18.5 LF-MCG/0.5 IM SUSY
0.5000 mL | PREFILLED_SYRINGE | Freq: Once | INTRAMUSCULAR | Status: AC
  Administered 2022-02-09: 0.5 mL via INTRAMUSCULAR
  Filled 2022-02-09: qty 0.5

## 2022-02-09 MED ORDER — LACTATED RINGERS IV BOLUS
500.0000 mL | Freq: Once | INTRAVENOUS | Status: AC
  Administered 2022-02-09: 500 mL via INTRAVENOUS

## 2022-02-09 NOTE — ED Triage Notes (Signed)
Patient presents to the ed with c/o fall while getting out of bath tub. Patient sustain laceration on the left hand. Patient denies hitting her head and she is not on blood thinner. Bleeding was controlled upon arrival to the ed. EMS give her 250 ml of NS and 4 of Zofran. ?

## 2022-02-09 NOTE — ED Provider Notes (Signed)
?[facility] DEPT ?Provider Note ? ? ?CSN: [id number] ?Arrival date & time: 02/09/22  1702 ? ?  ? ?History ? ?No chief complaint on file. ? ? ?[name] is a 88 y.o. female. ? ?HPI ?Patient presents after a fall.  Medical history includes HLD, anxiety, GERD, arthritis, osteoporosis, and thyroid cancer.  She is not on a blood thinner.  Prior to arrival, she was at home taking a shower.  The patient does live by herself.  She had a spontaneous bleeding of a varicose vein on her distal RLE.  She went to get out of the tub but slipped on the blood.  She states that the fall was purely mechanical.  She denies any preceding dizziness or lightheadedness.  When she fell, she fell out of the tub, landing on the bathroom floor.  She believes that she struck her left hand in between the toilet and the wall.  This caused a skin tear and some more bleeding.  She did have her phone and range she was able to call her daughter.  EMS arrived on scene and estimates 250 cc of blood loss.  Patient's blood pressure was initially low at 70 SBP.  She was given 250 cc of normal saline prior to arrival.  She has subsequent improvement in her blood pressure to 100s SBP.  Patient remained alert and oriented during transit.  Currently, patient denies any areas of discomfort other than the dorsal aspect of her left hand.  She is unaware of when her last tetanus shot was. ?  ? ?Home Medications ?Prior to Admission medications   ?Medication Sig Start Date End Date Taking? Authorizing Provider  ?aspirin 81 MG tablet Take 81 mg by mouth daily with breakfast.    Yes [provider]  ?atorvastatin (LIPITOR) 10 MG tablet Take 10 mg by mouth daily.   Yes [provider]  ?calcium-vitamin D (OSCAL WITH D) 500-200 MG-UNIT tablet Take 1 tablet by mouth daily with breakfast.   Yes [provider]  ?cephALEXin (KEFLEX) 500 MG capsule Take 1 capsule (500 mg total) by mouth 2 (two) times daily for 5  days. 02/09/22 02/14/22 Yes Godfrey Pick, MD  ?gabapentin (NEURONTIN) 300 MG capsule Take 300 mg by mouth 2 (two) times daily.  06/10/11  Yes [provider]  ?levothyroxine (SYNTHROID) 112 MCG tablet Take 112 mcg by mouth daily. 12/19/21  Yes [provider]  ?rOPINIRole (REQUIP) 0.5 MG tablet Take 0.5 mg by mouth 2 (two) times daily. 01/02/22  Yes [provider]  ?rOPINIRole (REQUIP) 1 MG tablet Take 1 mg by mouth at bedtime. 05/17/11  Yes [provider]  ?telmisartan (MICARDIS) 40 MG tablet Take 40 mg by mouth daily.   Yes [provider]  ?   ? ?Allergies    ?Levofloxacin and Statins   ? ?Review of Systems   ?Review of Systems  ?Skin:  Positive for wound.  ?All other systems reviewed and are negative. ? ?Physical Exam ?Updated Vital Signs ?BP 132/71 (BP Location: Right Arm)   Pulse 86   Temp 97.6 ?F (36.4 ?C) (Oral)   Resp 18   SpO2 95%  ?Physical Exam ?Vitals and nursing note reviewed.  ?Constitutional:   ?   General: She is not in acute distress. ?   Appearance: Normal appearance. She is well-developed and normal weight. She is not ill-appearing, toxic-appearing or diaphoretic.  ?HENT:  ?   Head: Normocephalic and atraumatic.  ?   Right Ear:  External ear normal.  ?   Left Ear: External ear normal.  ?   Nose: Nose normal.  ?   Mouth/Throat:  ?   Mouth: Mucous membranes are moist.  ?   Pharynx: Oropharynx is clear.  ?Eyes:  ?   General: No scleral icterus. ?   Extraocular Movements: Extraocular movements intact.  ?   Conjunctiva/sclera: Conjunctivae normal.  ?Cardiovascular:  ?   Heart sounds: No murmur heard. ?Pulmonary:  ?   Effort: Pulmonary effort is normal. No respiratory distress.  ?   Breath sounds: Normal breath sounds. No wheezing or rales.  ?Abdominal:  ?   Palpations: Abdomen is soft.  ?   Tenderness: There is no abdominal tenderness.  ?Musculoskeletal:     ?   General: No swelling. Normal range of motion.  ?   Cervical back: Normal range of motion and neck  supple. No rigidity or tenderness.  ?   Right lower leg: No edema.  ?   Left lower leg: No edema.  ?Skin: ?   General: Skin is warm and dry.  ?   Capillary Refill: Capillary refill takes less than 2 seconds.  ?   Coloration: Skin is not jaundiced or pale.  ?   Comments: Skin tear to dorsum of left hand.  ?Neurological:  ?   General: No focal deficit present.  ?   Mental Status: She is alert and oriented to person, place, and time.  ?   Cranial Nerves: No cranial nerve deficit.  ?   Sensory: No sensory deficit.  ?   Motor: No weakness.  ?   Coordination: Coordination normal.  ?Psychiatric:     ?   Mood and Affect: Mood normal.     ?   Behavior: Behavior normal.     ?   Thought Content: Thought content normal.     ?   Judgment: Judgment normal.  ? ? ?ED Results / Procedures / Treatments   ?Labs ?(all labs ordered are listed, but only abnormal results are displayed) ?Labs Reviewed  ?COMPREHENSIVE METABOLIC PANEL - Abnormal; Notable for the following components:  ?    Result Value  ? Glucose, Bld 111 (*)   ? BUN 27 (*)   ? Creatinine, Ser 1.05 (*)   ? GFR, Estimated 50 (*)   ? All other components within normal limits  ?URINALYSIS, ROUTINE W REFLEX MICROSCOPIC - Abnormal; Notable for the following components:  ? Leukocytes,Ua MODERATE (*)   ? Bacteria, UA RARE (*)   ? All other components within normal limits  ?CBC WITH DIFFERENTIAL/PLATELET - Abnormal; Notable for the following components:  ? WBC 11.6 (*)   ? RBC 3.37 (*)   ? Hemoglobin 10.4 (*)   ? HCT 33.4 (*)   ? Neutro Abs 8.8 (*)   ? Monocytes Absolute 1.3 (*)   ? All other components within normal limits  ?CBC WITH DIFFERENTIAL/PLATELET - Abnormal; Notable for the following components:  ? RBC 2.92 (*)   ? Hemoglobin 9.3 (*)   ? HCT 28.8 (*)   ? Monocytes Absolute 1.1 (*)   ? All other components within normal limits  ?HEMOGLOBIN AND HEMATOCRIT, BLOOD - Abnormal; Notable for the following components:  ? Hemoglobin 9.4 (*)   ? HCT 29.9 (*)   ? All other components  within normal limits  ?URINE CULTURE  ?LACTIC ACID, PLASMA  ?PROTIME-INR  ?ETHANOL  ?OCCULT BLOOD X 1 CARD TO LAB, STOOL  ?CBC  ?TYPE AND SCREEN  ?  ABO/RH  ?TROPONIN I (HIGH SENSITIVITY)  ?TROPONIN I (HIGH SENSITIVITY)  ? ? ?EKG ?EKG Interpretation ? ?Date/Time:  Saturday February 09 2022 21:03:20 EDT ?Ventricular Rate:  82 ?PR Interval:  188 ?QRS Duration: 98 ?QT Interval:  387 ?QTC Calculation: 452 ?R Axis:   10 ?Text Interpretation: Sinus rhythm Nonspecific T abnormalities, lateral leads Confirmed by Godfrey Pick [phone]) on 02/09/2022 9:17:05 PM ? ?Radiology ?CT HEAD WO CONTRAST ? ?Result Date: 02/09/2022 ?CLINICAL DATA:  Head trauma, fell getting out of bathtub, denies hitting head EXAM: CT HEAD WITHOUT CONTRAST CT CERVICAL SPINE WITHOUT CONTRAST TECHNIQUE: Multidetector CT imaging of the head and cervical spine was performed following the standard protocol without intravenous contrast. Multiplanar CT image reconstructions of the cervical spine were also generated. RADIATION DOSE REDUCTION: This exam was performed according to the departmental dose-optimization program which includes automated exposure control, adjustment of the mA and/or kV according to patient size and/or use of iterative reconstruction technique. COMPARISON:  CT head 06/30/2019 FINDINGS: CT HEAD FINDINGS Brain: Generalized atrophy. Normal ventricular morphology. No midline shift or mass effect. Small vessel chronic ischemic changes of deep cerebral white matter. Old lacunar infarcts at anterior limb RIGHT internal capsule and RIGHT external capsule. No intracranial hemorrhage, mass lesion, evidence of acute infarction, or extra-axial fluid collection. Vascular: No hyperdense vessels Skull: Intact Sinuses/Orbits: Clear Other: N/A CT CERVICAL SPINE FINDINGS Alignment: Normal Skull base and vertebrae: Osseous demineralization. Disc space narrowing and endplate spur formation at C5-C6 and C6-C7. Additional disc space narrowing C3-C4. Multilevel facet  degenerative changes. Vertebral body heights maintained. Skull base intact. Soft tissues and spinal canal: Prevertebral soft tissues normal thickness. Atherosclerotic calcifications at the carotid bifurcations

## 2022-02-09 NOTE — ED Notes (Signed)
Pt ambulated w/o difficulty, upon return to room, pt nauseated, short of breath and grabbed chest. Pt denies pain. MD made aware.  ?

## 2022-02-09 NOTE — ED Notes (Signed)
Pt ambulatory w/o difficulty, denies nausea, shortness of breath or chest pain after ambulation. MD aware.  ?

## 2022-02-10 ENCOUNTER — Other Ambulatory Visit: Payer: Self-pay

## 2022-02-10 ENCOUNTER — Inpatient Hospital Stay (HOSPITAL_COMMUNITY): Payer: Medicare Other

## 2022-02-10 ENCOUNTER — Emergency Department (HOSPITAL_COMMUNITY): Payer: Medicare Other

## 2022-02-10 ENCOUNTER — Encounter (HOSPITAL_COMMUNITY): Payer: Self-pay | Admitting: Internal Medicine

## 2022-02-10 DIAGNOSIS — Z85850 Personal history of malignant neoplasm of thyroid: Secondary | ICD-10-CM | POA: Diagnosis not present

## 2022-02-10 DIAGNOSIS — M810 Age-related osteoporosis without current pathological fracture: Secondary | ICD-10-CM | POA: Diagnosis present

## 2022-02-10 DIAGNOSIS — Z888 Allergy status to other drugs, medicaments and biological substances status: Secondary | ICD-10-CM | POA: Diagnosis not present

## 2022-02-10 DIAGNOSIS — S92355A Nondisplaced fracture of fifth metatarsal bone, left foot, initial encounter for closed fracture: Secondary | ICD-10-CM | POA: Diagnosis not present

## 2022-02-10 DIAGNOSIS — Z79890 Hormone replacement therapy (postmenopausal): Secondary | ICD-10-CM | POA: Diagnosis not present

## 2022-02-10 DIAGNOSIS — Z79899 Other long term (current) drug therapy: Secondary | ICD-10-CM | POA: Diagnosis not present

## 2022-02-10 DIAGNOSIS — I951 Orthostatic hypotension: Secondary | ICD-10-CM

## 2022-02-10 DIAGNOSIS — M1990 Unspecified osteoarthritis, unspecified site: Secondary | ICD-10-CM | POA: Diagnosis present

## 2022-02-10 DIAGNOSIS — I10 Essential (primary) hypertension: Secondary | ICD-10-CM | POA: Diagnosis present

## 2022-02-10 DIAGNOSIS — Z808 Family history of malignant neoplasm of other organs or systems: Secondary | ICD-10-CM | POA: Diagnosis not present

## 2022-02-10 DIAGNOSIS — D62 Acute posthemorrhagic anemia: Secondary | ICD-10-CM

## 2022-02-10 DIAGNOSIS — E890 Postprocedural hypothyroidism: Secondary | ICD-10-CM | POA: Diagnosis present

## 2022-02-10 DIAGNOSIS — I83899 Varicose veins of unspecified lower extremities with other complications: Secondary | ICD-10-CM | POA: Diagnosis present

## 2022-02-10 DIAGNOSIS — Y92012 Bathroom of single-family (private) house as the place of occurrence of the external cause: Secondary | ICD-10-CM | POA: Diagnosis not present

## 2022-02-10 DIAGNOSIS — Z23 Encounter for immunization: Secondary | ICD-10-CM | POA: Diagnosis present

## 2022-02-10 DIAGNOSIS — Z8249 Family history of ischemic heart disease and other diseases of the circulatory system: Secondary | ICD-10-CM | POA: Diagnosis not present

## 2022-02-10 DIAGNOSIS — Z823 Family history of stroke: Secondary | ICD-10-CM | POA: Diagnosis not present

## 2022-02-10 DIAGNOSIS — S92352A Displaced fracture of fifth metatarsal bone, left foot, initial encounter for closed fracture: Secondary | ICD-10-CM | POA: Diagnosis present

## 2022-02-10 DIAGNOSIS — K219 Gastro-esophageal reflux disease without esophagitis: Secondary | ICD-10-CM | POA: Diagnosis present

## 2022-02-10 DIAGNOSIS — E785 Hyperlipidemia, unspecified: Secondary | ICD-10-CM | POA: Diagnosis present

## 2022-02-10 DIAGNOSIS — Z7982 Long term (current) use of aspirin: Secondary | ICD-10-CM | POA: Diagnosis not present

## 2022-02-10 DIAGNOSIS — M353 Polymyalgia rheumatica: Secondary | ICD-10-CM | POA: Diagnosis present

## 2022-02-10 DIAGNOSIS — Y92009 Unspecified place in unspecified non-institutional (private) residence as the place of occurrence of the external cause: Secondary | ICD-10-CM

## 2022-02-10 DIAGNOSIS — Z881 Allergy status to other antibiotic agents status: Secondary | ICD-10-CM | POA: Diagnosis not present

## 2022-02-10 DIAGNOSIS — E039 Hypothyroidism, unspecified: Secondary | ICD-10-CM

## 2022-02-10 DIAGNOSIS — G2581 Restless legs syndrome: Secondary | ICD-10-CM | POA: Diagnosis present

## 2022-02-10 LAB — CBC
HCT: 27.1 % — ABNORMAL LOW (ref 36.0–46.0)
Hemoglobin: 8.5 g/dL — ABNORMAL LOW (ref 12.0–15.0)
MCH: 31 pg (ref 26.0–34.0)
MCHC: 31.4 g/dL (ref 30.0–36.0)
MCV: 98.9 fL (ref 80.0–100.0)
Platelets: 163 10*3/uL (ref 150–400)
RBC: 2.74 MIL/uL — ABNORMAL LOW (ref 3.87–5.11)
RDW: 12.6 % (ref 11.5–15.5)
WBC: 7 10*3/uL (ref 4.0–10.5)
nRBC: 0 % (ref 0.0–0.2)

## 2022-02-10 LAB — CBC WITH DIFFERENTIAL/PLATELET
Abs Immature Granulocytes: 0.04 10*3/uL (ref 0.00–0.07)
Basophils Absolute: 0 10*3/uL (ref 0.0–0.1)
Basophils Relative: 1 %
Eosinophils Absolute: 0.1 10*3/uL (ref 0.0–0.5)
Eosinophils Relative: 1 %
HCT: 28.8 % — ABNORMAL LOW (ref 36.0–46.0)
Hemoglobin: 9.3 g/dL — ABNORMAL LOW (ref 12.0–15.0)
Immature Granulocytes: 1 %
Lymphocytes Relative: 16 %
Lymphs Abs: 1.3 10*3/uL (ref 0.7–4.0)
MCH: 31.8 pg (ref 26.0–34.0)
MCHC: 32.3 g/dL (ref 30.0–36.0)
MCV: 98.6 fL (ref 80.0–100.0)
Monocytes Absolute: 1.1 10*3/uL — ABNORMAL HIGH (ref 0.1–1.0)
Monocytes Relative: 13 %
Neutro Abs: 5.8 10*3/uL (ref 1.7–7.7)
Neutrophils Relative %: 68 %
Platelets: 179 10*3/uL (ref 150–400)
RBC: 2.92 MIL/uL — ABNORMAL LOW (ref 3.87–5.11)
RDW: 12.5 % (ref 11.5–15.5)
WBC: 8.4 10*3/uL (ref 4.0–10.5)
nRBC: 0 % (ref 0.0–0.2)

## 2022-02-10 LAB — TYPE AND SCREEN
ABO/RH(D): A POS
Antibody Screen: NEGATIVE

## 2022-02-10 LAB — ECHOCARDIOGRAM COMPLETE
AR max vel: 1.99 cm2
AV Area VTI: 1.9 cm2
AV Area mean vel: 1.92 cm2
AV Mean grad: 3 mmHg
AV Peak grad: 6 mmHg
Ao pk vel: 1.22 m/s
Area-P 1/2: 3.31 cm2
Calc EF: 59.5 %
S' Lateral: 2 cm
Single Plane A2C EF: 50.7 %
Single Plane A4C EF: 67 %

## 2022-02-10 LAB — HEMOGLOBIN AND HEMATOCRIT, BLOOD
HCT: 29.9 % — ABNORMAL LOW (ref 36.0–46.0)
Hemoglobin: 9.4 g/dL — ABNORMAL LOW (ref 12.0–15.0)

## 2022-02-10 LAB — TROPONIN I (HIGH SENSITIVITY): Troponin I (High Sensitivity): 9 ng/L (ref <18)

## 2022-02-10 LAB — ABO/RH: ABO/RH(D): A POS

## 2022-02-10 LAB — OCCULT BLOOD X 1 CARD TO LAB, STOOL: Fecal Occult Bld: NEGATIVE

## 2022-02-10 MED ORDER — ACETAMINOPHEN 650 MG RE SUPP: 650.0000 mg | Freq: Four times a day (QID) | RECTAL | Status: DC | PRN

## 2022-02-10 MED ORDER — ROPINIROLE HCL 1 MG PO TABS: 0.5000 mg | ORAL_TABLET | Freq: Two times a day (BID) | ORAL | Status: DC

## 2022-02-10 MED ORDER — SODIUM CHLORIDE 0.9 % IV BOLUS
250.0000 mL | Freq: Once | INTRAVENOUS | Status: AC
  Administered 2022-02-10: 250 mL via INTRAVENOUS

## 2022-02-10 MED ORDER — GABAPENTIN 300 MG PO CAPS
300.0000 mg | ORAL_CAPSULE | Freq: Every day | ORAL | Status: DC
  Administered 2022-02-10: 300 mg via ORAL
  Filled 2022-02-10: qty 1

## 2022-02-10 MED ORDER — POLYETHYLENE GLYCOL 3350 17 G PO PACK
17.0000 g | PACK | Freq: Every day | ORAL | Status: DC
  Administered 2022-02-10 – 2022-02-11 (×2): 17 g via ORAL
  Filled 2022-02-10 (×2): qty 1

## 2022-02-10 MED ORDER — ACETAMINOPHEN 325 MG PO TABS: 650.0000 mg | ORAL_TABLET | Freq: Four times a day (QID) | ORAL | Status: DC | PRN

## 2022-02-10 MED ORDER — SODIUM CHLORIDE 0.9 % IV SOLN: INTRAVENOUS | Status: AC

## 2022-02-10 MED ORDER — ROPINIROLE HCL 1 MG PO TABS: 1.0000 mg | ORAL_TABLET | Freq: Every day | ORAL | Status: DC

## 2022-02-10 MED ORDER — LEVOTHYROXINE SODIUM 112 MCG PO TABS
112.0000 ug | ORAL_TABLET | Freq: Every day | ORAL | Status: DC
  Administered 2022-02-10 – 2022-02-11 (×2): 112 ug via ORAL
  Filled 2022-02-10 (×2): qty 1

## 2022-02-10 MED ORDER — ENSURE ENLIVE PO LIQD: 237.0000 mL | Freq: Two times a day (BID) | ORAL | Status: DC

## 2022-02-10 MED ORDER — ATORVASTATIN CALCIUM 10 MG PO TABS
10.0000 mg | ORAL_TABLET | Freq: Every day | ORAL | Status: DC
  Administered 2022-02-10: 10 mg via ORAL
  Filled 2022-02-10 (×2): qty 1

## 2022-02-10 MED ORDER — SODIUM CHLORIDE 0.9 % IV BOLUS
500.0000 mL | Freq: Once | INTRAVENOUS | Status: AC
Start: 2022-02-10 — End: 2022-02-10
  Administered 2022-02-10: 500 mL via INTRAVENOUS

## 2022-02-10 NOTE — ED Notes (Signed)
Left short leg splint applied by ortho tech. Pt notes decrease in pain. +PMS distal to splint. ?

## 2022-02-10 NOTE — ED Notes (Signed)
Pt up to be discharged, pt pale, short of breath and BP decreased.  MD notified.  ?

## 2022-02-10 NOTE — Progress Notes (Signed)
Orthopedic Tech Progress Note ?Patient Details:  ?Margaretha Glassing ?1927/08/13 ?[id number] ? ?Ortho Devices ?Type of Ortho Device: CAM walker ?Ortho Device/Splint Location: left ?Ortho Device/Splint Interventions: Application ?  ?Post Interventions ?Patient Tolerated: Well ?Instructions Provided: Care of device ? ?[place] Pink ?02/10/2022, 2:24 PM ? ?

## 2022-02-10 NOTE — Progress Notes (Signed)
Orthopedic Tech Progress Note ?Patient Details:  ?Margaretha Glassing ?[name] 03, 1927 ?[id number] ? ?Ortho Devices ?Type of Ortho Device: Cotton web roll, Short leg splint ?Ortho Device/Splint Location: LLE ?Ortho Device/Splint Interventions: Ordered, Application ?  ?Post Interventions ?Patient Tolerated: Well ?Instructions Provided: Care of device ? ?Janit Pagan ?02/10/2022, 5:42 AM ? ?

## 2022-02-10 NOTE — ED Notes (Signed)
Ortho tech paged  

## 2022-02-10 NOTE — TOC Initial Note (Signed)
Transition of Care (TOC) - Initial/Assessment Note  ? ? ?Patient Details  ?Name: [name] ?MRN: [id number] ?Date of Birth: 03/06/1927 ? ?Transition of Care (TOC) CM/SW Contact:    ?Tawanna Cooler, RN ?Phone Number: ?02/10/2022, 4:05 PM ? ?Clinical Narrative:                 ? ?Patient lives at home alone.  Normally independent.   ?TOC following for discharge needs.  ? ? ?Expected Discharge Plan: Home/Self Care ?Barriers to Discharge: Continued Medical Work up ? ? ?Expected Discharge Plan and Services ?Expected Discharge Plan: Home/Self Care ?  ?   ?Living arrangements for the past 2 months: [place] ?                ?  ?Prior Living Arrangements/Services ?Living arrangements for the past 2 months: [place] ?Lives with:: Self ?Patient language and need for interpreter reviewed:: Yes ?       ?Need for Family Participation in Patient Care: Yes (Comment) ?Care giver support system in place?: Yes (comment) ?  ?Criminal Activity/Legal Involvement Pertinent to Current Situation/Hospitalization: No - Comment as needed ? ?Activities of Daily Living ?Home Assistive Devices/Equipment: None ?ADL Screening (condition at time of admission) ?Patient's cognitive ability adequate to safely complete daily activities?: Yes ?Is the patient deaf or have difficulty hearing?: No ?Does the patient have difficulty seeing, even when wearing glasses/contacts?: No ?Does the patient have difficulty concentrating, remembering, or making decisions?: No ?Patient able to express need for assistance with ADLs?: Yes ?Does the patient have difficulty dressing or bathing?: No ?Independently performs ADLs?: Yes (appropriate for developmental age) ?Does the patient have difficulty walking or climbing stairs?: No ?Weakness of Legs: None ?Weakness of Arms/Hands: None ? ?Emotional Assessment ?  ?Orientation: : Oriented to Self, Oriented to Place, Oriented to  Time, Oriented to Situation ?Alcohol / Substance Use: Not  Applicable ?Psych Involvement: No (comment) ? ?Admission diagnosis:  Orthostatic hypotension [I95.1] ?Fall, initial encounter [W19.XXXA] ?Patient Active Problem List  ? Diagnosis Date Noted  ? Orthostatic hypotension 02/10/2022  ? Cancer of thyroid ([place]) 04/14/2012  ? ANXIETY 05/01/2009  ? GERD 05/01/2009  ? CHEST PAIN 05/01/2009  ? HYPERLIPIDEMIA 04/19/2009  ? FATIQUE AND MALAISE 04/19/2009  ? PNEUMONIA 04/18/2009  ? ARTHRITIS 04/18/2009  ? OSTEOPOROSIS 04/18/2009  ? RESPIRATORY ARREST 04/18/2009  ? SYNCOPE, HX OF 04/18/2009  ? ?PCP:  Vicenta Aly, [place] ?Pharmacy:   ?[facility] [place], [address] ?[place] [address] ?Phone: [phone] Fax: [phone] ? ?[facility] [place], [address] ?[place] [address] ?Phone: [phone] Fax: [phone] ? ?[id number] [place], [place] - [address] ?[address]?Phone: [phone]: [phone]? ?[id number] [place], [place] [address]Phone: [phone]: [phone] ? ?

## 2022-02-10 NOTE — Discharge Instructions (Addendum)
A prescription for antibiotics was sent to your pharmacy.  This is to avoid an infection on your hand wound.  You should get seen again by your primary care doctor early next week if possible.  If the area of your wound becomes increasingly painful, swollen, or begins draining pus, please return to the emergency department.  There is a telephone number below to call in order to get reestablished with cardiology.  Please return to the emergency department at any time if you experience any new concerning symptoms. ?

## 2022-02-10 NOTE — Evaluation (Signed)
Physical Therapy Evaluation ?Patient Details ?Name: [name] ?MRN: [id number] ?DOB: 04-Feb-1927 ?Today's Date: 02/10/2022 ? ?History of Present Illness ? Patient is a 88 y.o. female presentign to ED after fallin bathroom with bleeding of varicous vein on Rt LE. X-ray revealed minimally displaced fracture at the base of the fifth metatarsal. Pt is now WBAT in CAM boot. ?  ?Clinical Impression ? [name] is 88 y.o. female admitted with above HPI and diagnosis. Patient is currently limited by functional impairments below (see PT problem list). Patient lives alone and is independent at baseline. PT is now WBAT in CAM walking boot and requires RW for balance during gait. Pt able to ambulate ~200' with min guard/assist. Patient will benefit from continued skilled PT interventions to address impairments and progress independence with mobility, recommending HHPT with constant progressing to intermittent assist/supervision if family able to provide. Acute PT will follow and progress as able.    ?   ? ?Recommendations for follow up therapy are one component of a multi-disciplinary discharge planning process, led by the attending physician.  Recommendations may be updated based on patient status, additional functional criteria and insurance authorization. ? ?Follow Up Recommendations Home health PT ? ?  ?Assistance Recommended at Discharge Intermittent Supervision/Assistance  ?Patient can return home with the following ? A little help with walking and/or transfers;A little help with bathing/dressing/bathroom;Assistance with cooking/housework;Direct supervision/assist for medications management;Assist for transportation;Help with stairs or ramp for entrance ? ?  ?Equipment Recommendations None recommended by PT  ?Recommendations for Other Services ?    ?  ?Functional Status Assessment Patient has had a recent decline in their functional status and demonstrates the ability to make significant improvements in function  in a reasonable and predictable amount of time.  ? ?  ?Precautions / Restrictions Precautions ?Precautions: Fall ?Required Braces or Orthoses: Other Brace ?Other Brace: CAM boot ?Restrictions ?Weight Bearing Restrictions: No ?Other Position/Activity Restrictions: WBAT  ? ?  ? ?Mobility ? Bed Mobility ?Overal bed mobility: Needs Assistance ?Bed Mobility: Supine to Sit ?  ?  ?Supine to sit: Supervision, HOB elevated ?  ?  ?General bed mobility comments: extra time, supervision for safety. ?  ? ?Transfers ?Overall transfer level: Needs assistance ?Equipment used: Rolling walker (2 wheels) ?Transfers: Sit to/from Stand ?Sit to Stand: Min guard ?  ?  ?  ?  ?  ?General transfer comment: cues for safe power up with bil UE use on EOB to initiate rise. ?  ? ?Ambulation/Gait ?Ambulation/Gait assistance: Min assist, Min guard ?Gait Distance (Feet): 200 Feet ?Assistive device: Rolling walker (2 wheels) ?Gait Pattern/deviations: Step-through pattern ?Gait velocity: decr-fair ?  ?  ?General Gait Details: cues for safe proximity to RW at start and pt maintained throughout with  min guard, occasional assist to keep walker close during turns. walked short distance in room with no device, min assist as pt slightly unsteady. ? ?Stairs ?  ?  ?  ?  ?  ? ?Wheelchair Mobility ?  ? ?Modified Rankin (Stroke Patients Only) ?  ? ?  ? ?Balance   ?  ?  ?  ?  ?  ?  ?  ?  ?  ?  ?  ?  ?  ?  ?  ?  ?  ?  ?   ? ? ? ?Pertinent Vitals/Pain Pain Assessment ?Pain Assessment: Faces ?Faces Pain Scale: Hurts a little bit ?Pain Location: Lt foot ?Pain Descriptors / Indicators: Aching, Discomfort ?Pain Intervention(s): Limited activity within  patient's tolerance, Monitored during session, Premedicated before session, Repositioned, Ice applied  ? ? ?Home Living Family/patient expects to be discharged to:: Private residence ?Living Arrangements: Alone ?Available Help at Discharge: Family;Available PRN/intermittently ?Type of Home: House ?Home Access: Stairs to  enter ?Entrance Stairs-Rails: Left;Right ?Entrance Stairs-Number of Steps: 5 ?  ?Home Layout: One level ?Home Equipment: Shower seat;Shower seat - built Medical sales representative (2 wheels) ?Additional Comments: pt's daughter can assist intermittently, she assists her husband and has a dog to care for.  ?  ?Prior Function Prior Level of Function : Independent/Modified Independent ?  ?  ?  ?  ?  ?  ?  ?  ?  ? ? ?Hand Dominance  ? Dominant Hand: Right ? ?  ?Extremity/Trunk Assessment  ? Upper Extremity Assessment ?Upper Extremity Assessment: Overall WFL for tasks assessed ?  ? ?Lower Extremity Assessment ?Lower Extremity Assessment: Overall WFL for tasks assessed ?  ? ?Cervical / Trunk Assessment ?Cervical / Trunk Assessment: Normal  ?Communication  ? Communication: No difficulties  ?Cognition Arousal/Alertness: Awake/alert ?Behavior During Therapy: [facility] for tasks assessed/performed ?Overall Cognitive Status: Within Functional Limits for tasks assessed ?  ?  ?  ?  ?  ?  ?  ?  ?  ?  ?  ?  ?  ?  ?  ?  ?  ?  ?  ? ?  ?General Comments   ? ?  ?Exercises    ? ?Assessment/Plan  ?  ?PT Assessment Patient needs continued PT services  ?PT Problem List Decreased activity tolerance;Decreased balance;Decreased mobility;Decreased knowledge of use of DME;Decreased safety awareness;Decreased knowledge of precautions ? ?   ?  ?PT Treatment Interventions Gait training;DME instruction;Stair training;Functional mobility training;Therapeutic activities;Therapeutic exercise;Balance training;Patient/family education   ? ?PT Goals (Current goals can be found in the Care Plan section)  ?Acute Rehab PT Goals ?Patient Stated Goal: get home and back to being independent ?PT Goal Formulation: With patient ?Time For Goal Achievement: 02/24/22 ?Potential to Achieve Goals: Good ? ?  ?Frequency Min 3X/week ?  ? ? ?Co-evaluation   ?  ?  ?  ?  ? ? ?  ?AM-PAC PT "6 Clicks" Mobility  ?Outcome Measure Help needed turning from your back to your side while in a flat  bed without using bedrails?: None ?Help needed moving from lying on your back to sitting on the side of a flat bed without using bedrails?: None ?Help needed moving to and from a bed to a chair (including a wheelchair)?: A Little ?Help needed standing up from a chair using your arms (e.g., wheelchair or bedside chair)?: A Little ?Help needed to walk in hospital room?: A Little ?Help needed climbing 3-5 steps with a railing? : A Little ?6 Click Score: 20 ? ?  ?End of Session Equipment Utilized During Treatment: Gait belt ?Activity Tolerance: Patient tolerated treatment well ?Patient left: in chair;with call bell/phone within reach;with chair alarm set;with family/visitor present ?Nurse Communication: Mobility status ?PT Visit Diagnosis: Muscle weakness (generalized) (M62.81);Difficulty in walking, not elsewhere classified (R26.2);Pain ?Pain - Right/Left: Left ?Pain - part of body: Ankle and joints of foot ?  ? ?Time: 4650-3546 ?PT Time Calculation (min) (ACUTE ONLY): 27 min ? ? ?Charges:   PT Evaluation ?$PT Eval Low Complexity: 1 Low ?PT Treatments ?$Gait Training: 8-22 mins ?  ?   ? ? ?Gwynneth Albright PT, DPT ?Acute Rehabilitation Services ?Office [phone] ?Pager [phone]  ? ?Jacques Navy ?02/10/2022, 4:23 PM ? ?

## 2022-02-10 NOTE — ED Provider Notes (Addendum)
PlacedPatient was discharged prior to my arrival tonight.  Patient's nurse made me aware of the patient when patient became short of breath and weak with dizziness.  This was upon minimal exertion.  Patient found to be hypotensive, blood pressure 89/67.  She was initiated on a small fluid bolus and repeat CBC ordered because patient was here originally with fairly significant amount of bleeding from a ruptured varicose vein.  Hemoglobin has dropped but is in the 9 range now.  She has not received any fluids prior to the small bolus.  I suspect she will equilibrate lower as she is still orthostatic after initial bolus.  A second bolus will be ordered and patient will be admitted as she currently resides alone.  She will need serial H&H. ? ?Patient also complaining of left foot pain now.  X-ray obtained, shows nondisplaced fracture of fifth metatarsal.  Patient placed in short leg splint. ?  ?Orpah Greek, MD ?02/10/22 [phone] ? ?  ?Orpah Greek, MD ?02/10/22 [phone] ? ?

## 2022-02-10 NOTE — H&P (Signed)
?History and Physical  ? ? ?[name] WER:[id number] DOB: [birth year] DOA: 02/09/2022 ? ?PCP: Vicenta Aly, [facility] ? ?Patient coming from: Home ? ?Chief Complaint: Fall ? ?HPI: [name] is a 88 y.o. female with medical history significant of thyroid cancer status post total thyroidectomy, GERD, hypertension, hyperlipidemia, polymyalgia rheumatica, RLS, anxiety presenting after a fall in the bathroom.  Patient had spontaneous bleeding of a varicose vein on her distal right lower extremity and EMS noted approximately 250 cc blood loss. Initially hypotensive with systolic blood pressure 70 and was given a 250 cc bolus by EMS after which systolic blood pressure improved to 100s.  Labs showing WBC 11.6.  Hemoglobin 10.4, ranging between 11.8-12.4 on labs done last year.  BUN 27, creatinine 1.0.  Blood ethanol level undetectable.  Lactic acid normal.  Troponin negative x2.  UA with negative nitrite, moderate amount of leukocytes, and microscopy showing 0-5 WBCs and rare bacteria.  Chest x-ray showing no active disease.  Pelvic x-ray negative for acute finding.  X-ray of left hand negative for fracture.  CT head and C-spine negative for acute finding. Plan was to discharge the patient but she became short of breath and weak with dizziness upon minimal exertion.  She was found to be hypotensive with systolic in the 19J and was given fluid boluses.  Repeat CBC showing hemoglobin 9.3, WBC 8.4.  Patient also complained of left foot pain and x-ray showing minimally displaced fracture at the base of the fifth metatarsal.  Placed in short leg splint.  Had a skin tear on her left hand and Tdap injection given.  Patient received a total of 1250 cc IV fluids in the ED.  She was also given gabapentin.  TRH called to admit. ? ?Patient states she was in her tub taking a shower and after she finished she was drying off her body with a towel when all of a sudden a varicose vein on her right lower leg suddenly started bleeding.   Reports a lot of blood loss at home but the bleeding has now resolved.  As her leg was bleeding, she did not know how to get out of the tub so she placed her left foot on the floor and kept the right foot still inside the tub.  She then grabbed her phone which was placed near the tub to call her daughter.  After she spoke to her daughter, she used her left hand to reach out to a handle or some object close to the toilet but somehow sustained a skin tear on the dorsum of the hand.  She is not sure how she fell but does recall being on the bathroom floor when her daughter arrived and was told by her daughter that she was not very responsive initially.  She thinks she might have passed out.  Denies preceding lightheadedness/dizziness, chest pain, shortness of breath, or palpitations.  She does report intermittent episodes of dark stools but denies hematemesis.  She thinks she has lost some weight recently as her appetite is poor.  Denies vomiting, diarrhea, abdominal pain, or urinary symptoms. ? ?Review of Systems:  ?Review of Systems  ?All other systems reviewed and are negative. ? ?Past Medical History:  ?Diagnosis Date  ? Arthritis   ? GERD (gastroesophageal reflux disease)   ? Hyperlipidemia   ? Polymyalgia rheumatica ([place])   ? Thyroid nodule   ? ? ?Past Surgical History:  ?Procedure Laterality Date  ? ABDOMINAL HYSTERECTOMY  1978  ? APPENDECTOMY  1948  ? BACK SURGERY    ? x2  ? CATARACT EXTRACTION, BILATERAL  03-12-12  ? bilateral  ? CHOLECYSTECTOMY  1966  ? THYROID LOBECTOMY  03/17/2012  ? Procedure: THYROID LOBECTOMY;  Surgeon: Edward Jolly, MD;  Location: [facility];  Service: General;  Laterality: Right;  ? THYROIDECTOMY  03/17/2012  ? Procedure: THYROIDECTOMY;  Surgeon: Edward Jolly, MD;  Location: [facility];  Service: General;  Laterality: N/A;  ? ? ? reports that she has never smoked. She does not have any smokeless tobacco history on file. She reports that she does not drink alcohol and does not use  drugs. ? ?Allergies  ?Allergen Reactions  ? Levofloxacin   ?  REACTION: nausea  ? Statins   ?  unknown  ? ? ?Family History  ?Problem Relation Age of Onset  ? Heart disease Mother   ?     heart attack  ? Stroke Father   ? Cancer Sister   ?     Thyroid  ? ? ?Prior to Admission medications   ?Medication Sig Start Date End Date Taking? Authorizing Provider  ?aspirin 81 MG tablet Take 81 mg by mouth daily with breakfast.    Yes [provider]  ?atorvastatin (LIPITOR) 10 MG tablet Take 10 mg by mouth daily.   Yes [provider]  ?calcium-vitamin D (OSCAL WITH D) 500-200 MG-UNIT tablet Take 1 tablet by mouth daily with breakfast.   Yes [provider]  ?cephALEXin (KEFLEX) 500 MG capsule Take 1 capsule (500 mg total) by mouth 2 (two) times daily for 5 days. 02/09/22 02/14/22 Yes Godfrey Pick, MD  ?gabapentin (NEURONTIN) 300 MG capsule Take 300 mg by mouth 2 (two) times daily.  06/10/11  Yes [provider]  ?levothyroxine (SYNTHROID) 112 MCG tablet Take 112 mcg by mouth daily. 12/19/21  Yes [provider]  ?rOPINIRole (REQUIP) 0.5 MG tablet Take 0.5 mg by mouth 2 (two) times daily. 01/02/22  Yes [provider]  ?rOPINIRole (REQUIP) 1 MG tablet Take 1 mg by mouth at bedtime. 05/17/11  Yes [provider]  ?telmisartan (MICARDIS) 40 MG tablet Take 40 mg by mouth daily.   Yes [provider]  ? ? ?Physical Exam: ?Vitals:  ? 02/10/22 0330 02/10/22 0400 02/10/22 0430 02/10/22 0500  ?BP: 113/78 119/80 135/68 116/65  ?Pulse: 86 92 89 91  ?Resp: '16 16 16 16  '$ ?Temp:      ?TempSrc:      ?SpO2: 90% 93% (!) 87% 96%  ? ? ?Physical Exam ?Vitals reviewed.  ?HENT:  ?   Head: Normocephalic and atraumatic.  ?Eyes:  ?   Extraocular Movements: Extraocular movements intact.  ?   Conjunctiva/sclera: Conjunctivae normal.  ?Cardiovascular:  ?   Rate and Rhythm: Normal rate and regular rhythm.  ?   Pulses: Normal pulses.  ?Pulmonary:  ?   Effort: Pulmonary effort is normal. No  respiratory distress.  ?   Breath sounds: Normal breath sounds.  ?Abdominal:  ?   General: Bowel sounds are normal. There is no distension.  ?   Palpations: Abdomen is soft.  ?   Tenderness: There is no abdominal tenderness.  ?Musculoskeletal:  ?   Cervical back: Normal range of motion.  ?Skin: ?   General: Skin is warm and dry.  ?Neurological:  ?   General: No focal deficit present.  ?   Mental Status: She is alert and oriented to person, place, and time.  ?  ? ?Labs  on Admission: I have personally reviewed following labs and imaging studies ? ?CBC: ?Recent Labs  ?Lab 02/09/22 ?1850 02/10/22 ?0150  ?WBC 11.6* 8.4  ?NEUTROABS 8.8* 5.8  ?HGB 10.4* 9.3*  ?HCT 33.4* 28.8*  ?MCV 99.1 98.6  ?PLT 195 179  ? ?Basic Metabolic Panel: ?Recent Labs  ?Lab 02/09/22 ?1850  ?NA 140  ?K 4.4  ?CL 106  ?CO2 28  ?GLUCOSE 111*  ?BUN 27*  ?CREATININE 1.05*  ?CALCIUM 9.2  ? ?GFR: ?CrCl cannot be calculated (Unknown ideal weight.). ?Liver Function Tests: ?Recent Labs  ?Lab 02/09/22 ?1850  ?AST 19  ?ALT 14  ?ALKPHOS 68  ?BILITOT 0.6  ?PROT 6.7  ?ALBUMIN 3.8  ? ?No results for input(s): LIPASE, AMYLASE in the last 168 hours. ?No results for input(s): AMMONIA in the last 168 hours. ?Coagulation Profile: ?Recent Labs  ?Lab 02/09/22 ?1850  ?INR 1.0  ? ?Cardiac Enzymes: ?No results for input(s): CKTOTAL, CKMB, CKMBINDEX, TROPONINI in the last 168 hours. ?BNP (last 3 results) ?No results for input(s): PROBNP in the last 8760 hours. ?HbA1C: ?No results for input(s): HGBA1C in the last 72 hours. ?CBG: ?No results for input(s): GLUCAP in the last 168 hours. ?Lipid Profile: ?No results for input(s): CHOL, HDL, LDLCALC, TRIG, CHOLHDL, LDLDIRECT in the last 72 hours. ?Thyroid Function Tests: ?No results for input(s): TSH, T4TOTAL, FREET4, T3FREE, THYROIDAB in the last 72 hours. ?Anemia Panel: ?No results for input(s): VITAMINB12, FOLATE, FERRITIN, TIBC, IRON, RETICCTPCT in the last 72 hours. ?Urine analysis: ?   ?Component Value Date/Time  ?  [place] YELLOW 02/09/2022 2326  ? APPEARANCEUR CLEAR 02/09/2022 2326  ? LABSPEC 1.011 02/09/2022 2326  ? PHURINE 7.0 02/09/2022 2326  ? GLUCOSEU NEGATIVE 02/09/2022 2326  ? [place] NEGATIVE 02/09/2022 2326  ? BILIR

## 2022-02-10 NOTE — Progress Notes (Signed)
Patient admitted to the hospital earlier this morning by Dr. Marlowe Sax. ? ?Patient seen and examined.  She is sitting up in bed.  Bleeding from her varicose veins has stopped.  She has not stood up again since she left the emergency room.  Overall, she does not have any complaints at this time. ? ?She was admitted after having a fall and passing out.  She was noted to be orthostatic which likely caused her symptoms.  Orthostasis developed after she had spontaneous bleeding from a varicose vein with significant amount of blood loss.  She was evaluated in the emergency room where initial hemoglobin was only mildly decreased from baseline, but she was significantly orthostatic.  She did receive IV fluids and was recommended that she be observed since it was expected that her hemoglobin will trend down with further IV hydration.  She will need to be monitored to see if she needs to PRBC transfusion.  She did sustain a left fifth metatarsal fracture.  This was discussed with orthopedics who recommended either postop shoe or cam walker and weightbearing as tolerated.  She can follow-up with orthopedics in the next 1 to 2 weeks.  PT OT.  Anticipate discharge in next 24 hours if hemodynamics and hemoglobin remained stable. ? ?Kathie Dike ? ?

## 2022-02-10 NOTE — Progress Notes (Signed)
Patient back in bed at this time. Pt took off her CAM Boot by herself while in bed, states she does not like it and does not want to wear it ever again. Pt educated that Ortho MD ordered this device for her specifically, pt c/o it feeling too heavy and uncomfortable. States her toe does not hurt bad enough to wear boot. Will continue to monitor and encourage CAM boot use.  ?

## 2022-02-11 DIAGNOSIS — D62 Acute posthemorrhagic anemia: Secondary | ICD-10-CM

## 2022-02-11 DIAGNOSIS — I10 Essential (primary) hypertension: Secondary | ICD-10-CM

## 2022-02-11 DIAGNOSIS — S92355A Nondisplaced fracture of fifth metatarsal bone, left foot, initial encounter for closed fracture: Secondary | ICD-10-CM

## 2022-02-11 LAB — URINE CULTURE

## 2022-02-11 LAB — BASIC METABOLIC PANEL
Anion gap: 3 — ABNORMAL LOW (ref 5–15)
BUN: 21 mg/dL (ref 8–23)
CO2: 28 mmol/L (ref 22–32)
Calcium: 8.6 mg/dL — ABNORMAL LOW (ref 8.9–10.3)
Chloride: 110 mmol/L (ref 98–111)
Creatinine, Ser: 0.85 mg/dL (ref 0.44–1.00)
GFR, Estimated: >60 mL/min (ref >60)
Glucose, Bld: 105 mg/dL — ABNORMAL HIGH (ref 70–99)
Potassium: 4.2 mmol/L (ref 3.5–5.1)
Sodium: 141 mmol/L (ref 135–145)

## 2022-02-11 LAB — CBC
HCT: 26.9 % — ABNORMAL LOW (ref 36.0–46.0)
Hemoglobin: 8.4 g/dL — ABNORMAL LOW (ref 12.0–15.0)
MCH: 30.9 pg (ref 26.0–34.0)
MCHC: 31.2 g/dL (ref 30.0–36.0)
MCV: 98.9 fL (ref 80.0–100.0)
Platelets: 163 10*3/uL (ref 150–400)
RBC: 2.72 MIL/uL — ABNORMAL LOW (ref 3.87–5.11)
RDW: 12.6 % (ref 11.5–15.5)
WBC: 6.6 10*3/uL (ref 4.0–10.5)
nRBC: 0 % (ref 0.0–0.2)

## 2022-02-11 NOTE — Discharge Summary (Signed)
Physician Discharge Summary  ?[name] UMP:[id number] DOB: 1927-03-16 DOA: 02/09/2022 ? ?PCP: Vicenta Aly, [place] ? ?Admit date: 02/09/2022 ?Discharge date: 02/11/2022 ? ?Admitted From: Home ?Disposition: Home ? ?Recommendations for Outpatient Follow-up:  ?Follow up with PCP in 3 to 4 days for wound check ?Please obtain BMP/CBC in one week ?Follow-up with orthopedics, Dr. Marlou Sa in 1 to 2 weeks ?Patient has been referred to vascular surgery for further management of varicose veins ? ?Home Health: Home health PT ?Equipment/Devices: Postop shoe ? ?Discharge Condition: Stable ?CODE STATUS: Full code ?Diet recommendation: Heart healthy ? ?Brief/Interim Summary: ?88 year old female with a history of hypothyroidism, hypertension, hyperlipidemia, varicose veins, admitted to the hospital after she had a fall.  Patient was in her shower, when she started to develop bleeding from her varicose veins.  She had a large amount of blood loss.  Patient ended up falling, suffering a fracture of left fifth metatarsal.  She was brought to the ER for evaluation where she was noted to be significantly orthostatic.  Hemoglobin was initially 10.4, but it was felt this was likely falsely elevated since he was significantly orthostatic, implying that hemoglobin did not reflect her acute blood loss.  She was kept overnight in the hospital due to orthostatic hypotension and she was started on IV fluids.  Hemoglobin did trend down to 8.5, but has remained stable since then.  She has not had any further bleeding from her varicose veins.  Orthostatics rechecked the following morning and are negative.  She will be referred to vascular surgery to help further manage her varicose veins. ? ?Regarding her metatarsal fracture, it was reviewed with orthopedics with recommendations for postop shoe, weightbearing as tolerated and follow-up with orthopedics in the next 1 to 2 weeks.  Patient has been working with physical therapy and will be set up with  home health PT.  Appears to be ambulating well.  Pain appears to be reasonably controlled.  She is felt stable for discharge. ? ?Discharge Diagnoses:  ?Principal Problem: ?  Orthostatic hypotension ?Active Problems: ?  HLD (hyperlipidemia) ?  Acute posthemorrhagic anemia ?  Closed fracture of fifth metatarsal bone of left foot ?  Fall at home, initial encounter ?  Hypothyroidism ?  HTN (hypertension) ? ? ? ?Discharge Instructions ? ?Discharge Instructions   ? ? Ambulatory referral to Vascular Surgery   Complete by: As directed ?  ? Varicose veins  ? Diet - low sodium heart healthy   Complete by: As directed ?  ? Increase activity slowly   Complete by: As directed ?  ? ?  ? ?Allergies as of 02/11/2022   ? ?   Reactions  ? Levofloxacin   ? REACTION: nausea  ? Statins   ? unknown  ? ?  ? ?  ?Medication List  ?  ? ?TAKE these medications   ? ?aspirin 81 MG tablet ?Take 81 mg by mouth daily with breakfast. ?  ?atorvastatin 10 MG tablet ?Commonly known as: LIPITOR ?Take 10 mg by mouth daily. ?  ?calcium-vitamin D 500-200 MG-UNIT tablet ?Commonly known as: OSCAL WITH D ?Take 1 tablet by mouth daily with breakfast. ?  ?gabapentin 300 MG capsule ?Commonly known as: NEURONTIN ?Take 300 mg by mouth 2 (two) times daily. ?  ?levothyroxine 112 MCG tablet ?Commonly known as: SYNTHROID ?Take 112 mcg by mouth daily. ?  ?rOPINIRole 1 MG tablet ?Commonly known as: REQUIP ?Take 1 mg by mouth at bedtime. ?  ?rOPINIRole 0.5 MG tablet ?Commonly known as:  REQUIP ?Take 0.5 mg by mouth 2 (two) times daily. ?  ?telmisartan 40 MG tablet ?Commonly known as: MICARDIS ?Take 40 mg by mouth daily. ?  ? ?  ? ? Follow-up Information   ? ? Vicenta Aly, [place]. Schedule an appointment as soon as possible for a visit in 3 days.   ?Specialty: Nurse Practitioner ?Why: For wound re-check ?Contact information: ?[address] ?[address] ?[place] [address] ?[phone] ? ? ?  ?  ? ? Meredith Pel, MD. Schedule an appointment as soon as possible  for a visit in 2 week(s).   ?Specialty: Orthopedic Surgery ?Why: follow up for your left foot ?Contact information: ?[address] ?[place] [address] ?[phone] ? ? ?  ?  ? ? [facility] Follow up.   ?Why: vein specialist office will call you with an appointment ? ?  ?  ? ?  ?  ? ?  ? ?Allergies  ?Allergen Reactions  ? Levofloxacin   ?  REACTION: nausea  ? Statins   ?  unknown  ? ? ?Consultations: ? ? ? ?Procedures/Studies: ?CT HEAD WO CONTRAST ? ?Result Date: 02/09/2022 ?CLINICAL DATA:  Head trauma, fell getting out of bathtub, denies hitting head EXAM: CT HEAD WITHOUT CONTRAST CT CERVICAL SPINE WITHOUT CONTRAST TECHNIQUE: Multidetector CT imaging of the head and cervical spine was performed following the standard protocol without intravenous contrast. Multiplanar CT image reconstructions of the cervical spine were also generated. RADIATION DOSE REDUCTION: This exam was performed according to the departmental dose-optimization program which includes automated exposure control, adjustment of the mA and/or kV according to patient size and/or use of iterative reconstruction technique. COMPARISON:  CT head 06/30/2019 FINDINGS: CT HEAD FINDINGS Brain: Generalized atrophy. Normal ventricular morphology. No midline shift or mass effect. Small vessel chronic ischemic changes of deep cerebral white matter. Old lacunar infarcts at anterior limb RIGHT internal capsule and RIGHT external capsule. No intracranial hemorrhage, mass lesion, evidence of acute infarction, or extra-axial fluid collection. Vascular: No hyperdense vessels Skull: Intact Sinuses/Orbits: Clear Other: N/A CT CERVICAL SPINE FINDINGS Alignment: Normal Skull base and vertebrae: Osseous demineralization. Disc space narrowing and endplate spur formation at C5-C6 and C6-C7. Additional disc space narrowing C3-C4. Multilevel facet degenerative changes. Vertebral body heights maintained. Skull base intact. Soft tissues and spinal canal:  Prevertebral soft tissues normal thickness. Atherosclerotic calcifications at the carotid bifurcations and proximal great vessels. Prior thyroidectomy. Disc levels:  Unremarkable Upper chest: Lung apices clear Other: N/A IMPRESSION: Atrophy with small vessel chronic ischemic changes of deep cerebral white matter. Old lacunar infarcts. No acute intracranial abnormalities. Multilevel degenerative disc and facet disease changes of the cervical spine. No acute cervical spine abnormalities. Electronically Signed   By: Lavonia Dana M.D.   On: 02/09/2022 18:14  ? ?CT CERVICAL SPINE WO CONTRAST ? ?Result Date: 02/09/2022 ?CLINICAL DATA:  Head trauma, fell getting out of bathtub, denies hitting head EXAM: CT HEAD WITHOUT CONTRAST CT CERVICAL SPINE WITHOUT CONTRAST TECHNIQUE: Multidetector CT imaging of the head and cervical spine was performed following the standard protocol without intravenous contrast. Multiplanar CT image reconstructions of the cervical spine were also generated. RADIATION DOSE REDUCTION: This exam was performed according to the departmental dose-optimization program which includes automated exposure control, adjustment of the mA and/or kV according to patient size and/or use of iterative reconstruction technique. COMPARISON:  CT head 06/30/2019 FINDINGS: CT HEAD FINDINGS Brain: Generalized atrophy. Normal ventricular morphology. No midline shift or mass effect. Small vessel chronic ischemic  changes of deep cerebral white matter. Old lacunar infarcts at anterior limb RIGHT internal capsule and RIGHT external capsule. No intracranial hemorrhage, mass lesion, evidence of acute infarction, or extra-axial fluid collection. Vascular: No hyperdense vessels Skull: Intact Sinuses/Orbits: Clear Other: N/A CT CERVICAL SPINE FINDINGS Alignment: Normal Skull base and vertebrae: Osseous demineralization. Disc space narrowing and endplate spur formation at C5-C6 and C6-C7. Additional disc space narrowing C3-C4.  Multilevel facet degenerative changes. Vertebral body heights maintained. Skull base intact. Soft tissues and spinal canal: Prevertebral soft tissues normal thickness. Atherosclerotic calcifications at the carot

## 2022-02-11 NOTE — TOC Progression Note (Signed)
Transition of Care (TOC) - Progression Note  ? ? ?Patient Details  ?Name: [name] ?MRN: [id number] ?Date of Birth: 05-29-1927 ? ?Transition of Care (TOC) CM/SW Contact  ?Purcell Mouton, RN ?Phone Number: ?02/11/2022, 2:48 PM ? ?Clinical Narrative:    ?Spoke with pt concerning [place]. Pt was okay with HH, [facility] was selected for HHPT. Referral given to in house rep. Pt's daughter was called with no answer.  ? ? ?Expected Discharge Plan: Home/Self Care ?Barriers to Discharge: Continued Medical Work up ? ?Expected Discharge Plan and Services ?Expected Discharge Plan: Home/Self Care ?  ?  ?  ?Living arrangements for the past 2 months: [place] ?Expected Discharge Date: 02/11/22               ?  ?  ?  ?  ?  ?  ?  ?  ?  ?  ? ? ?Social Determinants of Health (SDOH) Interventions ?  ? ?Readmission Risk Interventions ?   ? View : No data to display.  ?  ?  ?  ? ? ?

## 2022-02-11 NOTE — Evaluation (Signed)
Occupational Therapy Evaluation ?Patient Details ?Name: [name] ?MRN: [id number] ?DOB: [birth year] ?Today's Date: 02/11/2022 ? ? ?History of Present Illness Patient is a 88 y.o. female presentign to ED after fallin bathroom with bleeding of varicous vein on Rt LE. X-ray revealed minimally displaced fracture at the base of the fifth metatarsal. Pt is now WBAT in CAM boot.  ? ?Clinical Impression ?  ?Patient is a pleasant 88 year old female who was admitted for above. Patient was noted to have increased pain in L lateral foot, decreased functional activity tolerance, decreased endurance, decreased standing balance impacting participation in ADLs. Patient has CAM boot in room that puts pressure on L lateral side of foot per patient report. MD updated and awaiting post op shoe at this time. Patient would continue to benefit from skilled OT services at this time while admitted and after d/c to address noted deficits in order to improve overall safety and independence in ADLs.  ?  ?   ? ?Recommendations for follow up therapy are one component of a multi-disciplinary discharge planning process, led by the attending physician.  Recommendations may be updated based on patient status, additional functional criteria and insurance authorization.  ? ?Follow Up Recommendations ? Home health OT  ?  ?Assistance Recommended at Discharge Intermittent Supervision/Assistance  ?Patient can return home with the following A little help with bathing/dressing/bathroom;Assistance with cooking/housework;Direct supervision/assist for financial management;Help with stairs or ramp for entrance;Assist for transportation;Direct supervision/assist for medications management ? ?  ?Functional Status Assessment ? Patient has had a recent decline in their functional status and demonstrates the ability to make significant improvements in function in a reasonable and predictable amount of time.  ?Equipment Recommendations ? None recommended by OT  ?   ?Recommendations for Other Services   ? ? ?  ?Precautions / Restrictions Precautions ?Precautions: Fall ?Required Braces or Orthoses: Other Brace ?Other Brace: CAM boot ?Restrictions ?Weight Bearing Restrictions: No ?Other Position/Activity Restrictions: WBAT  ? ?  ? ?Mobility Bed Mobility ?Overal bed mobility: Needs Assistance ?Bed Mobility: Supine to Sit ?  ?  ?Supine to sit: Supervision, HOB elevated ?  ?  ?General bed mobility comments: extra time, supervision for safety. ?  ? ?Transfers ?  ?  ?  ?  ?  ?  ?  ?  ?  ?  ?  ? ?  ?Balance Overall balance assessment: Mild deficits observed, not formally tested ?  ?  ?  ?  ?  ?  ?  ?  ?  ?  ?  ?  ?  ?  ?  ?  ?  ?  ?   ? ?ADL either performed or assessed with clinical judgement  ? ?ADL Overall ADL's : Needs assistance/impaired ?Eating/Feeding: Supervision/ safety;Sitting ?  ?Grooming: Wash/dry face;Oral care;Set up;Supervision/safety;Standing ?Grooming Details (indicate cue type and reason): with RW ?Upper Body Bathing: Supervision/ safety;Sitting ?  ?Lower Body Bathing: Supervison/ safety;Set up;Sit to/from stand;Sitting/lateral leans ?  ?Upper Body Dressing : Supervision/safety;Set up;Sitting ?  ?Lower Body Dressing: Minimal assistance;Sit to/from stand;Sitting/lateral leans ?Lower Body Dressing Details (indicate cue type and reason): max A to don CAM boot, supervision to don/doff socks sititng on edge of bed. ?Toilet Transfer: Supervision/safety;Set up;Rolling walker (2 wheels);Ambulation;Regular Toilet ?Toilet Transfer Details (indicate cue type and reason): patient needed SUp with CAM boot and RW ?Toileting- Clothing Manipulation and Hygiene: Supervision/safety;Sit to/from stand ?  ?  ?  ?Functional mobility during ADLs: Supervision/safety;Rolling walker (2 wheels) ?   ? ? ? ?  Vision Patient Visual Report: No change from baseline ?Additional Comments: has glasses  ?   ?Perception   ?  ?Praxis   ?  ? ?Pertinent Vitals/Pain Pain Assessment ?Pain Assessment:  Faces ?Faces Pain Scale: Hurts a little bit ?Pain Location: Lt foot ?Pain Descriptors / Indicators: Aching, Discomfort ?Pain Intervention(s): Limited activity within patient's tolerance, Monitored during session, Premedicated before session, Repositioned  ? ? ? ?Hand Dominance Right ?  ?Extremity/Trunk Assessment Upper Extremity Assessment ?Upper Extremity Assessment: RUE deficits/detail ?RUE Deficits / Details: noted to have a dressing on posterior palm with patient reporting havingsheared teh skin prior to admission ?  ?Lower Extremity Assessment ?Lower Extremity Assessment: Defer to PT evaluation ?  ?Cervical / Trunk Assessment ?Cervical / Trunk Assessment: Normal ?  ?Communication Communication ?Communication: No difficulties ?  ?Cognition Arousal/Alertness: Awake/alert ?Behavior During Therapy: [facility] for tasks assessed/performed ?Overall Cognitive Status: Within Functional Limits for tasks assessed ?  ?  ?  ?  ?  ?  ?  ?  ?  ?  ?  ?  ?  ?  ?  ?  ?  ?  ?  ?General Comments    ? ?  ?Exercises   ?  ?Shoulder Instructions    ? ? ?Home Living Family/patient expects to be discharged to:: Private residence ?Living Arrangements: Alone ?Available Help at Discharge: Family;Available PRN/intermittently ?Type of Home: House ?Home Access: Stairs to enter ?Entrance Stairs-Number of Steps: 5 ?  ?Home Layout: One level ?  ?  ?Bathroom Shower/Tub: Gaffer;Tub only ?  ?Bathroom Toilet: Standard ?Bathroom Accessibility: Yes ?  ?Home Equipment: Shower seat;Shower seat - built Medical sales representative (2 wheels) ?  ?Additional Comments: pt's daughter can assist intermittently, she assists her husband and has a dog to care for. ?  ? ?  ?Prior Functioning/Environment Prior Level of Function : Independent/Modified Independent ?  ?  ?  ?  ?  ?  ?  ?  ?  ? ?  ?  ?OT Problem List: Decreased activity tolerance;Impaired balance (sitting and/or standing);Decreased safety awareness;Pain;Decreased knowledge of precautions;Decreased knowledge of  use of DME or AE ?  ?   ?OT Treatment/Interventions: Self-care/ADL training;Therapeutic exercise;Neuromuscular education;Energy conservation;DME and/or AE instruction;Therapeutic activities;Balance training;Patient/family education  ?  ?OT Goals(Current goals can be found in the care plan section) Acute Rehab OT Goals ?Patient Stated Goal: to go home. ?OT Goal Formulation: With patient ?Time For Goal Achievement: 02/25/22 ?Potential to Achieve Goals: Fair  ?OT Frequency: Min 2X/week ?  ? ?Co-evaluation   ?  ?  ?  ?  ? ?  ?AM-PAC OT "6 Clicks" Daily Activity     ?Outcome Measure Help from another person eating meals?: None ?Help from another person taking care of personal grooming?: A Little ?Help from another person toileting, which includes using toliet, bedpan, or urinal?: A Little ?Help from another person bathing (including washing, rinsing, drying)?: A Little ?Help from another person to put on and taking off regular upper body clothing?: A Little ?Help from another person to put on and taking off regular lower body clothing?: A Little ?6 Click Score: 19 ?  ?End of Session Equipment Utilized During Treatment: Rolling walker (2 wheels) ?Nurse Communication: Mobility status ? ?Activity Tolerance: Patient tolerated treatment well ?Patient left: with call bell/phone within reach;in chair;with chair alarm set ? ?OT Visit Diagnosis: Unsteadiness on feet (R26.81);Other abnormalities of gait and mobility (R26.89)  ?              ?Time: 8032-1224 ?  OT Time Calculation (min): 23 min ?Charges:  OT General Charges ?$OT Visit: 1 Visit ?OT Evaluation ?$OT Eval Low Complexity: 1 Low ?OT Treatments ?$Self Care/Home Management : 8-22 mins ? ?Karleen Seebeck OTR/L, MS ?Acute Rehabilitation Department ?Office# [phone] ?Pager# [phone] ? ? ?[place] ?02/11/2022, 4:09 PM ?

## 2022-02-11 NOTE — Progress Notes (Signed)
Orthopedic Tech Progress Note ?Patient Details:  ?[name] ?1927/08/21 ?[id number] ? ?Ortho Devices ?Type of Ortho Device: Postop shoe/boot ?Ortho Device/Splint Location: Left foot ?Ortho Device/Splint Interventions: Application ?  ?Post Interventions ?Patient Tolerated: Well ?Instructions Provided: Care of device ? ?[name] ?02/11/2022, 2:50 PM ? ?

## 2022-03-01 ENCOUNTER — Ambulatory Visit: Payer: Medicare Other | Admitting: Orthopedic Surgery

## 2022-03-19 ENCOUNTER — Other Ambulatory Visit: Payer: Self-pay | Admitting: *Deleted

## 2022-03-19 DIAGNOSIS — I8393 Asymptomatic varicose veins of bilateral lower extremities: Secondary | ICD-10-CM

## 2022-04-01 NOTE — Progress Notes (Unsigned)
[facility] [place]  ASSESSMENT / PLAN: [name] is a 88 y.o. female with chronic venous insufficiency of bilateral lower extremities causing spontaneous bleeding of reticular veins requiring ER evaluation and admission to the hospital.  Venous duplex is significant for greater saphenous vein reflux. Recommend compression and elevation for symptomatic relief. I will have her follow up with one of my partners who performs venous intervention in the next 1-2 weeks to discuss options for reducing bleeding risk.  CHIEF COMPLAINT: bleeding from varicosities  HISTORY OF PRESENT ILLNESS: [name] is a 88 y.o. female recently admitted to the hospital in late April and early May for spontaneous bleeding from right lower extremity lateral reticular veins.  The patient reports she was taking a bath when one of the veins in her leg spontaneously started bleeding.  Significant bleeding was noted.  The patient lost consciousness and was found by her daughter.  She fell suffering a fracture of her left fifth metatarsal.  She was observed in the hospital and discharged after confirming stable vital signs and hemoglobin.  The patient has never had problems from her varicose veins before.  She is not particularly bothered by swelling.  Never had venous ulcer before.  Past Medical History:  Diagnosis Date   Arthritis    GERD (gastroesophageal reflux disease)    Hyperlipidemia    Polymyalgia rheumatica (HCC)    Thyroid nodule     Past Surgical History:  Procedure Laterality Date   ABDOMINAL HYSTERECTOMY  1978   APPENDECTOMY  1948   BACK SURGERY     x2   CATARACT EXTRACTION, BILATERAL  03-12-12   bilateral   CHOLECYSTECTOMY  1966   THYROID LOBECTOMY  03/17/2012   Procedure: THYROID LOBECTOMY;  Surgeon: Edward Jolly, MD;  Location: [facility];  Service: General;  Laterality: Right;   THYROIDECTOMY  03/17/2012   Procedure: THYROIDECTOMY;  Surgeon: Edward Jolly,  MD;  Location: [facility];  Service: General;  Laterality: N/A;    Family History  Problem Relation Age of Onset   Heart disease Mother        heart attack   Stroke Father    Cancer Sister        Thyroid    Social History   Socioeconomic History   Marital status: Married    Spouse name: Not on file   Number of children: Not on file   Years of education: Not on file   Highest education level: Not on file  Occupational History   Not on file  Tobacco Use   Smoking status: Never   Smokeless tobacco: Not on file  Substance and Sexual Activity   Alcohol use: No   Drug use: No   Sexual activity: Not on file  Other Topics Concern   Not on file  Social History Narrative   Not on file   Social Determinants of Health   Financial Resource Strain: Not on file  Food Insecurity: Not on file  Transportation Needs: Not on file  Physical Activity: Not on file  Stress: Not on file  Social Connections: Not on file  Intimate Partner Violence: Not on file    Allergies  Allergen Reactions   Levofloxacin     REACTION: nausea   Statins     unknown    Current Outpatient Medications  Medication Sig Dispense Refill   aspirin 81 MG tablet Take 81 mg by mouth daily with breakfast.      atorvastatin (  LIPITOR) 10 MG tablet Take 10 mg by mouth daily.     calcium-vitamin D (OSCAL WITH D) 500-200 MG-UNIT tablet Take 1 tablet by mouth daily with breakfast.     gabapentin (NEURONTIN) 300 MG capsule Take 300 mg by mouth 2 (two) times daily.      levothyroxine (SYNTHROID) 112 MCG tablet Take 112 mcg by mouth daily.     rOPINIRole (REQUIP) 0.5 MG tablet Take 0.5 mg by mouth 2 (two) times daily.     rOPINIRole (REQUIP) 1 MG tablet Take 1 mg by mouth at bedtime.     telmisartan (MICARDIS) 40 MG tablet Take 40 mg by mouth daily.     No current facility-administered medications for this visit.    PHYSICAL EXAM Vitals:   04/02/22 0845  BP: 124/68  Pulse: 67  Resp: 20  Temp: 98.5 F (36.9 C)   SpO2: 97%  Weight: 157 lb (71.2 kg)  Height: '5\' 1"'$  (1.549 m)    Constitutional: elderly woman in no distress Cardiac: regular rate and rhythm.  Respiratory: unlabored. Peripheral vascular: 2+ DP and PT pulses Extremity: scant edema. no cyanosis. no pallor.  Skin:          PERTINENT LABORATORY AND RADIOLOGIC DATA  Most recent CBC    Latest Ref Rng & Units 02/11/2022    3:36 AM 02/10/2022    6:25 PM 02/10/2022    7:28 AM  CBC  WBC 4.0 - 10.5 K/uL 6.6  7.0    Hemoglobin 12.0 - 15.0 g/dL 8.4  8.5  9.4   Hematocrit 36.0 - 46.0 % 26.9  27.1  29.9   Platelets 150 - 400 K/uL 163  163       Most recent CMP    Latest Ref Rng & Units 02/11/2022    3:36 AM 02/09/2022    6:50 PM 06/30/2019    4:30 PM  CMP  Glucose 70 - 99 mg/dL 105  111  102   BUN 8 - 23 mg/dL '21  27  20   '$ Creatinine 0.44 - 1.00 mg/dL 0.85  1.05  0.92   Sodium 135 - 145 mmol/L 141  140  137   Potassium 3.5 - 5.1 mmol/L 4.2  4.4  4.0   Chloride 98 - 111 mmol/L 110  106  103   CO2 22 - 32 mmol/L '28  28  26   '$ Calcium 8.9 - 10.3 mg/dL 8.6  9.2  9.1   Total Protein 6.5 - 8.1 g/dL  6.7    Total Bilirubin 0.3 - 1.2 mg/dL  0.6    Alkaline Phos 38 - 126 U/L  68    AST 15 - 41 U/L  19    ALT 0 - 44 U/L  14      Renal function CrCl cannot be calculated (Patient's most recent lab result is older than the maximum 21 days allowed.).  No results found for: "HGBA1C"  LDL Cholesterol  Date Value Ref Range Status  03/16/2009 (H) 0 - 99 mg/dL Final   166        Total Cholesterol/HDL:CHD Risk Coronary Heart Disease Risk Table                     Men   Women  1/2 Average Risk   3.4   3.3  Average Risk       5.0   4.4  2 X Average Risk   9.6   7.1  3 X Average Risk  23.4   11.0        Use the calculated Patient Ratio above and the CHD Risk Table to determine the patient's CHD Risk.        ATP III CLASSIFICATION (LDL):  <100     mg/dL   Optimal  100-129  mg/dL   Near or Above                    Optimal  130-159   mg/dL   Borderline  160-189  mg/dL   High  >190     mg/dL   Very High    Right venous reflux study:  - No evidence of deep vein thrombosis seen in the right lower extremity,  from the common femoral through the popliteal veins.  - No evidence of superficial venous thrombosis in the right lower  extremity.     - Venous reflux is noted in the right common femoral vein.  - Venous reflux is noted in the right sapheno-femoral junction.  - Venous reflux is noted in the right greater saphenous vein in the thigh.  - Venous reflux is noted in the right short saphenous vein.   - Many varicose branches noted throughout the right lower extremity.  Cluster of varicosities in the proximal to mid calf at the area of  concern.   Yevonne Aline. Stanford Breed, MD [facility] Phone Number: [phone] 04/02/2022 9:13 AM  Total time spent on preparing this encounter including chart review, data review, collecting history, examining the patient, coordinating care for this new patient, 60 minutes.  Portions of this report may have been transcribed using voice recognition software.  Every effort has been made to ensure accuracy; however, inadvertent computerized transcription errors may still be present.

## 2022-04-02 ENCOUNTER — Encounter: Payer: Self-pay | Admitting: Vascular Surgery

## 2022-04-02 ENCOUNTER — Ambulatory Visit (INDEPENDENT_AMBULATORY_CARE_PROVIDER_SITE_OTHER): Payer: Medicare Other | Admitting: Vascular Surgery

## 2022-04-02 ENCOUNTER — Ambulatory Visit (HOSPITAL_COMMUNITY)
Admission: RE | Admit: 2022-04-02 | Discharge: 2022-04-02 | Disposition: A | Discharge status: Home / Self Care | Payer: Medicare Other | Source: Ambulatory Visit | Attending: Vascular Surgery | Admitting: Vascular Surgery

## 2022-04-02 VITALS — BP 124/68 | HR 67 | Temp 98.5°F | Resp 20 | Ht 61.0 in | Wt 157.0 lb

## 2022-04-02 DIAGNOSIS — I872 Venous insufficiency (chronic) (peripheral): Secondary | ICD-10-CM | POA: Diagnosis not present

## 2022-04-02 DIAGNOSIS — I8393 Asymptomatic varicose veins of bilateral lower extremities: Secondary | ICD-10-CM | POA: Insufficient documentation

## 2022-04-22 ENCOUNTER — Ambulatory Visit (INDEPENDENT_AMBULATORY_CARE_PROVIDER_SITE_OTHER): Payer: Medicare Other | Admitting: Surgery

## 2022-04-22 ENCOUNTER — Encounter: Payer: Self-pay | Admitting: Surgery

## 2022-04-22 VITALS — BP 130/78 | HR 65 | Temp 97.7°F | Resp 20 | Ht 61.0 in | Wt 156.0 lb

## 2022-04-22 DIAGNOSIS — I83893 Varicose veins of bilateral lower extremities with other complications: Secondary | ICD-10-CM

## 2022-04-22 DIAGNOSIS — I8393 Asymptomatic varicose veins of bilateral lower extremities: Secondary | ICD-10-CM

## 2022-04-22 NOTE — Progress Notes (Signed)
[facility] [place]  Patient name: [name] MRN: [id number] DOB: [birth year] Sex: female   REASON FOR VISIT:    Follow up  [place] ILLNESS:    [name] is a 88 y.o. female who was initially evaluated by Dr. Stanford Breed on 04/02/2022 for bleeding veins in her right leg.  She has had 2 separate bleeding episodes 1 of which required her to go to the hospital.  She denies any history of DVT.  She has not been compliant wearing compression socks.  She does not report a significant leg swelling.  She does not have any ulcers.   PAST MEDICAL HISTORY:   Past Medical History:  Diagnosis Date   Arthritis    GERD (gastroesophageal reflux disease)    Hyperlipidemia    Polymyalgia rheumatica (HCC)    Thyroid nodule      FAMILY HISTORY:   Family History  Problem Relation Age of Onset   Heart disease Mother        heart attack   Stroke Father    Cancer Sister        Thyroid    SOCIAL HISTORY:   Social History   Tobacco Use   Smoking status: Never   Smokeless tobacco: Not on file  Substance Use Topics   Alcohol use: No     ALLERGIES:   Allergies  Allergen Reactions   Levofloxacin     REACTION: nausea   Statins     unknown     CURRENT MEDICATIONS:   Current Outpatient Medications  Medication Sig Dispense Refill   aspirin 81 MG tablet Take 81 mg by mouth daily with breakfast.      atorvastatin (LIPITOR) 10 MG tablet Take 10 mg by mouth daily.     calcium-vitamin D (OSCAL WITH D) 500-200 MG-UNIT tablet Take 1 tablet by mouth daily with breakfast.     gabapentin (NEURONTIN) 300 MG capsule Take 300 mg by mouth 2 (two) times daily.      levothyroxine (SYNTHROID) 112 MCG tablet Take 112 mcg by mouth daily.     rOPINIRole (REQUIP) 0.5 MG tablet Take 0.5 mg by mouth 2 (two) times daily.     rOPINIRole (REQUIP) 1 MG tablet Take 1 mg by mouth at bedtime.     telmisartan (MICARDIS) 40 MG tablet  Take 40 mg by mouth daily.     No current facility-administered medications for this visit.    REVIEW OF SYSTEMS:   '[X]'$  denotes positive finding, '[ ]'$  denotes negative finding Cardiac  Comments:  Chest pain or chest pressure:    Shortness of breath upon exertion:    Short of breath when lying flat:    Irregular heart rhythm:        Vascular    Pain in calf, thigh, or hip brought on by ambulation:    Pain in feet at night that wakes you up from your sleep:     Blood clot in your veins:    Leg swelling:         Pulmonary    Oxygen at home:    Productive cough:     Wheezing:         Neurologic    Sudden weakness in arms or legs:     Sudden numbness in arms or legs:     Sudden onset of difficulty speaking or slurred speech:    Temporary loss of vision in one eye:     Problems with  dizziness:         Gastrointestinal    Blood in stool:     Vomited blood:         Genitourinary    Burning when urinating:     Blood in urine:        Psychiatric    Major depression:         Hematologic    Bleeding problems:    Problems with blood clotting too easily:        Skin    Rashes or ulcers:        Constitutional    Fever or chills:      PHYSICAL EXAM:   Vitals:   04/22/22 1127  BP: 130/78  Pulse: 65  Resp: 20  Temp: 97.7 F (36.5 C)  Weight: 156 lb (70.8 kg)  Height: '5\' 1"'$  (1.549 m)    GENERAL: The patient is a well-nourished female, in no acute distress. The vital signs are documented above. CARDIAC: There is a regular rate and rhythm.  VASCULAR: I used the sono site to evaluate the saphenous system in the right leg.  These were of normal caliber including the great saphenous and small saphenous.  I did not see an anterior accessory. PULMONARY: Non-labored respirations ABDOMEN: Soft and non-tender with normal pitched bowel sounds.  MUSCULOSKELETAL: There are no major deformities or cyanosis. NEUROLOGIC: No focal weakness or paresthesias are detected. SKIN: See  photos from Dr. Mora Appl note PSYCHIATRIC: The patient has a normal affect.  STUDIES:   I have reviewed the following reflux study: +--------------+---------+------+-----------+------------+-----------------  --+  RIGHT         Reflux NoRefluxReflux TimeDiameter cmsComments                                      Yes                                               +--------------+---------+------+-----------+------------+-----------------  --+  CFV                     yes   >1 second                                   +--------------+---------+------+-----------+------------+-----------------  --+  FV mid        no                                                          +--------------+---------+------+-----------+------------+-----------------  --+  Popliteal     no                                                          +--------------+---------+------+-----------+------------+-----------------  --+  GSV at SFJ              yes    >500 ms  0.45                          +--------------+---------+------+-----------+------------+-----------------  --+  GSV prox thigh          yes    >500 ms      0.30                          +--------------+---------+------+-----------+------------+-----------------  --+  GSV mid thigh           yes    >500 ms      0.32    moves out of  fascia  +--------------+---------+------+-----------+------------+-----------------  --+  GSV dist thighno                            0.35    out of fascia          +--------------+---------+------+-----------+------------+-----------------  --+  GSV at knee   no                            0.26    out of fascia          +--------------+---------+------+-----------+------------+-----------------  --+  GSV prox calf           yes    >500 ms      0.28    out of fascia           +--------------+---------+------+-----------+------------+-----------------  --+  GSV mid calf                                0.31    returns to  fascia    +--------------+---------+------+-----------+------------+-----------------  --+  SSV Pop Fossa no                            0.11                          +--------------+---------+------+-----------+------------+-----------------  --+  SSV prox calf no                            0.15                          +--------------+---------+------+-----------+------------+-----------------  --+  SSV mid calf            yes    >500 ms      0.14                          +--------------+---------+------+-----------+------------+-----------------  MEDICAL ISSUES:   Bleeding varicosities in the right leg: By formal reflux testing and by my bedside evaluation with the SonoSite, I do not see any significant refluxing veins in the superficial system.  The patient has multiple reticular and spider veins as well as varicosities.  I have discussed with the patient that it is very important for her to wear her compression socks.  I would consider sclerotherapy to try and clear up as many of the reticular veins as possible.  I potentially could perform stab phlebectomies as well but I would try to keep this as minimally invasive as possible.  I  am going to have [place] reach out to the patient to consider her for sclera.  She was also given compression stockings today.    Leia Alf, MD, FACS [facility] [phone] Pager [phone]

## 2022-05-03 ENCOUNTER — Telehealth: Payer: Self-pay

## 2022-05-03 NOTE — Telephone Encounter (Signed)
Called pt to discuss sclerotherapy. Left voicemail to call back at her convenience.

## 2022-05-22 ENCOUNTER — Telehealth: Payer: Self-pay

## 2022-05-22 NOTE — Telephone Encounter (Signed)
Pt's insurance does not require prior authorization for sclerotherapy injections. Pt was given this information and will call back is she wishes to proceed in future.

## 2023-07-02 ENCOUNTER — Ambulatory Visit (INDEPENDENT_AMBULATORY_CARE_PROVIDER_SITE_OTHER): Payer: Medicare Other | Admitting: Physician Assistant

## 2023-07-02 ENCOUNTER — Ambulatory Visit: Payer: Medicare Other

## 2023-07-02 ENCOUNTER — Encounter: Payer: Self-pay | Admitting: Physician Assistant

## 2023-07-02 VITALS — BP 150/84 | HR 74 | Resp 20 | Ht 61.0 in | Wt 156.0 lb

## 2023-07-02 DIAGNOSIS — R413 Other amnesia: Secondary | ICD-10-CM | POA: Diagnosis not present

## 2023-07-02 MED ORDER — DONEPEZIL HCL 5 MG PO TABS: 5.0000 mg | ORAL_TABLET | Freq: Every day | ORAL | 11 refills | Status: DC

## 2023-07-02 NOTE — Progress Notes (Addendum)
Assessment/Plan:     [name] is a very pleasant 89 y.o. year old RH female with a history of hypertension, hypothyroidism, RLS seen today for evaluation of memory loss. MoCA today is 12/30  .  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  Patient lives independently, and is able to participate on her ADLs.  Patient no longer drives.  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  Check B12  Start donepezil 5 mg daily, side effects discussed.   Recommend good control of cardiovascular risk factors.   Continue to control mood as per PCP Fol[place]ow up in 3 months  Subjective:    The patient is accompanied by her daughter who supplements the history  How long did patient have memory difficulties?  For the last 6 months.  Patient has difficulty remembering new information, recent conversations and names of people.  Sometimes she confuses family members. repeats oneself?  Endorsed Disoriented when walking into a room?  Denies  Leaving objects in unusual places?  May lose her bag but not in unusual places.  Wandering behavior? Denies.   Any personality changes, or depression, anxiety?  Has moments of irritability. "She may get panicky for ex: if the cleaning lady does not come on a certain day"-daughter says Hallucinations or paranoia?  She reports that has seen her husband. She hides stuff in case someone comes and takes her stuff. Seizures? Denies.    Any sleep changes? Does not sleep well, She had vivid dreams in the past, especially abut her dead husband, she has RLS and takes meds which help. Denies sleepwalking.    Sleep apnea? Denies.   Any hygiene concerns?  Denies.   Independent of bathing and dressing?  Needs assistance getting dressed.  Who is in charge of the medications?  Patient is in charge, denies missing any doses  Who is in charge of the finances?   is in charge     Any changes in appetite?   Denies.  She drinks plenty water.    Patient have trouble swallowing?  Denies.   Does the patient cook?  No   Any headaches?  Denies.   Chronic back pain?  Denies.   Ambulates with difficulty? Denies. Recent falls or head injuries? Not recently, she fell 2 times over the last 2 years. She hit her head with the bathtub but no LOC Vision changes? Denies. Stroke like symptoms?  Denies.   Any tremors?  Denies.   Any anosmia? For several years  Any incontinence of urine?uses pads just in case   Any bowel dysfunction? She has some constipation     Patient lives alone, her daughter monitors  History of heavy alcohol intake? Denies.   History of heavy tobacco use? Denies.   Family history of dementia?   Denies. Does patient drive?Yes, denies getting lost, she drives very short distances  MRI of the brain performed at [facility] on 06/26/2023 remarkable for remote lacunar infarcts within the left thalamus and bilateral basal ganglia, sequela of moderate chronic small vessel disease, parenchymal brain volume loss with slight disproportionate involvement of the parietal lobes.  Allergies  Allergen Reactions   Levofloxacin     REACTION: nausea  Statins     unknown    Current Outpatient Medications  Medication Instructions   aspirin 81 mg, Daily with breakfast   atorvastatin (LIPITOR) 10 mg, Oral, Daily   calcium-vitamin D (OSCAL WITH D) 500-200 MG-UNIT tablet 1 tablet, Daily with breakfast   donepezil (ARICEPT) 5 mg, Oral, Daily   gabapentin (NEURONTIN) 300 mg, Oral, 2 times daily   levothyroxine (SYNTHROID) 112 mcg, Oral, Daily   rOPINIRole (REQUIP) 1 mg, Oral, Daily at bedtime   rOPINIRole  (REQUIP) 0.5 mg, Oral, 2 times daily   telmisartan (MICARDIS) 40 mg, Oral, Daily     VITALS:   Vitals:   07/02/23 1013 07/02/23 1058  BP: (!) 170/85 (!) 150/84  Pulse: 74   Resp: 20   SpO2: 98%   Weight: 156 lb (70.8 kg)   Height: 5\' 1"  (1.549 m)        No data to display          PHYSICAL EXAM   HEENT:  Normocephalic, atraumatic. The mucous membranes are moist. The superficial temporal arteries are without ropiness or tenderness. Cardiovascular: Regular rate and rhythm. Lungs: Clear to auscultation bilaterally. Neck: There are no carotid bruits noted bilaterally.  NEUROLOGICAL:    07/02/2023   12:00 PM  Montreal Cognitive Assessment   Visuospatial/ Executive (0/5) 0  Naming (0/3) 2  Attention: Read list of digits (0/2) 2  Attention: Read list of letters (0/1) 1  Attention: Serial 7 subtraction starting at 100 (0/3) 0  Language: Repeat phrase (0/2) 0  Language : Fluency (0/1) 0  Abstraction (0/2) 0  Delayed Recall (0/5) 0  Orientation (0/6) 6  Total 11  Adjusted Score (based on education) 12        No data to display           Orientation:  Alert and oriented to person, not to place and time. No aphasia or dysarthria. Fund of knowledge is reduced. Recent and remote memory impaired.  Attention and concentration are reduced.  Able to name objects and unable to repeat phrases. Delayed recall  0/5 Cranial nerves: There is good facial symmetry. Extraocular muscles are intact and visual fields are full to confrontational testing. Speech is fluent and clear, no tongue deviation. Hearing is intact to conversational tone.  Tone: Tone is good throughout. Sensation: Sensation is intact to light touch and pinprick throughout. Vibration is intact at the bilateral big toe. Coordination: The patient has no difficulty with RAM's or FNF bilaterally. Normal finger to nose  Motor: Strength is 5/5 in the bilateral upper and lower extremities. There is no pronator drift. There  are no fasciculations noted. DTR's: Deep tendon reflexes are 2/4 .  Plantar responses are downgoing bilaterally. Gait and Station: The patient is able to ambulate without difficulty.Gait is cautious and narrow.      Thank you for allowing [place] the opportunity to participate in the care of this nice patient. Please do not hesitate to contact us for any questions or concerns.   Total time spent on today's visit was 45 minutes dedicated to this patient today, preparing to see patient, examining the patient, ordering tests and/or medications and counseling the patient, documenting clinical information in the EHR or other health record, independently interpreting results and communicating results to the patient/family, discussing treatment and goals, answering patient's questions and coordinating care.  Cc:  Medicine, [facility] 07/02/2023 12:44 PM

## 2023-07-02 NOTE — Patient Instructions (Addendum)
It was a pleasure to see you today at our office.   Recommendations:  Follow up in 3 months Start donepezil 5 mg daily   For psychiatric meds, mood meds: Please have your primary care physician manage these medications.  If you have any severe symptoms of a stroke, or other severe issues such as confusion,severe chills or fever, etc call 911 or go to the ER as you may need to be evaluated further  For guidance regarding [facility] and if placement were needed at the facility, contact Social Worker tel: [phone]  For assessment of decision of mental capacity and competency:  Call Dr. Erick Blinks, geriatric psychiatrist at [phone]  Counseling regarding caregiver distress, including caregiver depression, anxiety and issues regarding community resources, adult day care programs, adult living facilities, or memory care questions:  please contact your  Primary Doctor's Social Worker   Whom to call: Memory  decline, memory medications: Call our office [phone]    [email or web address]   RECOMMENDATIONS FOR ALL PATIENTS WITH MEMORY PROBLEMS: 1. Continue to exercise (Recommend 30 minutes of walking everyday, or 3 hours every week) 2. Increase social interactions - continue going to [place] and enjoy social gatherings with friends and family 3. Eat healthy, avoid fried foods and eat more fruits and vegetables 4. Maintain adequate blood pressure, blood sugar, and blood cholesterol level. Reducing the risk of stroke and cardiovascular disease also helps promoting better memory. 5. Avoid stressful situations. Live a simple life and avoid aggravations. Organize your time and prepare for the next day in anticipation. 6. Sleep well, avoid any interruptions of sleep and avoid any distractions in the bedroom that may interfere with adequate sleep quality 7. Avoid sugar, avoid sweets as there is a strong link between  excessive sugar intake, diabetes, and cognitive impairment We discussed the Mediterranean diet, which has been shown to help patients reduce the risk of progressive memory disorders and reduces cardiovascular risk. This includes eating fish, eat fruits and green leafy vegetables, nuts like almonds and hazelnuts, walnuts, and also use olive oil. Avoid fast foods and fried foods as much as possible. Avoid sweets and sugar as sugar use has been linked to worsening of memory function.  There is always a concern of gradual progression of memory problems. If this is the case, then we may need to adjust level of care according to patient needs. Support, both to the patient and caregiver, should then be put into place.      You have been referred for a neuropsychological evaluation (i.e., evaluation of memory and thinking abilities). Please bring someone with you to this appointment if possible, as it is helpful for the doctor to hear from both you and another adult who knows you well. Please bring eyeglasses and hearing aids if you wear them.    The evaluation will take approximately 3 hours and has two parts:   The first part is a clinical interview with the neuropsychologist (Dr. Milbert Coulter or Dr. Roseanne Reno). During the interview, the neuropsychologist will speak with you and the individual you brought to the appointment.    The second part of the evaluation is testing with the doctor's technician Annabelle Harman or Selena Batten). During the testing, the technician will ask you to remember different types of material, solve problems, and answer some questionnaires. Your family member will not be present for this portion of the evaluation.   Please note: We must reserve several hours of the neuropsychologist's time and the psychometrician's time for your  evaluation appointment. As such, there is a No-Show fee of $100. If you are unable to attend any of your appointments, please contact our office as soon as possible to reschedule.       DRIVING: Regarding driving, in patients with progressive memory problems, driving will be impaired. We advise to have someone else do the driving if trouble finding directions or if minor accidents are reported. Independent driving assessment is available to determine safety of driving.   If you are interested in the driving assessment, you can contact the following:  [employer] in [place] [phone]  [facility] [phone]  [facility] [phone]  [facility] [phone] or [phone]   FALL PRECAUTIONS: Be cautious when walking. Scan the area for obstacles that may increase the risk of trips and falls. When getting up in the mornings, sit up at the edge of the bed for a few minutes before getting out of bed. Consider elevating the bed at the head end to avoid drop of blood pressure when getting up. Walk always in a well-lit room (use night lights in the walls). Avoid area rugs or power cords from appliances in the middle of the walkways. Use a walker or a cane if necessary and consider physical therapy for balance exercise. Get your eyesight checked regularly.  FINANCIAL OVERSIGHT: Supervision, especially oversight when making financial decisions or transactions is also recommended.  HOME SAFETY: Consider the safety of the kitchen when operating appliances like stoves, microwave oven, and blender. Consider having supervision and share cooking responsibilities until no longer able to participate in those. Accidents with firearms and other hazards in the house should be identified and addressed as well.   ABILITY TO BE LEFT ALONE: If patient is unable to contact 911 operator, consider using LifeLine, or when the need is there, arrange for someone to stay with patients. Smoking is a fire hazard, consider supervision or cessation. Risk of wandering should be assessed by caregiver and if detected at any point, supervision and  safe proof recommendations should be instituted.  MEDICATION SUPERVISION: Inability to self-administer medication needs to be constantly addressed. Implement a mechanism to ensure safe administration of the medications.      Mediterranean Diet A Mediterranean diet refers to food and lifestyle choices that are based on the traditions of countries located on the [employer] [place]. This way of eating has been shown to help prevent certain conditions and improve outcomes for people who have chronic diseases, like kidney disease and heart disease. What are tips for following this plan? Lifestyle  Cook and eat meals together with your family, when possible. Drink enough fluid to keep your urine clear or pale yellow. Be physically active every day. This includes: Aerobic exercise like running or swimming. Leisure activities like gardening, walking, or housework. Get 7-8 hours of sleep each night. If recommended by your health care provider, drink red wine in moderation. This means 1 glass a day for nonpregnant women and 2 glasses a day for men. A glass of wine equals 5 oz (150 mL). Reading food labels  Check the serving size of packaged foods. For foods such as rice and pasta, the serving size refers to the amount of cooked product, not dry. Check the total fat in packaged foods. Avoid foods that have saturated fat or trans fats. Check the ingredients list for added sugars, such as corn syrup. Shopping  At the grocery store, buy most of your food from the areas near the walls of  the store. This includes: Fresh fruits and vegetables (produce). Grains, beans, nuts, and seeds. Some of these may be available in unpackaged forms or large amounts (in bulk). Fresh seafood. Poultry and eggs. Low-fat dairy products. Buy whole ingredients instead of prepackaged foods. Buy fresh fruits and vegetables in-season from local farmers markets. Buy frozen fruits and vegetables in resealable bags. If you do  not have access to quality fresh seafood, buy precooked frozen shrimp or canned fish, such as tuna, salmon, or sardines. Buy small amounts of raw or cooked vegetables, salads, or olives from the deli or salad bar at your store. Stock your pantry so you always have certain foods on hand, such as olive oil, canned tuna, canned tomatoes, rice, pasta, and beans. Cooking  Cook foods with extra-virgin olive oil instead of using butter or other vegetable oils. Have meat as a side dish, and have vegetables or grains as your main dish. This means having meat in small portions or adding small amounts of meat to foods like pasta or stew. Use beans or vegetables instead of meat in common dishes like chili or lasagna. Experiment with different cooking methods. Try roasting or broiling vegetables instead of steaming or sauteing them. Add frozen vegetables to soups, stews, pasta, or rice. Add nuts or seeds for added healthy fat at each meal. You can add these to yogurt, salads, or vegetable dishes. Marinate fish or vegetables using olive oil, lemon juice, garlic, and fresh herbs. Meal planning  Plan to eat 1 vegetarian meal one day each week. Try to work up to 2 vegetarian meals, if possible. Eat seafood 2 or more times a week. Have healthy snacks readily available, such as: Vegetable sticks with hummus. Greek yogurt. Fruit and nut trail mix. Eat balanced meals throughout the week. This includes: Fruit: 2-3 servings a day Vegetables: 4-5 servings a day Low-fat dairy: 2 servings a day Fish, poultry, or lean meat: 1 serving a day Beans and legumes: 2 or more servings a week Nuts and seeds: 1-2 servings a day Whole grains: 6-8 servings a day Extra-virgin olive oil: 3-4 servings a day Limit red meat and sweets to only a few servings a month What are my food choices? Mediterranean diet Recommended Grains: Whole-grain pasta. Brown rice. Bulgar wheat. Polenta. Couscous. Whole-wheat bread. Orpah Cobb. Vegetables: Artichokes. Beets. Broccoli. Cabbage. Carrots. Eggplant. Green beans. Chard. Kale. Spinach. Onions. Leeks. Peas. Squash. Tomatoes. Peppers. Radishes. Fruits: Apples. Apricots. Avocado. Berries. Bananas. Cherries. Dates. Figs. Grapes. Lemons. Melon. Oranges. Peaches. Plums. Pomegranate. Meats and other protein foods: Beans. Almonds. Sunflower seeds. Pine nuts. Peanuts. Cod. Salmon. Scallops. Shrimp. Tuna. Tilapia. Clams. Oysters. Eggs. Dairy: Low-fat milk. Cheese. Greek yogurt. Beverages: Water. Red wine. Herbal tea. Fats and oils: Extra virgin olive oil. Avocado oil. Grape seed oil. Sweets and desserts: [place] yogurt with honey. Baked apples. Poached pears. Trail mix. Seasoning and other foods: Basil. Cilantro. Coriander. Cumin. Mint. Parsley. Sage. Rosemary. Tarragon. Garlic. Oregano. Thyme. Pepper. Balsalmic vinegar. Tahini. Hummus. Tomato sauce. Olives. Mushrooms. Limit these Grains: Prepackaged pasta or rice dishes. Prepackaged cereal with added sugar. Vegetables: Deep fried potatoes (french fries). Fruits: Fruit canned in syrup. Meats and other protein foods: Beef. Pork. Lamb. Poultry with skin. Hot dogs. Tomasa Blase. Dairy: Ice cream. Sour cream. Whole milk. Beverages: Juice. Sugar-sweetened soft drinks. Beer. Liquor and spirits. Fats and oils: Butter. Canola oil. Vegetable oil. Beef fat (tallow). Lard. Sweets and desserts: Cookies. Cakes. Pies. Candy. Seasoning and other foods: Mayonnaise. Premade sauces and marinades. The items listed may  not be a complete list. Talk with your dietitian about what dietary choices are right for you. Summary The Mediterranean diet includes both food and lifestyle choices. Eat a variety of fresh fruits and vegetables, beans, nuts, seeds, and whole grains. Limit the amount of red meat and sweets that you eat. Talk with your health care provider about whether it is safe for you to drink red wine in moderation. This means 1 glass a day for  nonpregnant women and 2 glasses a day for men. A glass of wine equals 5 oz (150 mL). This information is not intended to replace advice given to you by your health care provider. Make sure you discuss any questions you have with your health care provider. Document Released: 05/23/2016 Document Revised: 06/25/2016 Document Reviewed: 05/23/2016 Elsevier Interactive Patient Education  2017 [employer].

## 2023-08-27 ENCOUNTER — Telehealth: Payer: Self-pay | Admitting: Physician Assistant

## 2023-08-27 ENCOUNTER — Other Ambulatory Visit: Payer: Self-pay | Admitting: Physician Assistant

## 2023-08-27 MED ORDER — DONEPEZIL HCL 10 MG PO TABS: ORAL_TABLET | ORAL | 11 refills | Status: DC

## 2023-08-27 NOTE — Telephone Encounter (Signed)
Caller stated pt is not doing well. Pt continues to have a lot of anxiety and her memory is getting worse. Caller states she is open to any recommendations of possible medication change/ antidepressant.

## 2023-09-09 ENCOUNTER — Emergency Department (HOSPITAL_COMMUNITY)
Admission: EM | Admit: 2023-09-09 | Discharge: 2023-09-09 | Disposition: A | Discharge status: Home / Self Care | Payer: Medicare Other | Attending: Emergency Medicine | Admitting: Emergency Medicine

## 2023-09-09 ENCOUNTER — Encounter (HOSPITAL_COMMUNITY): Payer: Self-pay

## 2023-09-09 ENCOUNTER — Other Ambulatory Visit: Payer: Self-pay

## 2023-09-09 DIAGNOSIS — Z7982 Long term (current) use of aspirin: Secondary | ICD-10-CM | POA: Diagnosis not present

## 2023-09-09 DIAGNOSIS — R195 Other fecal abnormalities: Secondary | ICD-10-CM | POA: Diagnosis present

## 2023-09-09 DIAGNOSIS — R197 Diarrhea, unspecified: Secondary | ICD-10-CM

## 2023-09-09 LAB — COMPREHENSIVE METABOLIC PANEL
ALT: 17 U/L (ref 0–44)
AST: 24 U/L (ref 15–41)
Albumin: 4.4 g/dL (ref 3.5–5.0)
Alkaline Phosphatase: 75 U/L (ref 38–126)
Anion gap: 14 (ref 5–15)
BUN: 28 mg/dL — ABNORMAL HIGH (ref 8–23)
CO2: 27 mmol/L (ref 22–32)
Calcium: 9.8 mg/dL (ref 8.9–10.3)
Chloride: 101 mmol/L (ref 98–111)
Creatinine, Ser: 0.78 mg/dL (ref 0.44–1.00)
GFR, Estimated: >60 mL/min (ref >60)
Glucose, Bld: 110 mg/dL — ABNORMAL HIGH (ref 70–99)
Potassium: 3.7 mmol/L (ref 3.5–5.1)
Sodium: 142 mmol/L (ref 135–145)
Total Bilirubin: 0.7 mg/dL (ref <1.2)
Total Protein: 7.6 g/dL (ref 6.5–8.1)

## 2023-09-09 LAB — TYPE AND SCREEN
ABO/RH(D): A POS
Antibody Screen: NEGATIVE

## 2023-09-09 LAB — CBC WITH DIFFERENTIAL/PLATELET
Abs Immature Granulocytes: 0.01 10*3/uL (ref 0.00–0.07)
Basophils Absolute: 0.1 10*3/uL (ref 0.0–0.1)
Basophils Relative: 1 %
Eosinophils Absolute: 0.1 10*3/uL (ref 0.0–0.5)
Eosinophils Relative: 2 %
HCT: 35.7 % — ABNORMAL LOW (ref 36.0–46.0)
Hemoglobin: 11.2 g/dL — ABNORMAL LOW (ref 12.0–15.0)
Immature Granulocytes: 0 %
Lymphocytes Relative: 23 %
Lymphs Abs: 1.4 10*3/uL (ref 0.7–4.0)
MCH: 30.3 pg (ref 26.0–34.0)
MCHC: 31.4 g/dL (ref 30.0–36.0)
MCV: 96.5 fL (ref 80.0–100.0)
Monocytes Absolute: 0.8 10*3/uL (ref 0.1–1.0)
Monocytes Relative: 14 %
Neutro Abs: 3.7 10*3/uL (ref 1.7–7.7)
Neutrophils Relative %: 60 %
Platelets: 221 10*3/uL (ref 150–400)
RBC: 3.7 MIL/uL — ABNORMAL LOW (ref 3.87–5.11)
RDW: 15 % (ref 11.5–15.5)
WBC: 6 10*3/uL (ref 4.0–10.5)
nRBC: 0 % (ref 0.0–0.2)

## 2023-09-09 LAB — POC OCCULT BLOOD, ED: Fecal Occult Bld: NEGATIVE

## 2023-09-09 NOTE — ED Provider Notes (Signed)
[place] EMERGENCY DEPARTMENT AT [facility] Provider Note   CSN: [id number] Arrival date & time: 09/09/23  1122     History  Chief Complaint  Patient presents with   Rectal Bleeding    [name] is a 89 y.o. female.  89 year old patient presents with dark stools x 2 days.  Denies any prior history of GI bleeding.  Does not take iron therapy, Pepto-Bismol.  Denies any hematemesis.  No abdominal pain or fever or chills.  No weakness.  States her stools have been loose.  No recent medication changes.  No treatment use for this prior to arrival       Home Medications Prior to Admission medications   Medication Sig Start Date End Date Taking? Authorizing Provider  aspirin 81 MG tablet Take 81 mg by mouth daily with breakfast.     [provider]  atorvastatin (LIPITOR) 10 MG tablet Take 10 mg by mouth daily.    [provider]  calcium-vitamin D (OSCAL WITH D) 500-200 MG-UNIT tablet Take 1 tablet by mouth daily with breakfast.    [provider]  donepezil (ARICEPT) 10 MG tablet Take one tablet daily 08/27/23   Gwynneth Munson, Sung Amabile, PA-C  gabapentin (NEURONTIN) 300 MG capsule Take 300 mg by mouth 2 (two) times daily.  06/10/11   [provider]  levothyroxine (SYNTHROID) 112 MCG tablet Take 112 mcg by mouth daily. 12/19/21   [provider]  rOPINIRole (REQUIP) 0.5 MG tablet Take 0.5 mg by mouth 2 (two) times daily. 01/02/22   [provider]  rOPINIRole (REQUIP) 1 MG tablet Take 1 mg by mouth at bedtime. 05/17/11   [provider]  telmisartan (MICARDIS) 40 MG tablet Take 40 mg by mouth daily.    [provider]      Allergies    Levofloxacin and Statins    Review of Systems   Review of Systems  All other systems reviewed and are negative.   Physical Exam Updated Vital Signs BP (!) 161/72   Pulse 67   Temp 97.9 F (36.6 C) (Oral)   Resp 18   Ht 1.549 m (5\' 1" )   Wt 70.8 kg   SpO2 98%    BMI 29.49 kg/m  Physical Exam Vitals and nursing note reviewed. Exam conducted with a chaperone present.  Constitutional:      General: She is not in acute distress.    Appearance: Normal appearance. She is well-developed. She is not toxic-appearing.  HENT:     Head: Normocephalic and atraumatic.  Eyes:     General: Lids are normal.     Conjunctiva/sclera: Conjunctivae normal.     Pupils: Pupils are equal, round, and reactive to light.  Neck:     Thyroid: No thyroid mass.     Trachea: No tracheal deviation.  Cardiovascular:     Rate and Rhythm: Normal rate and regular rhythm.     Heart sounds: Normal heart sounds. No murmur heard.    No gallop.  Pulmonary:     Effort: Pulmonary effort is normal. No respiratory distress.     Breath sounds: Normal breath sounds. No stridor. No decreased breath sounds, wheezing, rhonchi or rales.  Abdominal:     General: There is no distension.     Palpations: Abdomen is soft.     Tenderness: There is no abdominal tenderness. There is no rebound.  Genitourinary:    Rectum: No external hemorrhoid.     Comments: Green stool noted  on digital rectal exam Musculoskeletal:        General: No tenderness. Normal range of motion.     Cervical back: Normal range of motion and neck supple.  Skin:    General: Skin is warm and dry.     Findings: No abrasion or rash.  Neurological:     Mental Status: She is alert and oriented to person, place, and time. Mental status is at baseline.     GCS: GCS eye subscore is 4. GCS verbal subscore is 5. GCS motor subscore is 6.     Cranial Nerves: Cranial nerves are intact. No cranial nerve deficit.     Sensory: No sensory deficit.     Motor: Motor function is intact.  Psychiatric:        Attention and Perception: Attention normal.        Speech: Speech normal.        Behavior: Behavior normal.     ED Results / Procedures / Treatments   Labs (all labs ordered are listed, but only abnormal results are  displayed) Labs Reviewed  CBC WITH DIFFERENTIAL/PLATELET  COMPREHENSIVE METABOLIC PANEL  POC OCCULT BLOOD, ED  TYPE AND SCREEN    EKG None  Radiology No results found.  Procedures Procedures    Medications Ordered in ED Medications - No data to display  ED Course/ Medical Decision Making/ A&P                                 Medical Decision Making Amount and/or Complexity of Data Reviewed Labs: ordered.   Patient is Hemoccult test negative for blood.  Hemoglobin stable here.  Labs also reassuring.  Will discharge home.        Final Clinical Impression(s) / ED Diagnoses Final diagnoses:  None    Rx / DC Orders ED Discharge Orders     None         Lorre Nick, MD 09/09/23 1406

## 2023-09-09 NOTE — ED Triage Notes (Signed)
Patient brought in by EMS due to dark tarry liquid stools X 2 days. Pt lives at home with family. Per report, patient has history of dementia and family stated she is at her baseline mentation status.

## 2023-10-02 ENCOUNTER — Encounter: Payer: Self-pay | Admitting: Physician Assistant

## 2023-10-02 ENCOUNTER — Ambulatory Visit (INDEPENDENT_AMBULATORY_CARE_PROVIDER_SITE_OTHER): Payer: Medicare Other | Admitting: Physician Assistant

## 2023-10-02 VITALS — BP 161/78 | HR 65 | Ht 61.0 in | Wt 147.0 lb

## 2023-10-02 DIAGNOSIS — R413 Other amnesia: Secondary | ICD-10-CM | POA: Diagnosis not present

## 2023-10-02 MED ORDER — DIVALPROEX SODIUM 125 MG PO DR TAB: 125.0000 mg | DELAYED_RELEASE_TABLET | Freq: Every evening | ORAL | 11 refills | Status: DC

## 2023-10-02 NOTE — Progress Notes (Signed)
Assessment/Plan:   Dementia likely due to Alzheimer's disease and vascular etiology   [name] is a very pleasant 89 y.o. RH female with a history of hypertension, hypothyroidism, RLS seen today in follow up for memory loss. Patient is currently on donepezil 10 mg daily, tolerating well after a period of diarrhea. She has periods of sundowning which affect her sleep and performance during the daytime. Discussed starting Depakote, daughter agrees.  .     Follow up in 6  months. Continue donepezil10 mg daily, side effects discussed.  Start Depakote Take 1 tab at night , may increase to 1 tab twice a day if needed for sundowning and hallucinations  Recommend good control of her cardiovascular risk factors Continue to control mood as per PCP     Subjective:    This patient is accompanied in the office by daughter [name]  who supplements the history.  Previous records as well as any outside records available were reviewed prior to todays visit. Patient was last seen on 07/02/2023, with [facility] 12/30.    Any changes in memory since last visit? " Some good and bad days, especially in the morning if she did not have a good night of sleep.  She has difficulty remembering new information and recent conversations, names of people.  Sometimes she confuses family members. repeats oneself?  Endorsed Disoriented when walking into a room?  Patient denies    Leaving objects?  May misplace things but not in unusual places   Wandering behavior?  denies   Any personality changes since last visit?  As before, he may have moments of irritability, "gets panicky " Any worsening depression?:  Denies.   Hallucinations or paranoia?  She reports that she has seen her dead husband.  She hide things because she is concerned that someone will take her stuff. Seizures? denies    Any sleep changes?  Does not sleep well.  She has vivid dreams sometimes about her husband, she has RLS.  She takes medications for did  help.  Denies sleepwalking. Sleep apnea?   Denies.   Any hygiene concerns? Denies.  Independent of bathing and dressing?  Needs assistance getting dressed. Does the patient needs help with medications?  Patient is in charge   Who is in charge of the finances?  Family is in charge     Any changes in appetite?  denies.  She drinks plenty water    Patient have trouble swallowing? Denies.   Does the patient cook? No Any headaches?   denies   Chronic back pain  denies   Ambulates with difficulty? "She leans forward which she did not do before" " She does not use a cane or a walker" Recent falls or head injuries? denies     Unilateral weakness, numbness or tingling? denies   Any tremors?  Denies   Any anosmia?  Denies   Any incontinence of urine?  Endorsed, mild urge incontinence, wears pads "just in case " Any bowel dysfunction?  She had an episode of diarrhea on November 2024 requiring evaluation at the ED, unclear etiology.  In reviewing the notes, the patient is Aricept had been increased on 08/27/2023, there is concern that this might have been the culprit but she has fully recovered since..      Patient lives alone, daughter monitors  Does the patient drive?  No longer drives   MRI of the brain performed at [facility] on 06/26/2023 remarkable for remote lacunar infarcts within the left  thalamus and bilateral basal ganglia, sequela of moderate chronic small vessel disease, parenchymal brain volume loss with slight disproportionate involvement of the parietal lobes.findings are suspicious for dementia likely mixed due to Alzheimer's disease and vascular etiology.     Initial visit September 2024 How long did patient have memory difficulties?  For the last 6 months.  Patient has difficulty remembering new information, recent conversations and names of people.  Sometimes she confuses family members. repeats oneself?  Endorsed Disoriented when walking into a room?  Denies  Leaving objects in  unusual places?  May lose her bag but not in unusual places.  Wandering behavior? Denies.   Any personality changes, or depression, anxiety?  Has moments of irritability. "She may get panicky for ex: if the cleaning lady does not come on a certain day"-daughter says Hallucinations or paranoia?  She reports that has seen her husband. She hides stuff in case someone comes and takes her stuff. Seizures? Denies.    Any sleep changes? Does not sleep well, She had vivid dreams in the past, especially abut her dead husband, she has RLS and takes meds which help. Denies sleepwalking.   Sleep apnea? Denies.   Any hygiene concerns?  Denies.   Independent of bathing and dressing?  Needs assistance getting dressed.  Who is in charge of the medications?  Patient is in charge, denies missing any doses  Who is in charge of the finances?   is in charge     Any changes in appetite?   Denies.  She drinks plenty water.    Patient have trouble swallowing?  Denies.   Does the patient cook?  No   Any headaches?  Denies.   Chronic back pain?  Denies.   Ambulates with difficulty? Denies. Recent falls or head injuries? Not recently, she fell 2 times over the last 2 years. She hit her head with the bathtub but no LOC Vision changes? Denies. Stroke like symptoms?  Denies.   Any tremors?  Denies.   Any anosmia? For several years  Any incontinence of urine?uses pads just in case   Any bowel dysfunction? She has some constipation     Patient lives alone, her daughter monitors  History of heavy alcohol intake? Denies.   History of heavy tobacco use? Denies.   Family history of dementia?   Denies. Does patient drive?Yes, denies getting lost, she drives very short distances  PREVIOUS MEDICATIONS:   CURRENT MEDICATIONS:  Outpatient Encounter Medications as of 10/02/2023  Medication Sig   aspirin 81 MG tablet Take 81 mg by mouth daily with breakfast.    atorvastatin (LIPITOR) 10 MG tablet Take 10 mg by mouth daily.    calcium-vitamin D (OSCAL WITH D) 500-200 MG-UNIT tablet Take 1 tablet by mouth daily with breakfast.   divalproex (DEPAKOTE) 125 MG DR tablet Take 1 tablet (125 mg total) by mouth at bedtime.   donepezil (ARICEPT) 10 MG tablet Take one tablet daily   gabapentin (NEURONTIN) 300 MG capsule Take 300 mg by mouth 2 (two) times daily.    levothyroxine (SYNTHROID) 112 MCG tablet Take 112 mcg by mouth daily.   rOPINIRole (REQUIP) 0.5 MG tablet Take 0.5 mg by mouth 2 (two) times daily.   rOPINIRole (REQUIP) 1 MG tablet Take 1 mg by mouth at bedtime.   telmisartan (MICARDIS) 40 MG tablet Take 40 mg by mouth daily.   No facility-administered encounter medications on file as of 10/02/2023.        No data to  display            07/02/2023   12:00 PM  Montreal Cognitive Assessment   Visuospatial/ Executive (0/5) 0  Naming (0/3) 2  Attention: Read list of digits (0/2) 2  Attention: Read list of letters (0/1) 1  Attention: Serial 7 subtraction starting at 100 (0/3) 0  Language: Repeat phrase (0/2) 0  Language : Fluency (0/1) 0  Abstraction (0/2) 0  Delayed Recall (0/5) 0  Orientation (0/6) 6  Total 11  Adjusted Score (based on education) 12    Objective:     PHYSICAL EXAMINATION:    VITALS:   Vitals:   10/02/23 1436  BP: (!) 161/78  Pulse: 65  SpO2: 98%  Weight: 147 lb (66.7 kg)  Height: 5\' 1"  (1.549 m)    GEN:  The patient appears stated age and is in NAD. HEENT:  Normocephalic, atraumatic.   Neurological examination:  General: NAD, well-groomed, appears stated age. Orientation: The patient is alert. Oriented to person, not to place and date Cranial nerves: There is good facial symmetry. Anxious appearing The speech is fluent and clear. No aphasia or dysarthria. Fund of knowledge is reduced. Recent and remote memory are impaired. Attention and concentration are reduced.  Able to name objects and unable to repeat phrases.  Hearing is intact to conversational tone.    Sensation: Sensation is intact to light touch throughout Motor: Strength is at least antigravity x4. DTR's 2/4 in UE/LE     Movement examination: Tone: There is normal tone in the UE/LE Abnormal movements:  no tremor.  No myoclonus.  No asterixis.   Coordination:  There is no decremation with RAM's. Normal finger to nose  Gait and Station: The patient has some difficulty arising out of a deep-seated chair without the use of the hands. Mild flex forward.  The patient's stride length is good.  Gait is cautious and narrow.    Thank you for allowing [place] the opportunity to participate in the care of this nice patient. Please do not hesitate to contact us for any questions or concerns.   Total time spent on today's visit was 40 minutes dedicated to this patient today, preparing to see patient, examining the patient, ordering tests and/or medications and counseling the patient, documenting clinical information in the EHR or other health record, independently interpreting results and communicating results to the patient/family, discussing treatment and goals, answering patient's questions and coordinating care.  Cc:  Medicine, [facility] 10/02/2023 3:16 PM

## 2023-10-02 NOTE — Patient Instructions (Addendum)
It was a pleasure to see you today at our office.   Recommendations:  Follow up in 6 months Continue  donepezil 10 mg daily Start Depakote Take 1 tab at night , may increase to 1 tab twice a day if needed  Use the walker!!!!   For psychiatric meds, mood meds: Please have your primary care physician manage these medications.  If you have any severe symptoms of a stroke, or other severe issues such as confusion,severe chills or fever, etc call 911 or go to the ER as you may need to be evaluated further  For guidance regarding [facility] and if placement were needed at the facility, contact Social Worker tel: [phone]  For assessment of decision of mental capacity and competency:  Call Dr. Erick Blinks, geriatric psychiatrist at [phone]  Counseling regarding caregiver distress, including caregiver depression, anxiety and issues regarding community resources, adult day care programs, adult living facilities, or memory care questions:  please contact your  Primary Doctor's Social Worker   Whom to call: Memory  decline, memory medications: Call our office [phone]    [email or web address]   RECOMMENDATIONS FOR ALL PATIENTS WITH MEMORY PROBLEMS: 1. Continue to exercise (Recommend 30 minutes of walking everyday, or 3 hours every week) 2. Increase social interactions - continue going to [place] and enjoy social gatherings with friends and family 3. Eat healthy, avoid fried foods and eat more fruits and vegetables 4. Maintain adequate blood pressure, blood sugar, and blood cholesterol level. Reducing the risk of stroke and cardiovascular disease also helps promoting better memory. 5. Avoid stressful situations. Live a simple life and avoid aggravations. Organize your time and prepare for the next day in anticipation. 6. Sleep well, avoid any interruptions of sleep and avoid any distractions in the bedroom that  may interfere with adequate sleep quality 7. Avoid sugar, avoid sweets as there is a strong link between excessive sugar intake, diabetes, and cognitive impairment We discussed the Mediterranean diet, which has been shown to help patients reduce the risk of progressive memory disorders and reduces cardiovascular risk. This includes eating fish, eat fruits and green leafy vegetables, nuts like almonds and hazelnuts, walnuts, and also use olive oil. Avoid fast foods and fried foods as much as possible. Avoid sweets and sugar as sugar use has been linked to worsening of memory function.  There is always a concern of gradual progression of memory problems. If this is the case, then we may need to adjust level of care according to patient needs. Support, both to the patient and caregiver, should then be put into place.      FALL PRECAUTIONS: Be cautious when walking. Scan the area for obstacles that may increase the risk of trips and falls. When getting up in the mornings, sit up at the edge of the bed for a few minutes before getting out of bed. Consider elevating the bed at the head end to avoid drop of blood pressure when getting up. Walk always in a well-lit room (use night lights in the walls). Avoid area rugs or power cords from appliances in the middle of the walkways. Use a walker or a cane if necessary and consider physical therapy for balance exercise. Get your eyesight checked regularly.  FINANCIAL OVERSIGHT: Supervision, especially oversight when making financial decisions or transactions is also recommended.  HOME SAFETY: Consider the safety of the kitchen when operating appliances like stoves, microwave oven, and blender. Consider having supervision and share cooking responsibilities until no longer  able to participate in those. Accidents with firearms and other hazards in the house should be identified and addressed as well.   ABILITY TO BE LEFT ALONE: If patient is unable to contact 911  operator, consider using LifeLine, or when the need is there, arrange for someone to stay with patients. Smoking is a fire hazard, consider supervision or cessation. Risk of wandering should be assessed by caregiver and if detected at any point, supervision and safe proof recommendations should be instituted.  MEDICATION SUPERVISION: Inability to self-administer medication needs to be constantly addressed. Implement a mechanism to ensure safe administration of the medications.      Mediterranean Diet A Mediterranean diet refers to food and lifestyle choices that are based on the traditions of countries located on the [employer]. This way of eating has been shown to help prevent certain conditions and improve outcomes for people who have chronic diseases, like kidney disease and heart disease. What are tips for following this plan? Lifestyle  Cook and eat meals together with your family, when possible. Drink enough fluid to keep your urine clear or pale yellow. Be physically active every day. This includes: Aerobic exercise like running or swimming. Leisure activities like gardening, walking, or housework. Get 7-8 hours of sleep each night. If recommended by your health care provider, drink red wine in moderation. This means 1 glass a day for nonpregnant women and 2 glasses a day for men. A glass of wine equals 5 oz (150 mL). Reading food labels  Check the serving size of packaged foods. For foods such as rice and pasta, the serving size refers to the amount of cooked product, not dry. Check the total fat in packaged foods. Avoid foods that have saturated fat or trans fats. Check the ingredients list for added sugars, such as corn syrup. Shopping  At the grocery store, buy most of your food from the areas near the walls of the store. This includes: Fresh fruits and vegetables (produce). Grains, beans, nuts, and seeds. Some of these may be available in unpackaged forms or large amounts  (in bulk). Fresh seafood. Poultry and eggs. Low-fat dairy products. Buy whole ingredients instead of prepackaged foods. Buy fresh fruits and vegetables in-season from local farmers markets. Buy frozen fruits and vegetables in resealable bags. If you do not have access to quality fresh seafood, buy precooked frozen shrimp or canned fish, such as tuna, salmon, or sardines. Buy small amounts of raw or cooked vegetables, salads, or olives from the deli or salad bar at your store. Stock your pantry so you always have certain foods on hand, such as olive oil, canned tuna, canned tomatoes, rice, pasta, and beans. Cooking  Cook foods with extra-virgin olive oil instead of using butter or other vegetable oils. Have meat as a side dish, and have vegetables or grains as your main dish. This means having meat in small portions or adding small amounts of meat to foods like pasta or stew. Use beans or vegetables instead of meat in common dishes like chili or lasagna. Experiment with different cooking methods. Try roasting or broiling vegetables instead of steaming or sauteing them. Add frozen vegetables to soups, stews, pasta, or rice. Add nuts or seeds for added healthy fat at each meal. You can add these to yogurt, salads, or vegetable dishes. Marinate fish or vegetables using olive oil, lemon juice, garlic, and fresh herbs. Meal planning  Plan to eat 1 vegetarian meal one day each week. Try to work up to  2 vegetarian meals, if possible. Eat seafood 2 or more times a week. Have healthy snacks readily available, such as: Vegetable sticks with hummus. Greek yogurt. Fruit and nut trail mix. Eat balanced meals throughout the week. This includes: Fruit: 2-3 servings a day Vegetables: 4-5 servings a day Low-fat dairy: 2 servings a day Fish, poultry, or lean meat: 1 serving a day Beans and legumes: 2 or more servings a week Nuts and seeds: 1-2 servings a day Whole grains: 6-8 servings a  day Extra-virgin olive oil: 3-4 servings a day Limit red meat and sweets to only a few servings a month What are my food choices? Mediterranean diet Recommended Grains: Whole-grain pasta. Brown rice. Bulgar wheat. Polenta. Couscous. Whole-wheat bread. Orpah Cobb. Vegetables: Artichokes. Beets. Broccoli. Cabbage. Carrots. Eggplant. Green beans. Chard. Kale. Spinach. Onions. Leeks. Peas. Squash. Tomatoes. Peppers. Radishes. Fruits: Apples. Apricots. Avocado. Berries. Bananas. Cherries. Dates. Figs. Grapes. Lemons. Melon. Oranges. Peaches. Plums. Pomegranate. Meats and other protein foods: Beans. Almonds. Sunflower seeds. Pine nuts. Peanuts. Cod. Salmon. Scallops. Shrimp. Tuna. Tilapia. Clams. Oysters. Eggs. Dairy: Low-fat milk. Cheese. Greek yogurt. Beverages: Water. Red wine. Herbal tea. Fats and oils: Extra virgin olive oil. Avocado oil. Grape seed oil. Sweets and desserts: [place] yogurt with honey. Baked apples. Poached pears. Trail mix. Seasoning and other foods: Basil. Cilantro. Coriander. Cumin. Mint. Parsley. Sage. Rosemary. Tarragon. Garlic. Oregano. Thyme. Pepper. Balsalmic vinegar. Tahini. Hummus. Tomato sauce. Olives. Mushrooms. Limit these Grains: Prepackaged pasta or rice dishes. Prepackaged cereal with added sugar. Vegetables: Deep fried potatoes (french fries). Fruits: Fruit canned in syrup. Meats and other protein foods: Beef. Pork. Lamb. Poultry with skin. Hot dogs. Tomasa Blase. Dairy: Ice cream. Sour cream. Whole milk. Beverages: Juice. Sugar-sweetened soft drinks. Beer. Liquor and spirits. Fats and oils: Butter. Canola oil. Vegetable oil. Beef fat (tallow). Lard. Sweets and desserts: Cookies. Cakes. Pies. Candy. Seasoning and other foods: Mayonnaise. Premade sauces and marinades. The items listed may not be a complete list. Talk with your dietitian about what dietary choices are right for you. Summary The Mediterranean diet includes both food and lifestyle choices. Eat a  variety of fresh fruits and vegetables, beans, nuts, seeds, and whole grains. Limit the amount of red meat and sweets that you eat. Talk with your health care provider about whether it is safe for you to drink red wine in moderation. This means 1 glass a day for nonpregnant women and 2 glasses a day for men. A glass of wine equals 5 oz (150 mL). This information is not intended to replace advice given to you by your health care provider. Make sure you discuss any questions you have with your health care provider. Document Released: 05/23/2016 Document Revised: 06/25/2016 Document Reviewed: 05/23/2016 Elsevier Interactive Patient Education  2017 [employer].

## 2024-02-06 ENCOUNTER — Encounter: Payer: Self-pay | Admitting: Physician Assistant

## 2024-03-23 ENCOUNTER — Ambulatory Visit: Admitting: Physician Assistant

## 2024-03-23 DIAGNOSIS — Z029 Encounter for administrative examinations, unspecified: Secondary | ICD-10-CM

## 2024-03-23 NOTE — Progress Notes (Incomplete)
 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 RH female with a history of hypertension, hypothyroidism, RLS seen today in follow up for memory loss. Patient is currently on donepezil  10 mg daily, tolerating well.  Sundowning is uncontrolled with Depakote ***.      Follow up in   months. Continue donepezil  10 mg daily, side effects discussed Continue Depakote  125 mg twice daily as needed for sundowning and hallucinations Recommend good control of her cardiovascular risk factors      Subjective:    This patient is accompanied in the office by her daughter*** who supplements the history.  Previous records as well as any outside records available were reviewed prior to todays visit. Patient was last seen on 10/02/2023, with last MoCA on September 2024 at 12/30  Any changes in memory since last visit? " Good and bad days, he is not having a good night of sleep that might affect her memory.  Short-term memory is worse than long-term memory.  She continues to confuse family members. repeats oneself?  Endorsed Disoriented when walking into a room? Denies ***  Leaving objects?  May misplace things but not in unusual places***  Wandering behavior?  denies   Any personality changes since last visit?  As before, sometimes she can become anxious. Any worsening depression?:  Denies.   Hallucinations or paranoia?  Sometimes she sees her dead husband as before.  She may hide things because she is afraid that somebody will touch them. Seizures? denies    Any sleep changes?  Denies vivid dreams, REM behavior or sleepwalking   Sleep apnea?   Denies.   Any hygiene concerns? Denies.  Independent of bathing and dressing?  Endorsed  Does the patient needs help with medications?   is in charge *** Who is in charge of the finances?   is in charge   *** Any changes in appetite?  denies.  She drinks plenty water***    Patient have trouble swallowing? Denies.   Does the patient cook? No Any headaches?   denies   Any vision changes?*** Chronic back pain  denies   Ambulates with difficulty? Denies.  *** Recent falls or head injuries? Denies.     Unilateral weakness, numbness or tingling? denies   Any tremors?  Denies   Any anosmia?  Denies   Any incontinence of urine?  Endorsed, mild urge incontinence, wears pads Any bowel dysfunction?   Denies      Patient lives alone, daughter monitors*** Does the patient drive? No longer drives ***  MRI of the brain performed at [facility] on 06/26/2023 remarkable for remote lacunar infarcts within the left thalamus and bilateral basal ganglia, sequela of moderate chronic small vessel disease, parenchymal brain volume loss with slight disproportionate involvement of the parietal lobes.findings are suspicious for dementia likely mixed due to Alzheimer's disease and vascular etiology.       Initial visit September 2024 How long did patient have memory difficulties?  For the last 6 months.  Patient has difficulty remembering new information, recent conversations and names of people.  Sometimes she confuses family members. repeats oneself?  Endorsed Disoriented when walking into a room?  Denies  Leaving objects in unusual places?  May lose her bag but not in unusual places.  Wandering behavior? Denies.   Any personality changes, or depression, anxiety?  Has moments of irritability. "She may get panicky for ex: if the cleaning lady  does not come on a certain Morse"-daughter says Hallucinations or paranoia?  She reports that has seen her husband. She hides stuff in case someone comes and takes her stuff. Seizures? Denies.    Any sleep changes? Does not sleep well, She had vivid dreams in the past, especially abut her dead husband, she has RLS and takes meds which help. Denies sleepwalking.   Sleep apnea? Denies.   Any hygiene concerns?  Denies.   Independent of bathing and  dressing?  Needs assistance getting dressed.  Who is in charge of the medications?  Patient is in charge, denies missing any doses  Who is in charge of the finances?   is in charge     Any changes in appetite?   Denies.  She drinks plenty water.    Patient have trouble swallowing?  Denies.   Does the patient cook?  No   Any headaches?  Denies.   Chronic back pain?  Denies.   Ambulates with difficulty? Denies. Recent falls or head injuries? Not recently, she fell 2 times over the last 2 years. She hit her head with the bathtub but no LOC Vision changes? Denies. Stroke like symptoms?  Denies.   Any tremors?  Denies.   Any anosmia? For several years  Any incontinence of urine?uses pads just in case   Any bowel dysfunction? She has some constipation     Patient lives alone, her daughter monitors  History of heavy alcohol intake? Denies.   History of heavy tobacco use? Denies.   Family history of dementia?   Denies. Does patient drive?Yes, denies getting lost, she drives very short distances  PREVIOUS MEDICATIONS:   CURRENT MEDICATIONS:  Outpatient Encounter Medications as of 03/23/2024  Medication Sig   aspirin 81 MG tablet Take 81 mg by mouth daily with breakfast.    atorvastatin  (LIPITOR) 10 MG tablet Take 10 mg by mouth daily.   calcium -vitamin D (OSCAL WITH D) 500-200 MG-UNIT tablet Take 1 tablet by mouth daily with breakfast.   divalproex  (DEPAKOTE ) 125 MG DR tablet Take 1 tablet (125 mg total) by mouth at bedtime.   donepezil  (ARICEPT ) 10 MG tablet Take one tablet daily   gabapentin  (NEURONTIN ) 300 MG capsule Take 300 mg by mouth 2 (two) times daily.    levothyroxine  (SYNTHROID ) 112 MCG tablet Take 112 mcg by mouth daily.   rOPINIRole  (REQUIP ) 0.5 MG tablet Take 0.5 mg by mouth 2 (two) times daily.   rOPINIRole  (REQUIP ) 1 MG tablet Take 1 mg by mouth at bedtime.   telmisartan (MICARDIS) 40 MG tablet Take 40 mg by mouth daily.   No facility-administered encounter medications on  file as of 03/23/2024.        No data to display            07/02/2023   12:00 PM  Montreal Cognitive Assessment   Visuospatial/ Executive (0/5) 0  Naming (0/3) 2  Attention: Read list of digits (0/2) 2  Attention: Read list of letters (0/1) 1  Attention: Serial 7 subtraction starting at 100 (0/3) 0  Language: Repeat phrase (0/2) 0  Language : Fluency (0/1) 0  Abstraction (0/2) 0  Delayed Recall (0/5) 0  Orientation (0/6) 6  Total 11  Adjusted Score (based on education) 12    Objective:     PHYSICAL EXAMINATION:    VITALS:  There were no vitals filed for this visit.  GEN:  The patient appears stated age and is in NAD. HEENT:  Normocephalic, atraumatic.  Neurological examination:  General: NAD, well-groomed, appears stated age. Orientation: The patient is alert. Oriented to person, place and date Cranial nerves: There is good facial symmetry.anxious appearing the speech is fluent and clear. No aphasia or dysarthria. Fund of knowledge is reduced. Recent and remote memory are impaired. Attention and concentration are reduced. Able to name objects and repeat phrases.  Hearing is intact to conversational tone. *** Sensation: Sensation is intact to light touch throughout Motor: Strength is at least antigravity x4. DTR's 2/4 in UE/LE     Movement examination: Tone: There is normal tone in the UE/LE Abnormal movements:  no tremor.  No myoclonus.  No asterixis.   Coordination:  There is no decremation with RAM's. Normal finger to nose  Gait and Station: The patient has no*** difficulty arising out of a deep-seated chair without the use of the hands. The patient's stride length is good.  Gait is cautious and narrow.    Thank you for allowing us  the opportunity to participate in the care of this nice patient. Please do not hesitate to contact us  for any questions or concerns.   Total time spent on today's visit was *** minutes dedicated to this patient today, preparing to  see patient, examining the patient, ordering tests and/or medications and counseling the patient, documenting clinical information in the EHR or other health record, independently interpreting results and communicating results to the patient/family, discussing treatment and goals, answering patient's questions and coordinating care.  Cc:  Medicine, [facility] (Inactive)  Tex Filbert 03/23/2024 6:08 AM

## 2024-03-24 ENCOUNTER — Encounter: Payer: Self-pay | Admitting: Physician Assistant

## 2024-03-30 NOTE — Progress Notes (Signed)
 Assessment/Plan:   Dementia likely due to Alzheimer's disease  [name] is a very pleasant 89 y.o. RH female with a history of hypertension, hypothyroidism, RLS seen today in follow up for memory loss. Patient is currently on donepezil  10 milligrams daily.  She has episodes of sundowning, affecting her sleep and performance.  She is on Depakote  125 mg at night, discussed with her daughter increasing need to 250 mg at night, may take a second dose during the day at 125 mg if needed.  The diabetes will try.  In addition, we discussed adding memantine 5 mg at night for memory in view of a slight decline, daughter agrees to proceed.  She is able to participate on some of her activities of daily living, continues to live alone with daughter supervision.  We discussed the need for increased monitoring for safety, assisted living placement, daughter may entertain the idea.   Follow up in  6 months. Continue donepezil  10 mg daily, side effects discussed Start memantine 5 mg at night, side effects discussed Continue Depakote  125 mg nightly, may increase to 1 tablet twice a day if needed for sundowning and hallucinations, side effects discussed Recommend good control of her cardiovascular risk factors Continue to control mood as per PCP     Subjective:    This patient is accompanied in the office by her daughter who supplements the history.  Previous records as well as any outside records available were reviewed prior to todays visit. Patient was last seen on 10/02/2023.   Any changes in memory since last visit? About the same.  Daughter reports that it may be slightly worse, both short-term and long-term. With short-term memory, he has issues with recent conversations and names of people, including that of her daughter.  Sometimes she confuses family members when her daughter is a caregiver.  If she sleeps well she has a better day  repeats oneself?  Endorsed Disoriented when walking  into a room? She may get confused with rooms    Leaving objects?  Endorsed by her daughter   Wandering behavior?  denies   Any personality changes since last visit?  As before, he has moments of irritability. Any worsening depression?:  Denies.   Hallucinations or paranoia?  She sees people in her bedroom at night.  She hides things because she feels that someone will mess with her stuff.   Seizures? denies   Any sleep changes?  Sleeps better with Depakote , may wake up several times at night because the people are in my room .  Denies vivid dreams, REM behavior or sleepwalking   Sleep apnea?   Denies.   Any hygiene concerns? Denies.  Independent of bathing and dressing?  Endorsed  Does the patient needs help with medications?  Daughter is in  charge, because otherwise she may forget   Who is in charge of the finances? Daughter is in charge     Any changes in appetite?  denies.  Drinks plenty water    Patient have trouble swallowing? Denies.   Does the patient cook? No Any headaches?   denies   Any vision changes? Denies    Chronic back pain  denies   Ambulates with difficulty? Denies.  She has not using a cane or walker.  Recent falls or head injuries? Denies.     Unilateral weakness, numbness or tingling? denies   Any tremors?  Denies   Any anosmia?  Denies   Any incontinence of urine?  Endorsed,  mild urge incontinence, wears pads just in case.  Any bowel dysfunction?   Denies      Patient lives alone, daughter monitors Does the patient drive? No longer drives   MRI of the brain performed at [facility] on 06/26/2023 remarkable for remote lacunar infarcts within the left thalamus and bilateral basal ganglia, sequela of moderate chronic small vessel disease, parenchymal brain volume loss with slight disproportionate involvement of the parietal lobes.    Initial visit September 2024 How long did patient have memory difficulties?  For the last 6 months.  Patient has difficulty remembering  new information, recent conversations and names of people.  Sometimes she confuses family members. repeats oneself?  Endorsed Disoriented when walking into a room?  Denies  Leaving objects in unusual places?  May lose her bag but not in unusual places.  Wandering behavior? Denies.   Any personality changes, or depression, anxiety?  Has moments of irritability. She may get panicky for ex: if the cleaning lady does not come on a certain day-daughter says Hallucinations or paranoia?  She reports that has seen her husband. She hides stuff in case someone comes and takes her stuff. Seizures? Denies.    Any sleep changes? Does not sleep well, She had vivid dreams in the past, especially abut her dead husband, she has RLS and takes meds which help. Denies sleepwalking.   Sleep apnea? Denies.   Any hygiene concerns?  Denies.   Independent of bathing and dressing?  Needs assistance getting dressed.  Who is in charge of the medications?  Patient is in charge, denies missing any doses  Who is in charge of the finances?   is in charge     Any changes in appetite?   Denies.  She drinks plenty water.    Patient have trouble swallowing?  Denies.   Does the patient cook?  No   Any headaches?  Denies.   Chronic back pain?  Denies.   Ambulates with difficulty? Denies. Recent falls or head injuries? Not recently, she fell 2 times over the last 2 years. She hit her head with the bathtub but no LOC Vision changes? Denies. Stroke like symptoms?  Denies.   Any tremors?  Denies.   Any anosmia? For several years  Any incontinence of urine?uses pads just in case   Any bowel dysfunction? She has some constipation     Patient lives alone, her daughter monitors  History of heavy alcohol intake? Denies.   History of heavy tobacco use? Denies.   Family history of dementia?   Denies. Does patient drive?Yes, denies getting lost, she drives very short distances  PREVIOUS MEDICATIONS:   CURRENT MEDICATIONS:   Outpatient Encounter Medications as of 03/31/2024  Medication Sig   memantine (NAMENDA) 5 MG tablet Take 1 tablet (5 mg total) by mouth at bedtime.   aspirin 81 MG tablet Take 81 mg by mouth daily with breakfast.    atorvastatin  (LIPITOR) 10 MG tablet Take 10 mg by mouth daily.   calcium -vitamin D (OSCAL WITH D) 500-200 MG-UNIT tablet Take 1 tablet by mouth daily with breakfast.   divalproex  (DEPAKOTE ) 125 MG DR tablet Take two tabs at night   donepezil  (ARICEPT ) 10 MG tablet Take one tablet daily   gabapentin  (NEURONTIN ) 300 MG capsule Take 300 mg by mouth 2 (two) times daily.    levothyroxine  (SYNTHROID ) 112 MCG tablet Take 112 mcg by mouth daily.   rOPINIRole  (REQUIP ) 0.5 MG tablet Take 0.5 mg by mouth 2 (two) times  daily.   rOPINIRole  (REQUIP ) 1 MG tablet Take 1 mg by mouth at bedtime.   telmisartan (MICARDIS) 40 MG tablet Take 40 mg by mouth daily.   [DISCONTINUED] divalproex  (DEPAKOTE ) 125 MG DR tablet Take 1 tablet (125 mg total) by mouth at bedtime.   [DISCONTINUED] donepezil  (ARICEPT ) 10 MG tablet Take one tablet daily   No facility-administered encounter medications on file as of 03/31/2024.        No data to display            07/02/2023   12:00 PM  Montreal Cognitive Assessment   Visuospatial/ Executive (0/5) 0  Naming (0/3) 2  Attention: Read list of digits (0/2) 2  Attention: Read list of letters (0/1) 1  Attention: Serial 7 subtraction starting at 100 (0/3) 0  Language: Repeat phrase (0/2) 0  Language : Fluency (0/1) 0  Abstraction (0/2) 0  Delayed Recall (0/5) 0  Orientation (0/6) 6  Total 11  Adjusted Score (based on education) 12    Objective:     PHYSICAL EXAMINATION:    VITALS:   Vitals:   03/31/24 1453  BP: (!) 175/82  Pulse: 69  Resp: 18  SpO2: 95%  Height: 5' 1 (1.549 m)    GEN:  The patient appears stated age and is in NAD. HEENT:  Normocephalic, atraumatic.   Neurological examination:  General: NAD, well-groomed, appears stated  age. Orientation: The patient is alert.  Not oriented to person, place and date Cranial nerves: There is good facial symmetry.anxious appearing.  The speech is fluent and clear. No aphasia or dysarthria. Fund of knowledge is reduced. Recent and remote memory are impaired. Attention and concentration are reduced. Able to name objects and repeat phrases.  Hearing is intact to conversational tone.  Sensation: Sensation is intact to light touch throughout Motor: Strength is at least antigravity x4. DTR's 2/4 in UE/LE     Movement examination: Tone: There is normal tone in the UE/LE Abnormal movements:  no tremor.  No myoclonus.  No asterixis.   Coordination:  There is no decremation with RAM's. Normal finger to nose  Gait and Station: The patient has no difficulty arising out of a deep-seated chair without the use of the hands. The patient's stride length is good with his he tends.  Gait is cautious and narrow.    Thank you for allowing us  the opportunity to participate in the care of this nice patient. Please do not hesitate to contact us  for any questions or concerns.   Total time spent on today's visit was 30 minutes dedicated to this patient today, preparing to see patient, examining the patient, ordering tests and/or medications and counseling the patient, documenting clinical information in the EHR or other health record, independently interpreting results and communicating results to the patient/family, discussing treatment and goals, answering patient's questions and coordinating care.  Cc:  McClanahan, Kyra, NP  Tex Filbert 03/31/2024 6:51 PM

## 2024-03-31 ENCOUNTER — Encounter: Payer: Self-pay | Admitting: Physician Assistant

## 2024-03-31 ENCOUNTER — Ambulatory Visit (INDEPENDENT_AMBULATORY_CARE_PROVIDER_SITE_OTHER): Admitting: Physician Assistant

## 2024-03-31 VITALS — BP 175/82 | HR 69 | Resp 18 | Ht 61.0 in

## 2024-03-31 DIAGNOSIS — G309 Alzheimer's disease, unspecified: Secondary | ICD-10-CM | POA: Diagnosis not present

## 2024-03-31 DIAGNOSIS — F0280 Dementia in other diseases classified elsewhere without behavioral disturbance: Secondary | ICD-10-CM

## 2024-03-31 MED ORDER — MEMANTINE HCL 5 MG PO TABS: 5.0000 mg | ORAL_TABLET | Freq: Every evening | ORAL | 11 refills | Status: DC

## 2024-03-31 MED ORDER — DONEPEZIL HCL 10 MG PO TABS: ORAL_TABLET | ORAL | 11 refills | Status: AC

## 2024-03-31 MED ORDER — DIVALPROEX SODIUM 125 MG PO DR TAB: DELAYED_RELEASE_TABLET | ORAL | 11 refills | Status: AC

## 2024-03-31 NOTE — Patient Instructions (Signed)
 It was a pleasure to see you today at our office.   Recommendations:  Follow up in 6 months Continue  donepezil  10 mg daily Star memantine 5 mg at night Increase  Depakote  Take  two tabs  at night , may increase to 1 tab twice a day if needed  Use the walker!!!!   For psychiatric meds, mood meds: Please have your primary care physician manage these medications.  If you have any severe symptoms of a stroke, or other severe issues such as confusion,severe chills or fever, etc call 911 or go to the ER as you may need to be evaluated further  For guidance regarding [facility] and if placement were needed at the facility, contact Social Worker tel: [phone]  For assessment of decision of mental capacity and competency:  Call Dr. Laverne Potter, geriatric psychiatrist at [phone]  Counseling regarding caregiver distress, including caregiver depression, anxiety and issues regarding community resources, adult day care programs, adult living facilities, or memory care questions:  please contact your  Primary Doctor's Social Worker   Whom to call: Memory  decline, memory medications: Call our office [phone]    [email or web address]   RECOMMENDATIONS FOR ALL PATIENTS WITH MEMORY PROBLEMS: 1. Continue to exercise (Recommend 30 minutes of walking everyday, or 3 hours every week) 2. Increase social interactions - continue going to [place] and enjoy social gatherings with friends and family 3. Eat healthy, avoid fried foods and eat more fruits and vegetables 4. Maintain adequate blood pressure, blood sugar, and blood cholesterol level. Reducing the risk of stroke and cardiovascular disease also helps promoting better memory. 5. Avoid stressful situations. Live a simple life and avoid aggravations. Organize your time and prepare for the next day in anticipation. 6. Sleep well, avoid any interruptions of sleep and  avoid any distractions in the bedroom that may interfere with adequate sleep quality 7. Avoid sugar, avoid sweets as there is a strong link between excessive sugar intake, diabetes, and cognitive impairment We discussed the Mediterranean diet, which has been shown to help patients reduce the risk of progressive memory disorders and reduces cardiovascular risk. This includes eating fish, eat fruits and green leafy vegetables, nuts like almonds and hazelnuts, walnuts, and also use olive oil. Avoid fast foods and fried foods as much as possible. Avoid sweets and sugar as sugar use has been linked to worsening of memory function.  There is always a concern of gradual progression of memory problems. If this is the case, then we may need to adjust level of care according to patient needs. Support, both to the patient and caregiver, should then be put into place.      FALL PRECAUTIONS: Be cautious when walking. Scan the area for obstacles that may increase the risk of trips and falls. When getting up in the mornings, sit up at the edge of the bed for a few minutes before getting out of bed. Consider elevating the bed at the head end to avoid drop of blood pressure when getting up. Walk always in a well-lit room (use night lights in the walls). Avoid area rugs or power cords from appliances in the middle of the walkways. Use a walker or a cane if necessary and consider physical therapy for balance exercise. Get your eyesight checked regularly.  FINANCIAL OVERSIGHT: Supervision, especially oversight when making financial decisions or transactions is also recommended.  HOME SAFETY: Consider the safety of the kitchen when operating appliances like stoves, microwave oven, and blender. Consider  having supervision and share cooking responsibilities until no longer able to participate in those. Accidents with firearms and other hazards in the house should be identified and addressed as well.   ABILITY TO BE LEFT  ALONE: If patient is unable to contact 911 operator, consider using LifeLine, or when the need is there, arrange for someone to stay with patients. Smoking is a fire hazard, consider supervision or cessation. Risk of wandering should be assessed by caregiver and if detected at any point, supervision and safe proof recommendations should be instituted.  MEDICATION SUPERVISION: Inability to self-administer medication needs to be constantly addressed. Implement a mechanism to ensure safe administration of the medications.      Mediterranean Diet A Mediterranean diet refers to food and lifestyle choices that are based on the traditions of countries located on the [employer]. This way of eating has been shown to help prevent certain conditions and improve outcomes for people who have chronic diseases, like kidney disease and heart disease. What are tips for following this plan? Lifestyle  Cook and eat meals together with your family, when possible. Drink enough fluid to keep your urine clear or pale yellow. Be physically active every day. This includes: Aerobic exercise like running or swimming. Leisure activities like gardening, walking, or housework. Get 7-8 hours of sleep each night. If recommended by your health care provider, drink red wine in moderation. This means 1 glass a day for nonpregnant women and 2 glasses a day for men. A glass of wine equals 5 oz (150 mL). Reading food labels  Check the serving size of packaged foods. For foods such as rice and pasta, the serving size refers to the amount of cooked product, not dry. Check the total fat in packaged foods. Avoid foods that have saturated fat or trans fats. Check the ingredients list for added sugars, such as corn syrup. Shopping  At the grocery store, buy most of your food from the areas near the walls of the store. This includes: Fresh fruits and vegetables (produce). Grains, beans, nuts, and seeds. Some of these may be  available in unpackaged forms or large amounts (in bulk). Fresh seafood. Poultry and eggs. Low-fat dairy products. Buy whole ingredients instead of prepackaged foods. Buy fresh fruits and vegetables in-season from local farmers markets. Buy frozen fruits and vegetables in resealable bags. If you do not have access to quality fresh seafood, buy precooked frozen shrimp or canned fish, such as tuna, salmon, or sardines. Buy small amounts of raw or cooked vegetables, salads, or olives from the deli or salad bar at your store. Stock your pantry so you always have certain foods on hand, such as olive oil, canned tuna, canned tomatoes, rice, pasta, and beans. Cooking  Cook foods with extra-virgin olive oil instead of using butter or other vegetable oils. Have meat as a side dish, and have vegetables or grains as your main dish. This means having meat in small portions or adding small amounts of meat to foods like pasta or stew. Use beans or vegetables instead of meat in common dishes like chili or lasagna. Experiment with different cooking methods. Try roasting or broiling vegetables instead of steaming or sauteing them. Add frozen vegetables to soups, stews, pasta, or rice. Add nuts or seeds for added healthy fat at each meal. You can add these to yogurt, salads, or vegetable dishes. Marinate fish or vegetables using olive oil, lemon juice, garlic, and fresh herbs. Meal planning  Plan to eat 1 vegetarian meal  one day each week. Try to work up to 2 vegetarian meals, if possible. Eat seafood 2 or more times a week. Have healthy snacks readily available, such as: Vegetable sticks with hummus. Greek yogurt. Fruit and nut trail mix. Eat balanced meals throughout the week. This includes: Fruit: 2-3 servings a day Vegetables: 4-5 servings a day Low-fat dairy: 2 servings a day Fish, poultry, or lean meat: 1 serving a day Beans and legumes: 2 or more servings a week Nuts and seeds: 1-2 servings a  day Whole grains: 6-8 servings a day Extra-virgin olive oil: 3-4 servings a day Limit red meat and sweets to only a few servings a month What are my food choices? Mediterranean diet Recommended Grains: Whole-grain pasta. Brown rice. Bulgar wheat. Polenta. Couscous. Whole-wheat bread. Dwyane Glad. Vegetables: Artichokes. Beets. Broccoli. Cabbage. Carrots. Eggplant. Green beans. Chard. Kale. Spinach. Onions. Leeks. Peas. Squash. Tomatoes. Peppers. Radishes. Fruits: Apples. Apricots. Avocado. Berries. Bananas. Cherries. Dates. Figs. Grapes. Lemons. Melon. Oranges. Peaches. Plums. Pomegranate. Meats and other protein foods: Beans. Almonds. Sunflower seeds. Pine nuts. Peanuts. Cod. Salmon. Scallops. Shrimp. Tuna. Tilapia. Clams. Oysters. Eggs. Dairy: Low-fat milk. Cheese. Greek yogurt. Beverages: Water. Red wine. Herbal tea. Fats and oils: Extra virgin olive oil. Avocado oil. Grape seed oil. Sweets and desserts: [place] yogurt with honey. Baked apples. Poached pears. Trail mix. Seasoning and other foods: Basil. Cilantro. Coriander. Cumin. Mint. Parsley. Sage. Rosemary. Tarragon. Garlic. Oregano. Thyme. Pepper. Balsalmic vinegar. Tahini. Hummus. Tomato sauce. Olives. Mushrooms. Limit these Grains: Prepackaged pasta or rice dishes. Prepackaged cereal with added sugar. Vegetables: Deep fried potatoes (french fries). Fruits: Fruit canned in syrup. Meats and other protein foods: Beef. Pork. Lamb. Poultry with skin. Hot dogs. Helene Loader. Dairy: Ice cream. Sour cream. Whole milk. Beverages: Juice. Sugar-sweetened soft drinks. Beer. Liquor and spirits. Fats and oils: Butter. Canola oil. Vegetable oil. Beef fat (tallow). Lard. Sweets and desserts: Cookies. Cakes. Pies. Candy. Seasoning and other foods: Mayonnaise. Premade sauces and marinades. The items listed may not be a complete list. Talk with your dietitian about what dietary choices are right for you. Summary The Mediterranean diet includes both  food and lifestyle choices. Eat a variety of fresh fruits and vegetables, beans, nuts, seeds, and whole grains. Limit the amount of red meat and sweets that you eat. Talk with your health care provider about whether it is safe for you to drink red wine in moderation. This means 1 glass a day for nonpregnant women and 2 glasses a day for men. A glass of wine equals 5 oz (150 mL). This information is not intended to replace advice given to you by your health care provider. Make sure you discuss any questions you have with your health care provider. Document Released: 05/23/2016 Document Revised: 06/25/2016 Document Reviewed: 05/23/2016 Elsevier Interactive Patient Education  2017 [employer].

## 2024-04-01 ENCOUNTER — Ambulatory Visit: Payer: Medicare Other | Admitting: Physician Assistant

## 2024-04-14 ENCOUNTER — Emergency Department (HOSPITAL_COMMUNITY)

## 2024-04-14 ENCOUNTER — Emergency Department (HOSPITAL_COMMUNITY)
Admission: EM | Admit: 2024-04-14 | Discharge: 2024-04-14 | Disposition: A | Discharge status: Home / Self Care | Attending: Emergency Medicine | Admitting: Emergency Medicine

## 2024-04-14 ENCOUNTER — Encounter (HOSPITAL_COMMUNITY): Payer: Self-pay

## 2024-04-14 ENCOUNTER — Other Ambulatory Visit: Payer: Self-pay

## 2024-04-14 DIAGNOSIS — W010XXA Fall on same level from slipping, tripping and stumbling without subsequent striking against object, initial encounter: Secondary | ICD-10-CM | POA: Diagnosis not present

## 2024-04-14 DIAGNOSIS — S0101XA Laceration without foreign body of scalp, initial encounter: Secondary | ICD-10-CM | POA: Insufficient documentation

## 2024-04-14 DIAGNOSIS — Z7982 Long term (current) use of aspirin: Secondary | ICD-10-CM | POA: Insufficient documentation

## 2024-04-14 DIAGNOSIS — Y92009 Unspecified place in unspecified non-institutional (private) residence as the place of occurrence of the external cause: Secondary | ICD-10-CM | POA: Insufficient documentation

## 2024-04-14 DIAGNOSIS — S0003XA Contusion of scalp, initial encounter: Secondary | ICD-10-CM

## 2024-04-14 LAB — CBG MONITORING, ED: Glucose-Capillary: 73 mg/dL (ref 70–99)

## 2024-04-14 NOTE — ED Triage Notes (Signed)
 Patient BIB [facility] from home for mechanical fall after tripping on a rug. Blood noted on scene and hematoma on right side of head but lac is difficult to locate d/t blood in hair.  BP 190/104 HR 62 98% RA CBG 85 RR 16

## 2024-04-14 NOTE — ED Provider Notes (Signed)
 [place] [facility] AT [facility] Provider Note   CSN: [id number] Arrival date & time: 04/14/24  1551     History  Chief Complaint  Patient presents with   Celise Bazar is a 89 y.o. female with PMH as listed below who presents BIB [facility] from home for mechanical fall after tripping on a rug. Otherwise was in her NSOH. Denies pain anywhere except the right side of her head. Hematoma and small lac noted to R temporal scalp. Last Tdap in 2023.   Past Medical History:  Diagnosis Date   Arthritis    GERD (gastroesophageal reflux disease)    Hyperlipidemia    Polymyalgia rheumatica (HCC)    Thyroid  nodule        Home Medications Prior to Admission medications   Medication Sig Start Date End Date Taking? Authorizing Provider  aspirin 81 MG tablet Take 81 mg by mouth daily with breakfast.     [provider]  atorvastatin  (LIPITOR) 10 MG tablet Take 10 mg by mouth daily.    [provider]  calcium -vitamin D (OSCAL WITH D) 500-200 MG-UNIT tablet Take 1 tablet by mouth daily with breakfast.    [provider]  divalproex  (DEPAKOTE ) 125 MG DR tablet Take two tabs at night 03/31/24   Dina, Sara E, PA-C  donepezil  (ARICEPT ) 10 MG tablet Take one tablet daily 03/31/24   Wertman, Sara E, PA-C  gabapentin  (NEURONTIN ) 300 MG capsule Take 300 mg by mouth 2 (two) times daily.  06/10/11   [provider]  levothyroxine  (SYNTHROID ) 112 MCG tablet Take 112 mcg by mouth daily. 12/19/21   [provider]  memantine  (NAMENDA ) 5 MG tablet Take 1 tablet (5 mg total) by mouth at bedtime. 03/31/24   Wertman, Sara E, PA-C  rOPINIRole  (REQUIP ) 0.5 MG tablet Take 0.5 mg by mouth 2 (two) times daily. 01/02/22   [provider]  rOPINIRole  (REQUIP ) 1 MG tablet Take 1 mg by mouth at bedtime. 05/17/11   [provider]  telmisartan (MICARDIS) 40 MG tablet Take 40 mg by mouth daily.    [provider]       Allergies    Levofloxacin and Statins    Review of Systems   Review of Systems A 10 point review of systems was performed and is negative unless otherwise reported in HPI.  Physical Exam Updated Vital Signs BP (!) 149/82   Pulse (!) 58   Temp 97.6 F (36.4 C) (Oral)   Resp 12   Ht 5' 1 (1.549 m)   Wt 68 kg   SpO2 99%   BMI 28.34 kg/m  Physical Exam General: Normal appearing female, lying in bed.  HEENT: 3 x 3 cm hematoma to the right temporal scalp with a 0.5 cm overlying laceration.  PERRLA, EOMI sclera anicteric, MMM, trachea midline.  No trauma to forehead, nasal bridge, midface, lips tongue or teeth.  No midline C-spine tenderness to palpation Cardiology: RRR, no murmurs/rubs/gallops.  No chest wall tenderness to palpation Resp: Normal respiratory rate and effort. CTAB, no wheezes, rhonchi, crackles.  Abd: Soft, non-tender, non-distended. No rebound tenderness or guarding.  GU: Deferred. MSK: No peripheral edema or signs of trauma. Extremities without deformity or TTP. No cyanosis or clubbing. Skin: warm, dry. Neuro: A&Ox4, CNs II-XII grossly intact. MAEs. Sensation grossly intact.  Psych: Normal mood and affect.   ED Results / Procedures / Treatments   Labs (all labs ordered are listed, but only  abnormal results are displayed) Labs Reviewed  CBG MONITORING, ED    EKG EKG Interpretation Date/Time:  Wednesday April 14 2024 83:73:75 EDT Ventricular Rate:  58 PR Interval:  229 QRS Duration:  91 QT Interval:  453 QTC Calculation: 445 R Axis:   -47  Text Interpretation: Sinus rhythm Prolonged PR interval LAD, consider left anterior fascicular block Low voltage, precordial leads Confirmed by Franklyn Gills [phone]) on 04/14/2024 5:45:45 PM  Radiology CT Head Wo Contrast Result Date: 04/14/2024 CLINICAL DATA:  Head trauma, minor (Age >= 65y); Neck trauma (Age >= 65y) EXAM: CT HEAD WITHOUT CONTRAST CT CERVICAL SPINE WITHOUT CONTRAST TECHNIQUE: Multidetector CT imaging  of the head and cervical spine was performed following the standard protocol without intravenous contrast. Multiplanar CT image reconstructions of the cervical spine were also generated. RADIATION DOSE REDUCTION: This exam was performed according to the departmental dose-optimization program which includes automated exposure control, adjustment of the mA and/or kV according to patient size and/or use of iterative reconstruction technique. COMPARISON:  None Available. FINDINGS: CT HEAD FINDINGS Brain: Cerebral ventricle sizes are concordant with the degree of cerebral volume loss. Patchy and confluent areas of decreased attenuation are noted throughout the deep and periventricular white matter of the cerebral hemispheres bilaterally, compatible with chronic microvascular ischemic disease. No evidence of large-territorial acute infarction. No parenchymal hemorrhage. No mass lesion. No extra-axial collection. No mass effect or midline shift. No hydrocephalus. Basilar cisterns are patent. Vascular: No hyperdense vessel. Atherosclerotic calcifications are present within the cavernous internal carotid arteries. Skull: No acute fracture or focal lesion. Sinuses/Orbits: Paranasal sinuses and mastoid air cells are clear. Bilateral lens replacement. Otherwise the orbits are unremarkable. Other: None. CT CERVICAL SPINE FINDINGS Alignment: Normal. Skull base and vertebrae: Multilevel moderate severe degenerative changes of the spine most prominent at the C3-C4 and C5-C6 levels. No associated moderate to severe right C5-C6 osseous neural foraminal stenosis. No associated severe osseous central canal stenosis. No acute fracture. No aggressive appearing focal osseous lesion or focal pathologic process. Soft tissues and spinal canal: No prevertebral fluid or swelling. No visible canal hematoma. Upper chest: Unremarkable. Other: Atherosclerotic plaque of the aortic arch and its main branches. IMPRESSION: 1. No acute intracranial  abnormality. 2. No acute displaced fracture or traumatic listhesis of the cervical spine. 3.  Aortic Atherosclerosis (ICD10-I70.0). Electronically Signed   By: Morgane  Naveau M.D.   On: 04/14/2024 17:27   CT Cervical Spine Wo Contrast Result Date: 04/14/2024 CLINICAL DATA:  Head trauma, minor (Age >= 65y); Neck trauma (Age >= 65y) EXAM: CT HEAD WITHOUT CONTRAST CT CERVICAL SPINE WITHOUT CONTRAST TECHNIQUE: Multidetector CT imaging of the head and cervical spine was performed following the standard protocol without intravenous contrast. Multiplanar CT image reconstructions of the cervical spine were also generated. RADIATION DOSE REDUCTION: This exam was performed according to the departmental dose-optimization program which includes automated exposure control, adjustment of the mA and/or kV according to patient size and/or use of iterative reconstruction technique. COMPARISON:  None Available. FINDINGS: CT HEAD FINDINGS Brain: Cerebral ventricle sizes are concordant with the degree of cerebral volume loss. Patchy and confluent areas of decreased attenuation are noted throughout the deep and periventricular white matter of the cerebral hemispheres bilaterally, compatible with chronic microvascular ischemic disease. No evidence of large-territorial acute infarction. No parenchymal hemorrhage. No mass lesion. No extra-axial collection. No mass effect or midline shift. No hydrocephalus. Basilar cisterns are patent. Vascular: No hyperdense vessel. Atherosclerotic calcifications are present within the cavernous internal carotid arteries. Skull: No  acute fracture or focal lesion. Sinuses/Orbits: Paranasal sinuses and mastoid air cells are clear. Bilateral lens replacement. Otherwise the orbits are unremarkable. Other: None. CT CERVICAL SPINE FINDINGS Alignment: Normal. Skull base and vertebrae: Multilevel moderate severe degenerative changes of the spine most prominent at the C3-C4 and C5-C6 levels. No associated  moderate to severe right C5-C6 osseous neural foraminal stenosis. No associated severe osseous central canal stenosis. No acute fracture. No aggressive appearing focal osseous lesion or focal pathologic process. Soft tissues and spinal canal: No prevertebral fluid or swelling. No visible canal hematoma. Upper chest: Unremarkable. Other: Atherosclerotic plaque of the aortic arch and its main branches. IMPRESSION: 1. No acute intracranial abnormality. 2. No acute displaced fracture or traumatic listhesis of the cervical spine. 3.  Aortic Atherosclerosis (ICD10-I70.0). Electronically Signed   By: Morgane  Naveau M.D.   On: 04/14/2024 17:27    Procedures .Laceration Repair  Date/Time: 04/14/2024 10:24 PM  Performed by: Franklyn Sid SAILOR, MD Authorized by: Franklyn Sid SAILOR, MD   Consent:    Consent obtained:  Verbal   Consent given by:  Patient   Risks discussed:  Infection, need for additional repair, poor cosmetic result, pain and vascular damage   Alternatives discussed:  No treatment Universal protocol:    Patient identity confirmed:  Verbally with patient Anesthesia:    Anesthesia method:  None Laceration details:    Location:  Scalp   Scalp location:  R temporal   Length (cm):  0.5   Depth (mm):  3 Pre-procedure details:    Preparation:  Imaging obtained to evaluate for foreign bodies Exploration:    Hemostasis achieved with:  Direct pressure   Imaging outcome: foreign body not noted     Wound exploration: wound explored through full range of motion and entire depth of wound visualized     Wound extent: areolar tissue not violated, fascia not violated, no foreign body, no signs of injury, no nerve damage, no tendon damage, no underlying fracture and no vascular damage     Contaminated: no   Treatment:    Area cleansed with:  Saline Skin repair:    Repair method:  Staples   Number of staples:  1 Approximation:    Approximation:  Close Repair type:    Repair type:   Simple Post-procedure details:    Dressing:  Open (no dressing)   Procedure completion:  Tolerated well, no immediate complications     Medications Ordered in ED Medications - No data to display  ED Course/ Medical Decision Making/ A&P                          Medical Decision Making Amount and/or Complexity of Data Reviewed Radiology: ordered. Decision-making details documented in ED Course.    MDM:    Patient with fall and minor head trauma.  She is hematoma with an overlying 0.5 cm laceration with intermittent minimal venous oozing.  Discussed with the patient and the daughter and will closed with a single staple.  Discussed that the staple will need to be removed in 1 week by healthcare professional.  Discussed risks including infection.  Patient's CT head and neck imaging is very reassuring and patient otherwise feels well.  Her glucose and EKG are unremarkable.  Her Tdap is up-to-date.  Advised Tylenol  for headache and pain.  Clinical Course as of 04/14/24 2224  Wed Apr 14, 2024  1801 CT Head Wo Contrast 1. No acute intracranial abnormality. 2. No  acute displaced fracture or traumatic listhesis of the cervical spine. 3.  Aortic Atherosclerosis (ICD10-I70.0).   [HN]    Clinical Course User Index [HN] Franklyn Sid SAILOR, MD    Labs: I Ordered, and personally interpreted labs.  The pertinent results include: Glucose within normal limits  Imaging Studies ordered: I ordered imaging studies including CT head, C-spine I independently visualized and interpreted imaging. I agree with the radiologist interpretation  Additional history obtained from chart review, daughter at bedside.   Reevaluation: After the interventions noted above, I reevaluated the patient and found that they have :improved  Social Determinants of Health:  lives independently  Disposition:  DC w/ discharge instructions/return precautions. All questions answered to patient's satisfaction.    Co  morbidities that complicate the patient evaluation  Past Medical History:  Diagnosis Date   Arthritis    GERD (gastroesophageal reflux disease)    Hyperlipidemia    Polymyalgia rheumatica (HCC)    Thyroid  nodule      Medicines No orders of the defined types were placed in this encounter.   I have reviewed the patients home medicines and have made adjustments as needed  Problem List / ED Course: Problem List Items Addressed This Visit   None Visit Diagnoses       Fall in home, initial encounter    -  Primary     Laceration of scalp, initial encounter         Scalp hematoma, initial encounter                       This note was created using dictation software, which may contain spelling or grammatical errors.    Franklyn Sid SAILOR, MD 04/14/24 2227

## 2024-04-14 NOTE — Discharge Instructions (Signed)
 Thank you for coming to [facility] Emergency Department. You were seen for fall. We did an exam, labs, and imaging, and these showed a very small scalp laceration and hematoma. We repaired the scalp laceration with a single staple. Please have this removed in 7 days by a healthcare professional. Please keep the area clean and dry. You can wash your hair like normal but do not scrub on the staple. You can take 1,000 mg of tylenol  every 8 hours for pain.   Please follow up with your primary care provider within 1 week.   Do not hesitate to return to the ED or call 911 if you experience: -Worsening symptoms -Lightheadedness, passing out -Fevers/chills -Anything else that concerns you

## 2024-05-27 ENCOUNTER — Other Ambulatory Visit: Payer: Self-pay | Admitting: Physician Assistant

## 2024-09-29 NOTE — Progress Notes (Incomplete)
 [name] is a very pleasant 89 y.o. RH female with a history of hypertension, hypothyroidism, RLS, seen today in follow up for memory loss. Patient is currently on donepezil  10 mg daily and unable to  tolerate memantine  5 mg nightly.  For sundowning he is on Depakote  250 mg. This patient is accompanied in the office by her daughter*** who supplements the history.  Previous records as well as any outside records available were reviewed prior to todays visit. Patient was last seen on 03/31/2024***. Memory is **. MMSE today is  /30. Patient is able to participate on ADLs and continues to drive without difficulties. 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  History of Present Illness     Any changes in memory since last visit? About the same.  Daughter reports that it may be slightly worse, both short-term and long-term. With short-term memory, he has issues with recent conversations and names of people, including that of her daughter.  Sometimes she confuses family members when her daughter is a caregiver.  If she sleeps well she has a better day  repeats oneself?  Endorsed Disoriented when walking into a room? She may get confused with rooms    Leaving objects?  Endorsed by her daughter   Wandering behavior?  denies   Any personality changes since last visit?  As before, he has moments of irritability. Any worsening depression?:  Denies.   Hallucinations or paranoia?  She sees people in her bedroom at night.  She hides things because she feels that someone will mess with her stuff.    Seizures? denies   Any sleep changes?  Sleeps better with Depakote , may wake up several times at night because the people are in my room .  Denies vivid dreams, REM behavior or sleepwalking   Sleep apnea?   Denies.   Any hygiene concerns? Denies.  Independent of bathing and dressing?  Endorsed  Does the patient needs help with medications?  Daughter is in  charge, because otherwise she may forget   Who is in charge of the finances? Daughter is in charge     Any changes in appetite?  denies.  Drinks plenty water    Patient have trouble swallowing? Denies.   Does the patient cook? No Any headaches?   denies   Any vision changes? Denies    Chronic back pain  denies   Ambulates with difficulty? Denies.  She has not using a cane or walker.  Recent falls or head injuries? Denies.     Unilateral weakness, numbness or tingling? denies   Any tremors?  Denies   Any anosmia?  Denies   Any incontinence of urine?  Endorsed, mild urge incontinence, wears pads just in case.  Any bowel dysfunction?   Denies      Patient lives alone, daughter monitors Does the patient drive? No longer drives   MRI of the brain performed at [facility] on 06/26/2023 remarkable for remote lacunar infarcts within the left thalamus and bilateral basal ganglia, sequela of moderate chronic small vessel disease, parenchymal brain volume loss with slight disproportionate involvement of the parietal lobes.  Initial visit September 2024 How long did patient have memory difficulties?  For the last 6 months.  Patient has difficulty remembering new information, recent conversations and names of people.  Sometimes she confuses family members. repeats oneself?  Endorsed Disoriented when walking into a room?  Denies  Leaving objects in unusual places?  May lose her bag but not in unusual places.  Wandering behavior? Denies.   Any personality changes, or depression, anxiety?  Has moments of irritability. She may get panicky for ex:  if the cleaning lady does not come on a certain day-daughter says Hallucinations or paranoia?  She reports that has seen her husband. She hides stuff in case someone comes and takes her stuff. Seizures? Denies.    Any sleep changes? Does not sleep well, She had vivid dreams in the past, especially abut her dead husband, she has RLS and takes meds which help. Denies sleepwalking.   Sleep apnea? Denies.   Any hygiene concerns?  Denies.   Independent of bathing and dressing?  Needs assistance getting dressed.  Who is in charge of the medications?  Patient is in charge, denies missing any doses  Who is in charge of the finances?   is in charge     Any changes in appetite?   Denies.  She drinks plenty water.    Patient have trouble swallowing?  Denies.   Does the patient cook?  No   Any headaches?  Denies.   Chronic back pain?  Denies.   Ambulates with difficulty? Denies. Recent falls or head injuries? Not recently, she fell 2 times over the last 2 years. She hit her head with the bathtub but no LOC Vision changes? Denies. Stroke like symptoms?  Denies.   Any tremors?  Denies.   Any anosmia? For several years  Any incontinence of urine?uses pads just in case   Any bowel dysfunction? She has some constipation     Patient lives alone, her daughter monitors  History of heavy alcohol intake? Denies.   History of heavy tobacco use? Denies.   Family history of dementia?   Denies. Does patient drive?Yes, denies getting lost, she drives very short distances            No data to display            07/02/2023   12:00 PM  Montreal Cognitive Assessment   Visuospatial/ Executive (0/5) 0  Naming (0/3) 2  Attention: Read list of digits (0/2) 2  Attention: Read list of letters (0/1) 1  Attention: Serial 7 subtraction starting at 100 (0/3) 0  Language: Repeat phrase (0/2) 0  Language : Fluency (0/1) 0  Abstraction (0/2) 0  Delayed Recall (0/5) 0  Orientation (0/6) 6  Total 11  Adjusted  Score (based on education) 12      Objective:    Neurological Exam:    VITALS:  There were no vitals filed for this visit.  GEN:  The patient appears stated age and is in NAD. HEENT:  Normocephalic, atraumatic.   Neurological examination:  General: NAD, well-groomed, appears stated age. Orientation: The patient is alert.  Not oriented to person, place and date Cranial nerves: There is good facial symmetry, anxious appearing.The speech is fluent and clear. No aphasia or dysarthria. Fund of knowledge is appropriate. Recent and remote memory are impaired. Attention and concentration are reduced. Able to name objects and repeat phrases.  Hearing is intact to conversational tone. *** Sensation: Sensation is intact to light touch throughout  Motor: Strength is at least antigravity x4. DTR's 2/4 in UE/LE     Movement examination:  Tone: There is normal tone in the UE/LE Abnormal movements:  no tremor.  No myoclonus.  No asterixis.   Coordination:  There is no decremation with RAM's. Normal finger to nose  Gait and Station: The patient has no*** difficulty arising out of a deep-seated chair without the use of the hands. The patient's stride length is good, uses a cane.  Gait is cautious and narrow.    Thank you for allowing us  the opportunity to participate in the care of this nice patient. Please do not hesitate to contact us  for any questions or concerns.   Total time spent on today's visit was *** minutes dedicated to this patient today, preparing to see patient, examining the patient, ordering tests and/or medications and counseling the patient, documenting clinical information in the EHR or other health record, independently interpreting results and communicating results to the patient/family, discussing treatment and goals, answering patient's questions and coordinating care.  Cc:  McClanahan, Kyra, NP  Camie Sevin 09/29/2024 6:43 AM

## 2024-09-30 ENCOUNTER — Encounter: Payer: Self-pay | Admitting: Physician Assistant

## 2024-09-30 ENCOUNTER — Ambulatory Visit: Admitting: Physician Assistant

## 2024-09-30 VITALS — BP 138/54 | HR 69 | Resp 20 | Ht 61.0 in | Wt 149.0 lb

## 2024-09-30 DIAGNOSIS — G309 Alzheimer's disease, unspecified: Secondary | ICD-10-CM | POA: Diagnosis not present

## 2024-09-30 DIAGNOSIS — F0280 Dementia in other diseases classified elsewhere without behavioral disturbance: Secondary | ICD-10-CM | POA: Diagnosis not present

## 2024-09-30 NOTE — Patient Instructions (Signed)
 It was a pleasure to see you today at our office.   Recommendations:  Follow up in 6 months Continue  donepezil  10 mg daily   Depakote  Take  two tabs  at night  Use the walker!!!!    Counseling regarding caregiver distress, including caregiver depression, anxiety and issues regarding community resources, adult day care programs, adult living facilities, or memory care questions:  please contact your  Primary Doctor's Social Worker   Whom to call: Memory  decline, memory medications: Call our office [phone]    [email or web address]   RECOMMENDATIONS FOR ALL PATIENTS WITH MEMORY PROBLEMS: 1. Continue to exercise (Recommend 30 minutes of walking everyday, or 3 hours every week) 2. Increase social interactions - continue going to [place] and enjoy social gatherings with friends and family 3. Eat healthy, avoid fried foods and eat more fruits and vegetables 4. Maintain adequate blood pressure, blood sugar, and blood cholesterol level. Reducing the risk of stroke and cardiovascular disease also helps promoting better memory. 5. Avoid stressful situations. Live a simple life and avoid aggravations. Organize your time and prepare for the next day in anticipation. 6. Sleep well, avoid any interruptions of sleep and avoid any distractions in the bedroom that may interfere with adequate sleep quality 7. Avoid sugar, avoid sweets as there is a strong link between excessive sugar intake, diabetes, and cognitive impairment We discussed the Mediterranean diet, which has been shown to help patients reduce the risk of progressive memory disorders and reduces cardiovascular risk. This includes eating fish, eat fruits and green leafy vegetables, nuts like almonds and hazelnuts, walnuts, and also use olive oil. Avoid fast foods and fried foods as much as possible. Avoid sweets and sugar as sugar use has been linked to worsening of memory  function.  There is always a concern of gradual progression of memory problems. If this is the case, then we may need to adjust level of care according to patient needs. Support, both to the patient and caregiver, should then be put into place.      FALL PRECAUTIONS: Be cautious when walking. Scan the area for obstacles that may increase the risk of trips and falls. When getting up in the mornings, sit up at the edge of the bed for a few minutes before getting out of bed. Consider elevating the bed at the head end to avoid drop of blood pressure when getting up. Walk always in a well-lit room (use night lights in the walls). Avoid area rugs or power cords from appliances in the middle of the walkways. Use a walker or a cane if necessary and consider physical therapy for balance exercise. Get your eyesight checked regularly.  FINANCIAL OVERSIGHT: Supervision, especially oversight when making financial decisions or transactions is also recommended.  HOME SAFETY: Consider the safety of the kitchen when operating appliances like stoves, microwave oven, and blender. Consider having supervision and share cooking responsibilities until no longer able to participate in those. Accidents with firearms and other hazards in the house should be identified and addressed as well.   ABILITY TO BE LEFT ALONE: If patient is unable to contact 911 operator, consider using LifeLine, or when the need is there, arrange for someone to stay with patients. Smoking is a fire hazard, consider supervision or cessation. Risk of wandering should be assessed by caregiver and if detected at any point, supervision and safe proof recommendations should be instituted.  MEDICATION SUPERVISION: Inability to self-administer medication needs to be constantly addressed. Implement a mechanism to  ensure safe administration of the medications.      Mediterranean Diet A Mediterranean diet refers to food and lifestyle choices that are  based on the traditions of countries located on the [place]. This way of eating has been shown to help prevent certain conditions and improve outcomes for people who have chronic diseases, like kidney disease and heart disease. What are tips for following this plan? Lifestyle  Cook and eat meals together with your family, when possible. Drink enough fluid to keep your urine clear or pale yellow. Be physically active every day. This includes: Aerobic exercise like running or swimming. Leisure activities like gardening, walking, or housework. Get 7-8 hours of sleep each night. If recommended by your health care provider, drink red wine in moderation. This means 1 glass a day for nonpregnant women and 2 glasses a day for men. A glass of wine equals 5 oz (150 mL). Reading food labels  Check the serving size of packaged foods. For foods such as rice and pasta, the serving size refers to the amount of cooked product, not dry. Check the total fat in packaged foods. Avoid foods that have saturated fat or trans fats. Check the ingredients list for added sugars, such as corn syrup. Shopping  At the grocery store, buy most of your food from the areas near the walls of the store. This includes: Fresh fruits and vegetables (produce). Grains, beans, nuts, and seeds. Some of these may be available in unpackaged forms or large amounts (in bulk). Fresh seafood. Poultry and eggs. Low-fat dairy products. Buy whole ingredients instead of prepackaged foods. Buy fresh fruits and vegetables in-season from local farmers markets. Buy frozen fruits and vegetables in resealable bags. If you do not have access to quality fresh seafood, buy precooked frozen shrimp or canned fish, such as tuna, salmon, or sardines. Buy small amounts of raw or cooked vegetables, salads, or olives from the deli or salad bar at your store. Stock your pantry so you always have certain foods on hand, such as olive oil, canned tuna,  canned tomatoes, rice, pasta, and beans. Cooking  Cook foods with extra-virgin olive oil instead of using butter or other vegetable oils. Have meat as a side dish, and have vegetables or grains as your main dish. This means having meat in small portions or adding small amounts of meat to foods like pasta or stew. Use beans or vegetables instead of meat in common dishes like chili or lasagna. Experiment with different cooking methods. Try roasting or broiling vegetables instead of steaming or sauteing them. Add frozen vegetables to soups, stews, pasta, or rice. Add nuts or seeds for added healthy fat at each meal. You can add these to yogurt, salads, or vegetable dishes. Marinate fish or vegetables using olive oil, lemon juice, garlic, and fresh herbs. Meal planning  Plan to eat 1 vegetarian meal one day each week. Try to work up to 2 vegetarian meals, if possible. Eat seafood 2 or more times a week. Have healthy snacks readily available, such as: Vegetable sticks with hummus. Greek yogurt. Fruit and nut trail mix. Eat balanced meals throughout the week. This includes: Fruit: 2-3 servings a day Vegetables: 4-5 servings a day Low-fat dairy: 2 servings a day Fish, poultry, or lean meat: 1 serving a day Beans and legumes: 2 or more servings a week Nuts and seeds: 1-2 servings a day Whole grains: 6-8 servings a day Extra-virgin olive oil: 3-4 servings a day Limit red meat and sweets to  only a few servings a month What are my food choices? Mediterranean diet Recommended Grains: Whole-grain pasta. Brown rice. Bulgar wheat. Polenta. Couscous. Whole-wheat bread. [place]. Vegetables: Artichokes. Beets. Broccoli. Cabbage. Carrots. Eggplant. Green beans. Chard. Kale. Spinach. Onions. Leeks. Peas. Squash. Tomatoes. Peppers. Radishes. Fruits: Apples. Apricots. Avocado. Berries. Bananas. Cherries. Dates. Figs. Grapes. Lemons. Melon. Oranges. Peaches. Plums. Pomegranate. Meats and other  protein foods: Beans. Almonds. Sunflower seeds. Pine nuts. Peanuts. Cod. Salmon. Scallops. Shrimp. Tuna. Tilapia. Clams. Oysters. Eggs. Dairy: Low-fat milk. Cheese. Greek yogurt. Beverages: Water. Red wine. Herbal tea. Fats and oils: Extra virgin olive oil. Avocado oil. Grape seed oil. Sweets and desserts: Greek yogurt with honey. Baked apples. Poached pears. Trail mix. Seasoning and other foods: Basil. Cilantro. Coriander. Cumin. Mint. Parsley. Sage. Rosemary. Tarragon. Garlic. Oregano. Thyme. Pepper. Balsalmic vinegar. Tahini. Hummus. Tomato sauce. Olives. Mushrooms. Limit these Grains: Prepackaged pasta or rice dishes. Prepackaged cereal with added sugar. Vegetables: Deep fried potatoes (french fries). Fruits: Fruit canned in syrup. Meats and other protein foods: Beef. Pork. Lamb. Poultry with skin. Hot dogs. [place]. Dairy: Ice cream. Sour cream. Whole milk. Beverages: Juice. Sugar-sweetened soft drinks. Beer. Liquor and spirits. Fats and oils: Butter. Canola oil. Vegetable oil. Beef fat (tallow). Lard. Sweets and desserts: Cookies. Cakes. Pies. Candy. Seasoning and other foods: Mayonnaise. Premade sauces and marinades. The items listed may not be a complete list. Talk with your dietitian about what dietary choices are right for you. Summary The Mediterranean diet includes both food and lifestyle choices. Eat a variety of fresh fruits and vegetables, beans, nuts, seeds, and whole grains. Limit the amount of red meat and sweets that you eat. Talk with your health care provider about whether it is safe for you to drink red wine in moderation. This means 1 glass a day for nonpregnant women and 2 glasses a day for men. A glass of wine equals 5 oz (150 mL). This information is not intended to replace advice given to you by your health care provider. Make sure you discuss any questions you have with your health care provider. Document Released: 05/23/2016 Document Revised: 06/25/2016 Document  Reviewed: 05/23/2016 Elsevier Interactive Patient Education  2017 [employer].

## 2024-10-01 ENCOUNTER — Inpatient Hospital Stay (HOSPITAL_COMMUNITY)
Admission: EM | Admit: 2024-10-01 | Discharge: 2024-10-08 | DRG: 689 | Disposition: A | Discharge status: Skilled Nursing Facility | Attending: Internal Medicine | Admitting: Internal Medicine

## 2024-10-01 ENCOUNTER — Emergency Department (HOSPITAL_COMMUNITY)

## 2024-10-01 ENCOUNTER — Other Ambulatory Visit: Payer: Self-pay

## 2024-10-01 DIAGNOSIS — D649 Anemia, unspecified: Secondary | ICD-10-CM | POA: Diagnosis present

## 2024-10-01 DIAGNOSIS — F0392 Unspecified dementia, unspecified severity, with psychotic disturbance: Secondary | ICD-10-CM | POA: Diagnosis present

## 2024-10-01 DIAGNOSIS — M353 Polymyalgia rheumatica: Secondary | ICD-10-CM | POA: Diagnosis present

## 2024-10-01 DIAGNOSIS — E785 Hyperlipidemia, unspecified: Secondary | ICD-10-CM | POA: Diagnosis present

## 2024-10-01 DIAGNOSIS — R55 Syncope and collapse: Principal | ICD-10-CM | POA: Diagnosis present

## 2024-10-01 DIAGNOSIS — G8929 Other chronic pain: Secondary | ICD-10-CM | POA: Diagnosis present

## 2024-10-01 DIAGNOSIS — G9341 Metabolic encephalopathy: Secondary | ICD-10-CM | POA: Diagnosis present

## 2024-10-01 DIAGNOSIS — Z808 Family history of malignant neoplasm of other organs or systems: Secondary | ICD-10-CM

## 2024-10-01 DIAGNOSIS — Z7982 Long term (current) use of aspirin: Secondary | ICD-10-CM

## 2024-10-01 DIAGNOSIS — E890 Postprocedural hypothyroidism: Secondary | ICD-10-CM | POA: Diagnosis present

## 2024-10-01 DIAGNOSIS — Z888 Allergy status to other drugs, medicaments and biological substances status: Secondary | ICD-10-CM | POA: Diagnosis not present

## 2024-10-01 DIAGNOSIS — Z881 Allergy status to other antibiotic agents status: Secondary | ICD-10-CM | POA: Diagnosis not present

## 2024-10-01 DIAGNOSIS — Z823 Family history of stroke: Secondary | ICD-10-CM | POA: Diagnosis not present

## 2024-10-01 DIAGNOSIS — Z8249 Family history of ischemic heart disease and other diseases of the circulatory system: Secondary | ICD-10-CM | POA: Diagnosis not present

## 2024-10-01 DIAGNOSIS — N3000 Acute cystitis without hematuria: Secondary | ICD-10-CM

## 2024-10-01 DIAGNOSIS — N390 Urinary tract infection, site not specified: Secondary | ICD-10-CM | POA: Diagnosis present

## 2024-10-01 DIAGNOSIS — Z79890 Hormone replacement therapy (postmenopausal): Secondary | ICD-10-CM

## 2024-10-01 DIAGNOSIS — I10 Essential (primary) hypertension: Secondary | ICD-10-CM | POA: Diagnosis present

## 2024-10-01 DIAGNOSIS — G2581 Restless legs syndrome: Secondary | ICD-10-CM | POA: Diagnosis present

## 2024-10-01 DIAGNOSIS — G629 Polyneuropathy, unspecified: Secondary | ICD-10-CM | POA: Diagnosis present

## 2024-10-01 DIAGNOSIS — Z79899 Other long term (current) drug therapy: Secondary | ICD-10-CM | POA: Diagnosis not present

## 2024-10-01 DIAGNOSIS — K219 Gastro-esophageal reflux disease without esophagitis: Secondary | ICD-10-CM | POA: Diagnosis present

## 2024-10-01 LAB — URINALYSIS, ROUTINE W REFLEX MICROSCOPIC
Bilirubin Urine: NEGATIVE
Glucose, UA: NEGATIVE mg/dL
Ketones, ur: NEGATIVE mg/dL
Nitrite: NEGATIVE
Protein, ur: NEGATIVE mg/dL
Specific Gravity, Urine: 1.02 (ref 1.005–1.030)
pH: 6.5 (ref 5.0–8.0)

## 2024-10-01 LAB — CBG MONITORING, ED: Glucose-Capillary: 94 mg/dL (ref 70–99)

## 2024-10-01 LAB — CBC
HCT: 35.9 % — ABNORMAL LOW (ref 36.0–46.0)
Hemoglobin: 11.3 g/dL — ABNORMAL LOW (ref 12.0–15.0)
MCH: 30.7 pg (ref 26.0–34.0)
MCHC: 31.5 g/dL (ref 30.0–36.0)
MCV: 97.6 fL (ref 80.0–100.0)
Platelets: 173 K/uL (ref 150–400)
RBC: 3.68 MIL/uL — ABNORMAL LOW (ref 3.87–5.11)
RDW: 12.7 % (ref 11.5–15.5)
WBC: 6.4 K/uL (ref 4.0–10.5)
nRBC: 0 % (ref 0.0–0.2)

## 2024-10-01 LAB — COMPREHENSIVE METABOLIC PANEL WITH GFR
ALT: 14 U/L (ref 0–44)
AST: 25 U/L (ref 15–41)
Albumin: 3.8 g/dL (ref 3.5–5.0)
Alkaline Phosphatase: 72 U/L (ref 38–126)
Anion gap: 10 (ref 5–15)
BUN: 29 mg/dL — ABNORMAL HIGH (ref 8–23)
CO2: 25 mmol/L (ref 22–32)
Calcium: 9 mg/dL (ref 8.9–10.3)
Chloride: 106 mmol/L (ref 98–111)
Creatinine, Ser: 0.84 mg/dL (ref 0.44–1.00)
GFR, Estimated: >60 mL/min
Glucose, Bld: 105 mg/dL — ABNORMAL HIGH (ref 70–99)
Potassium: 3.8 mmol/L (ref 3.5–5.1)
Sodium: 141 mmol/L (ref 135–145)
Total Bilirubin: 0.3 mg/dL (ref 0.0–1.2)
Total Protein: 6.3 g/dL — ABNORMAL LOW (ref 6.5–8.1)

## 2024-10-01 LAB — URINALYSIS, MICROSCOPIC (REFLEX)

## 2024-10-01 LAB — TROPONIN T, HIGH SENSITIVITY: Troponin T High Sensitivity: 21 ng/L — ABNORMAL HIGH (ref 0–19)

## 2024-10-01 MED ORDER — LACTATED RINGERS IV BOLUS
500.0000 mL | Freq: Once | INTRAVENOUS | Status: AC
  Administered 2024-10-01: 500 mL via INTRAVENOUS

## 2024-10-01 MED ORDER — SODIUM CHLORIDE 0.9 % IV SOLN
1.0000 g | Freq: Once | INTRAVENOUS | Status: AC
  Administered 2024-10-01: 1 g via INTRAVENOUS
  Filled 2024-10-01: qty 10

## 2024-10-01 NOTE — ED Provider Notes (Signed)
 " [facility] Provider Note   CSN: [id number] Arrival date & time: 10/01/24  1924     History  Chief Complaint  Patient presents with   [name] is a 89 y.o. female with PMH as listed below who presents with LOC and fall. BIB EMS, A&O x 3-4, sitting at kitchen table and had a syncopal event in which she fell to the floor and hit her head. Abrasion at center of the forehead, not on blood thinners. Doesn't recall any prodrome, CP, SOB.    Initial EMS vitals: 90 systolic, HR 40's   EMS vitals: 140/90  HR 60 SPO2 96% RA 18 RR CBG 143 .    Past Medical History:  Diagnosis Date   Arthritis    GERD (gastroesophageal reflux disease)    Hyperlipidemia    Polymyalgia rheumatica    Thyroid  nodule        Home Medications Prior to Admission medications  Medication Sig Start Date End Date Taking? Authorizing Provider  aspirin 81 MG tablet Take 81 mg by mouth daily with breakfast.     [provider]  atorvastatin  (LIPITOR) 10 MG tablet Take 10 mg by mouth daily.    [provider]  calcium -vitamin D (OSCAL WITH D) 500-200 MG-UNIT tablet Take 1 tablet by mouth daily with breakfast.    [provider]  divalproex  (DEPAKOTE ) 125 MG DR tablet Take two tabs at night 03/31/24   Dina, Sara E, PA-C  donepezil  (ARICEPT ) 10 MG tablet Take one tablet daily 03/31/24   Wertman, Sara E, PA-C  gabapentin  (NEURONTIN ) 300 MG capsule Take 300 mg by mouth 2 (two) times daily.  06/10/11   [provider]  levothyroxine  (SYNTHROID ) 112 MCG tablet Take 112 mcg by mouth daily. 12/19/21   [provider]  rOPINIRole  (REQUIP ) 0.5 MG tablet Take 0.5 mg by mouth 2 (two) times daily. 01/02/22   [provider]  rOPINIRole  (REQUIP ) 1 MG tablet Take 1 mg by mouth at bedtime. 05/17/11   [provider]  telmisartan (MICARDIS) 40 MG tablet Take 40 mg by mouth daily.    [provider]       Allergies    Levofloxacin and Statins    Review of Systems   Review of Systems A 10 point review of systems was performed and is negative unless otherwise reported in HPI.  Physical Exam Updated Vital Signs BP (!) 184/70   Pulse (!) 58   Temp (!) 97.5 F (36.4 C) (Oral)   Resp 17   Ht 5' 1 (1.549 m)   Wt 67.6 kg   SpO2 100%   BMI 28.15 kg/m  Physical Exam General: Elderly appearing female, lying in bed.  HEENT: PERRLA, EOMI, Sclera anicteric, MMM, trachea midline. NCAT except for an abrasion to her forehead. No lacerations or skull depressions. Cardiology: RRR, no murmurs/rubs/gallops. BL radial and DP pulses equal bilaterally.  Resp: Normal respiratory rate and effort. CTAB, no wheezes, rhonchi, crackles.  Abd: Soft, non-tender, non-distended. No rebound tenderness or guarding.  GU: Deferred. MSK: No peripheral edema or signs of trauma. Extremities without deformity or TTP. No cyanosis or clubbing. Skin: warm, dry.. Back: No CVA tenderness. No C/T/L spine TTP.  Neuro: A&Ox3-4 (intermittently confused to situaiton), CNs II-XII grossly intact. MAEs. Sensation grossly intact.  Psych: Normal mood and affect.   ED Results / Procedures / Treatments   Labs (all labs ordered are listed, but only  abnormal results are displayed) Labs Reviewed  COMPREHENSIVE METABOLIC PANEL WITH GFR - Abnormal; Notable for the following components:   Glucose, Bld 105 (*)    BUN 29 (*)    Total Protein 6.3 (*)    All other components within normal limits  CBC - Abnormal; Notable for the following components:   RBC 3.68 (*)    Hemoglobin 11.3 (*)    HCT 35.9 (*)    All other components within normal limits  URINALYSIS, ROUTINE W REFLEX MICROSCOPIC - Abnormal; Notable for the following components:   APPearance CLOUDY (*)    Hgb urine dipstick TRACE (*)    Leukocytes,Ua LARGE (*)    All other components within normal limits  URINALYSIS, MICROSCOPIC (REFLEX) - Abnormal; Notable for the  following components:   Bacteria, UA RARE (*)    All other components within normal limits  TROPONIN T, HIGH SENSITIVITY - Abnormal; Notable for the following components:   Troponin T High Sensitivity 21 (*)    All other components within normal limits  CBG MONITORING, ED    EKG EKG Interpretation Date/Time:  Saturday October 02 2024 02:15:22 EST Ventricular Rate:  82 PR Interval:  209 QRS Duration:  93 QT Interval:  399 QTC Calculation: 466 R Axis:   -2  Text Interpretation: Sinus rhythm Normal ECG When compared with ECG of 10/01/2024, No significant change was found Confirmed by Raford Lenis ([id number]) on 10/02/2024 5:14:27 AM  Radiology CTH: 1. No acute intracranial abnormality.  CT C-spine: No acute abnormality    Procedures Procedures    Medications Ordered in ED Medications  cefTRIAXone  (ROCEPHIN ) 1 g in sodium chloride  0.9 % 100 mL IVPB (0 g Intravenous Stopped 10/01/24 2339)  lactated ringers  bolus 500 mL (0 mLs Intravenous Stopped 10/01/24 2358)  haloperidol  lactate (HALDOL ) injection 5 mg (5 mg Intravenous Given 10/02/24 0153)  haloperidol  lactate (HALDOL ) injection 1 mg (1 mg Intravenous Given 10/07/24 0149)    ED Course/ Medical Decision Making/ A&P                          Medical Decision Making Amount and/or Complexity of Data Reviewed Labs: ordered. Decision-making details documented in ED Course. Radiology: ordered. Decision-making details documented in ED Course.  Risk Decision regarding hospitalization.    This patient presents to the ED for concern of fall, syncope, confusion?, this involves an extensive number of treatment options, and is a complaint that carries with it a high risk of complications and morbidity.  I considered the following differential and admission for this acute, potentially life threatening condition.   MDM:    DDX for trauma includes but is not limited to:  -Head Injury such as skull fx or ICH/c-spine - CTH and  C_spine reassuringly neg -Consider infection such as UTI, PNA - UA is ultimately positive for UTI. HDS, low c/f sepsis. -No significant electrolyte abnormalities or hyper/hypoglycemia -No resp distress or hypoxia -Hepatic encephalopathy or uremia -No CP or signs of ischemia on EKG to indicate ACS. No active arrhythmia but with syncope must consider an arrhythmia such as VT, atrial tachycardia, sinus pause/CHB. Will need telemetry monitoring. -Stable anemia    Clinical Course as of 10/07/24 1142  Fri Oct 01, 2024  2110 CT CERVICAL SPINE WO CONTRAST 1. No acute findings. [HN]  2110 CT Head Wo Contrast 1. No acute intracranial abnormality. [HN]  2218 Leukocytes,Ua(!): LARGE [HN]  2218 Urinalysis, Routine w reflex microscopic -Urine, Clean Catch(!) +UTI,  will give IV ceftriaxone  and get culture. Will admit in s/o Uti and syncope today. [HN]    Clinical Course User Index [HN] Franklyn Sid SAILOR, MD    Labs: I Ordered, and personally interpreted labs.  The pertinent results include:  those listed above  Imaging Studies ordered: I ordered imaging studies including CTH, CT C-spine I independently visualized and interpreted imaging. I agree with the radiologist interpretation  Additional history obtained from chart review.   Cardiac Monitoring: The patient was maintained on a cardiac monitor.  I personally viewed and interpreted the cardiac monitored which showed an underlying rhythm of: NSR  Reevaluation: After the interventions noted above, I reevaluated the patient and found that they have :stayed the same  Social Determinants of Health: Lives at facility  Disposition:  Admit to hospitalist  Co morbidities that complicate the patient evaluation  Past Medical History:  Diagnosis Date   Arthritis    GERD (gastroesophageal reflux disease)    Hyperlipidemia    Polymyalgia rheumatica    Thyroid  nodule      Medicines No orders of the defined types were placed in this  encounter.   I have reviewed the patients home medicines and have made adjustments as needed  Problem List / ED Course: Problem List Items Addressed This Visit       Genitourinary   * (Principal) Urinary tract infection   Other Visit Diagnoses       Syncope and collapse    -  Primary                   This note was created using dictation software, which may contain spelling or grammatical errors.    Franklyn Sid SAILOR, MD 10/07/24 1152  "

## 2024-10-01 NOTE — ED Triage Notes (Signed)
 89 yo female BIB EMS, a/o x 4, sitting at kitchen table and had a vagal event in which she fell to the floor and hit her head. Abrasion at center of the forehead, c-spine cleared and not on blood thinners.  20 G left AC   Initial EMS vitals: 90 systolic, HR 40's  EMS vitals: 140/90  HR 60 SPO2 96% RA 18 RR CBG 143

## 2024-10-02 ENCOUNTER — Encounter (HOSPITAL_COMMUNITY): Payer: Self-pay | Admitting: Internal Medicine

## 2024-10-02 DIAGNOSIS — N3000 Acute cystitis without hematuria: Secondary | ICD-10-CM

## 2024-10-02 LAB — URINE CULTURE

## 2024-10-02 MED ORDER — ROPINIROLE HCL 1 MG PO TABS
0.5000 mg | ORAL_TABLET | Freq: Two times a day (BID) | ORAL | Status: DC
  Administered 2024-10-02 – 2024-10-08 (×12): 0.5 mg via ORAL
  Filled 2024-10-02 (×14): qty 1

## 2024-10-02 MED ORDER — ACETAMINOPHEN 325 MG PO TABS
650.0000 mg | ORAL_TABLET | Freq: Four times a day (QID) | ORAL | Status: DC | PRN
  Administered 2024-10-03: 650 mg via ORAL
  Filled 2024-10-02: qty 2

## 2024-10-02 MED ORDER — DIVALPROEX SODIUM 125 MG PO DR TAB
125.0000 mg | DELAYED_RELEASE_TABLET | Freq: Every day | ORAL | Status: DC
  Administered 2024-10-02 – 2024-10-07 (×6): 125 mg via ORAL
  Filled 2024-10-02 (×9): qty 1

## 2024-10-02 MED ORDER — IRBESARTAN 75 MG PO TABS
150.0000 mg | ORAL_TABLET | Freq: Every day | ORAL | Status: DC
  Administered 2024-10-02 – 2024-10-08 (×7): 150 mg via ORAL
  Filled 2024-10-02 (×4): qty 2
  Filled 2024-10-02: qty 1
  Filled 2024-10-02 (×2): qty 2

## 2024-10-02 MED ORDER — SODIUM CHLORIDE 0.9 % IV SOLN
1.0000 g | INTRAVENOUS | Status: DC
  Administered 2024-10-02 – 2024-10-03 (×2): 1 g via INTRAVENOUS
  Filled 2024-10-02 (×2): qty 10

## 2024-10-02 MED ORDER — ATORVASTATIN CALCIUM 10 MG PO TABS
10.0000 mg | ORAL_TABLET | Freq: Every day | ORAL | Status: DC
  Administered 2024-10-03 – 2024-10-07 (×5): 10 mg via ORAL
  Filled 2024-10-02 (×5): qty 1

## 2024-10-02 MED ORDER — ENOXAPARIN SODIUM 40 MG/0.4ML IJ SOSY
40.0000 mg | PREFILLED_SYRINGE | INTRAMUSCULAR | Status: DC
  Administered 2024-10-02 – 2024-10-07 (×6): 40 mg via SUBCUTANEOUS
  Filled 2024-10-02 (×7): qty 0.4

## 2024-10-02 MED ORDER — GABAPENTIN 300 MG PO CAPS
300.0000 mg | ORAL_CAPSULE | Freq: Two times a day (BID) | ORAL | Status: DC
  Administered 2024-10-02 – 2024-10-08 (×13): 300 mg via ORAL
  Filled 2024-10-02 (×10): qty 1
  Filled 2024-10-02: qty 3
  Filled 2024-10-02 (×2): qty 1

## 2024-10-02 MED ORDER — ROPINIROLE HCL 1 MG PO TABS
1.0000 mg | ORAL_TABLET | Freq: Every day | ORAL | Status: DC
  Administered 2024-10-02 – 2024-10-07 (×6): 1 mg via ORAL
  Filled 2024-10-02 (×7): qty 1

## 2024-10-02 MED ORDER — QUETIAPINE FUMARATE 25 MG PO TABS
12.5000 mg | ORAL_TABLET | Freq: Every day | ORAL | Status: DC
  Administered 2024-10-02: 12.5 mg via ORAL
  Filled 2024-10-02: qty 1

## 2024-10-02 MED ORDER — HALOPERIDOL LACTATE 5 MG/ML IJ SOLN
5.0000 mg | Freq: Once | INTRAMUSCULAR | Status: AC
  Administered 2024-10-02: 5 mg via INTRAVENOUS
  Filled 2024-10-02: qty 1

## 2024-10-02 MED ORDER — ACETAMINOPHEN 650 MG RE SUPP: 650.0000 mg | Freq: Four times a day (QID) | RECTAL | Status: DC | PRN

## 2024-10-02 MED ORDER — ONDANSETRON HCL 4 MG PO TABS: 4.0000 mg | ORAL_TABLET | Freq: Four times a day (QID) | ORAL | Status: DC | PRN

## 2024-10-02 MED ORDER — ONDANSETRON HCL 4 MG/2ML IJ SOLN: 4.0000 mg | Freq: Four times a day (QID) | INTRAMUSCULAR | Status: DC | PRN

## 2024-10-02 MED ORDER — LEVOTHYROXINE SODIUM 112 MCG PO TABS
112.0000 ug | ORAL_TABLET | Freq: Every day | ORAL | Status: DC
  Administered 2024-10-02 – 2024-10-08 (×7): 112 ug via ORAL
  Filled 2024-10-02 (×7): qty 1

## 2024-10-02 MED ORDER — DONEPEZIL HCL 10 MG PO TABS
10.0000 mg | ORAL_TABLET | Freq: Every day | ORAL | Status: DC
  Administered 2024-10-02 – 2024-10-07 (×6): 10 mg via ORAL
  Filled 2024-10-02 (×6): qty 1

## 2024-10-02 NOTE — H&P (Signed)
 " History and Physical    [name] FMW:[id number] DOB: 1927/03/16 DOA: 10/01/2024  PCP: Wallie Drafts, NP   Chief Complaint: Confusion  HPI: [name] is a 89 y.o. female with medical history significant of GERD, hyperlipidemia, polymyalgia rheumatica, hypothyroidism, RLS who presented to the emergency department due to 2 syncope.  Patient has been confused and reportedly fell to the floor and hit her head at her living facility.  She was found to have abrasion was brought to the ER for further assessment.  On arrival she was hypertensive with systolics 180s and hemodynamically stable.  Labs were obtained on presentation which demonstrated CMP unrevealing, hemoglobin at baseline, urinalysis concerning for infection, troponin 21.  CT cervical spine was obtained which showed no acute abnormalities.  CT head showed no acute fractures.  Patient was noted for treatment of her UTI and further management.   Review of Systems: Review of Systems  Unable to perform ROS: Dementia  Constitutional: Negative.   HENT: Negative.    Eyes: Negative.   Respiratory: Negative.    Cardiovascular: Negative.   Skin: Negative.   All other systems reviewed and are negative.    As per HPI otherwise 10 point review of systems negative.   Allergies[1]  Past Medical History:  Diagnosis Date   Arthritis    GERD (gastroesophageal reflux disease)    Hyperlipidemia    Polymyalgia rheumatica    Thyroid  nodule     Past Surgical History:  Procedure Laterality Date   ABDOMINAL HYSTERECTOMY  1978   APPENDECTOMY  1948   BACK SURGERY     x2   CATARACT EXTRACTION, BILATERAL  03-12-12   bilateral   CHOLECYSTECTOMY  1966   THYROID  LOBECTOMY  03/17/2012   Procedure: THYROID  LOBECTOMY;  Surgeon: Morene ONEIDA Olives, MD;  Location: [facility];  Service: General;  Laterality: Right;   THYROIDECTOMY  03/17/2012   Procedure: THYROIDECTOMY;  Surgeon: Morene ONEIDA Olives, MD;  Location: [facility];  Service: General;   Laterality: N/A;     reports that she has never smoked. She does not have any smokeless tobacco history on file. She reports that she does not drink alcohol and does not use drugs.  Family History  Problem Relation Age of Onset   Heart disease Mother        heart attack   Stroke Father    Cancer Sister        Thyroid     Prior to Admission medications  Medication Sig Start Date End Date Taking? Authorizing Provider  atorvastatin  (LIPITOR) 10 MG tablet Take 10 mg by mouth daily.   Yes [provider]  calcium -vitamin D (OSCAL WITH D) 500-200 MG-UNIT tablet Take 1 tablet by mouth daily with breakfast.   Yes [provider]  divalproex  (DEPAKOTE ) 125 MG DR tablet Take two tabs at night 03/31/24  Yes Wertman, Sara E, PA-C  donepezil  (ARICEPT ) 10 MG tablet Take one tablet daily 03/31/24  Yes Wertman, Sara E, PA-C  gabapentin  (NEURONTIN ) 300 MG capsule Take 300 mg by mouth 2 (two) times daily.  06/10/11  Yes [provider]  levothyroxine  (SYNTHROID ) 112 MCG tablet Take 112 mcg by mouth daily. 12/19/21  Yes [provider]  rOPINIRole  (REQUIP ) 0.5 MG tablet Take 0.5 mg by mouth 2 (two) times daily. 01/02/22  Yes [provider]  rOPINIRole  (REQUIP ) 1 MG tablet Take 1 mg by mouth at bedtime. 05/17/11  Yes [provider]  telmisartan (MICARDIS) 40 MG tablet Take 40  mg by mouth daily.   Yes [provider]    Physical Exam: Vitals:   10/02/24 0024 10/02/24 0100 10/02/24 0208 10/02/24 0231  BP: 137/86 130/79 (!) 161/83   Pulse: 69 95 74 75  Resp: 13   20  Temp: 98.2 F (36.8 C)     TempSrc: Oral     SpO2: 99% 99% 100% 100%  Weight:      Height:       Physical Exam Vitals reviewed.  HENT:     Head: Normocephalic.     Nose: Nose normal.     Mouth/Throat:     Mouth: Mucous membranes are moist.     Pharynx: Oropharynx is clear.  Cardiovascular:     Rate and Rhythm: Normal rate and regular rhythm.  Pulmonary:     Effort:  Pulmonary effort is normal.     Breath sounds: Normal breath sounds.  Abdominal:     General: Abdomen is flat. Bowel sounds are normal.  Musculoskeletal:        General: Normal range of motion.     Cervical back: Normal range of motion.  Skin:    General: Skin is warm.  Neurological:     Mental Status: She is alert. She is disoriented.  Psychiatric:        Mood and Affect: Mood normal.        Labs on Admission: I have personally reviewed the patients's labs and imaging studies.  Assessment/Plan Principal Problem:   Urinary tract infection   # Acute encephalopathy most likely secondary to urinary tract infection - Patient presented confusion - Urinalysis with concern for infection - Disoriented on evaluation  Plan: Continue ceftriaxone  and send # Cognitive impairment-continue Depakote , Aricept   # Restless leg-continue Requip   # Hypothyroidism-continue Synthroid   # Chronic pain-continue gabapentin   # Hyperlipidemia-continue statin   Admission status: Inpatient Med-Surg  Certification: The appropriate patient status for this patient is INPATIENT. Inpatient status is judged to be reasonable and necessary in order to provide the required intensity of service to ensure the patient's safety. The patient's presenting symptoms, physical exam findings, and initial radiographic and laboratory data in the context of their chronic comorbidities is felt to place them at high risk for further clinical deterioration. Furthermore, it is not anticipated that the patient will be medically stable for discharge from the hospital within 2 midnights of admission.   * I certify that at the point of admission it is my clinical judgment that the patient will require inpatient hospital care spanning beyond 2 midnights from the point of admission due to high intensity of service, high risk for further deterioration and high frequency of surveillance required.DEWAINE Lamar Dess MD [facility] If 7PM-7AM, please contact night-coverage [email or web address]  10/02/2024, 3:02 AM        [1]  Allergies Allergen Reactions   Levofloxacin     REACTION: nausea   Statins     Unknown reaction   "

## 2024-10-02 NOTE — ED Notes (Addendum)
 This RN entered patient's room to find patient standing by the sink, naked, stating she needs to mop the floor and go home. Despite multiple attempts to redirect patient back into the bed, patient refuses. Patient refuses to put clothes on, states she has her own clothes in her suitcase that she will wear. Patient started becoming increasingly agitated, started verbally threatening this RN. Continued attempting to redirect patient back to bed, verbal deescalating techniques unsuccessful and patient continued getting more agitated. Patient then started attempting to hit this RN, patient also attempted to put her hands around this RN's neck stating she was going to end her. Attempts to call public safety were made. Lamar Dess, MD notified of patients behavior. Orders placed.

## 2024-10-02 NOTE — Progress Notes (Signed)
 "  PROGRESS NOTE    [name]  FMW:[id number] DOB: [birth year] DOA: 10/01/2024 PCP: Wallie Drafts, NP   Brief Narrative: [name] is a 89 y.o. female with a history of GERD, hyperlipidemia, polymyalgia rheumatica, hypothyroidism, restless leg syndrome.  Patient presented secondary to confusion with concern of UTI.  Patient started on IV antibiotics and urine culture obtained.   Assessment/Plan:  Acute metabolic encephalopathy Present on admission, presumed secondary to UTI. Complicated by underlying dementia. Improved since starting treatment for UTI.  UTI Urinalysis not overwhelmingly consistent with UTI. Urine culture obtained. Ceftriaxone  IV started empirically. -Continue ceftriaxone  IV -Follow-up urine culture  Restless leg syndrome -Continue  Requip   Hypothyroidism Prior to arrival medication(s) include levothyroxine . -Continue levothyroxine   Chronic pain Noted. -Continue gabapentin   Primary hypertension Prior to arrival medication(s) include telmisartan. -Continue irbesartan  (substituted for telmisartan)  Dementia Prior to arrival medication(s) include donepezil  and Depakote  -Continue donepezil  -Continue Depakote   Hyperlipidemia Prior to arrival medication(s) include atorvastatin . -Continue atorvastatin  10 mg daily    DVT prophylaxis: Lovenox  Code Status:   Code Status: Full Code Family Communication: Daughter and niece at bedside Disposition Plan: Discharge home likely in 24 hours once transitioned to oral antibiotics as needed   Consultants:  None  Procedures:  None  Antimicrobials: Ceftriaxone     Subjective: Patient feels well this morning. No concerns.  Objective: BP (!) 180/74 (BP Location: Right Arm)   Pulse 75   Temp 98.2 F (36.8 C) (Oral)   Resp 20   Ht 5' 1 (1.549 m)   Wt 67.6 kg   SpO2 98%   BMI 28.15 kg/m   Examination:  General exam: Appears calm and comfortable. Respiratory system: Clear to  auscultation. Respiratory effort normal. Cardiovascular system: S1 & S2 heard, RRR. 1/6 systolic murmur. Gastrointestinal system: Abdomen is nondistended, soft and nontender. Normal bowel sounds heard. Central nervous system: Alert and oriented to person. Musculoskeletal: No edema. No calf tenderness   Data Reviewed: I have personally reviewed following labs and imaging studies   Last CBC Lab Results  Component Value Date   WBC 6.4 10/01/2024   HGB 11.3 (L) 10/01/2024   HCT 35.9 (L) 10/01/2024   MCV 97.6 10/01/2024   MCH 30.7 10/01/2024   RDW 12.7 10/01/2024   PLT 173 10/01/2024     Last metabolic panel Lab Results  Component Value Date   GLUCOSE 105 (H) 10/01/2024   NA 141 10/01/2024   K 3.8 10/01/2024   CL 106 10/01/2024   CO2 25 10/01/2024   BUN 29 (H) 10/01/2024   CREATININE 0.84 10/01/2024   GFRNONAA >60 10/01/2024   CALCIUM  9.0 10/01/2024   PROT 6.3 (L) 10/01/2024   ALBUMIN 3.8 10/01/2024   BILITOT 0.3 10/01/2024   ALKPHOS 72 10/01/2024   AST 25 10/01/2024   ALT 14 10/01/2024   ANIONGAP 10 10/01/2024     Creatinine Clearance: Estimated Creatinine Clearance: 35.2 mL/min (by C-G formula based on SCr of 0.84 mg/dL).  No results found for this or any previous visit (from the past 240 hours).    Radiology Studies: CT CERVICAL SPINE WO CONTRAST Result Date: 10/01/2024 EXAM: CT CERVICAL SPINE WITHOUT CONTRAST 10/01/2024 08:18:00 PM TECHNIQUE: CT of the cervical spine was performed without the administration of intravenous contrast. Multiplanar reformatted images are provided for review. Automated exposure control, iterative reconstruction, and/or weight based adjustment of the mA/kV was utilized to reduce the radiation dose to as low as reasonably achievable. COMPARISON: 04/14/2024 prominent CLINICAL HISTORY: Neck trauma (Age >=  65y) FINDINGS: BONES AND ALIGNMENT: Anterolisthesis of C3. No acute fracture or traumatic malalignment. DEGENERATIVE CHANGES: Multilevel  spondylosis, disc space height loss, and degenerative endplate changes greatest at C5-C6 where it is advanced. Posterior disc osteophyte complex at C3-C4 and C5-C6 causing moderate thecal sac narrowing. Multilevel advanced facet arthropathy, greatest on the left at C3-C4. SOFT TISSUES: No prevertebral soft tissue swelling. IMPRESSION: 1. No acute findings. Electronically signed by: Norman Gatlin MD 10/01/2024 08:41 PM EST RP Workstation: [id number]   CT Head Wo Contrast Result Date: 10/01/2024 EXAM: CT HEAD WITHOUT CONTRAST 10/01/2024 08:18:00 PM TECHNIQUE: CT of the head was performed without the administration of intravenous contrast. Automated exposure control, iterative reconstruction, and/or weight based adjustment of the mA/kV was utilized to reduce the radiation dose to as low as reasonably achievable. COMPARISON: 04/14/2024 CLINICAL HISTORY: Head trauma, minor (Age >= 65y). FINDINGS: BRAIN AND VENTRICLES: No acute hemorrhage. No evidence of acute infarct. Proportional prominence of ventricles and sulci, consistent with diffuse cerebral parenchymal volume loss. Periventricular and subcortical white matter hypoattenuation, consistent with moderate chronic ischemic microvascular disease. Calcified atherosclerotic plaque within cavernous/supraclinoid internal carotid arteries. No hydrocephalus. No extra-axial collection. No mass effect or midline shift. ORBITS: Bilateral lens replacements. SINUSES: No acute abnormality. SOFT TISSUES AND SKULL: No skull fracture. IMPRESSION: 1. No acute intracranial abnormality. Electronically signed by: Norman Gatlin MD 10/01/2024 08:38 PM EST RP Workstation: [id number]      LOS: 1 day    Elgin Lam, MD [facility] 10/02/2024, 7:47 AM   If 7PM-7AM, please contact night-coverage [email or web address]  "

## 2024-10-02 NOTE — ED Notes (Signed)
 Pt found to be naked on side of bed agitated and wanting to get up and get out of bed. Pt trying to hit and grab at staff. MD made aware of pt agitation.

## 2024-10-02 NOTE — Plan of Care (Signed)

## 2024-10-02 NOTE — ED Notes (Signed)
 Patient cleaned, bed linens changed and patient provided with a new gown and new, warm blanket. Bed locked and in lowest position with call bell within reach.

## 2024-10-03 DIAGNOSIS — N3000 Acute cystitis without hematuria: Secondary | ICD-10-CM | POA: Diagnosis not present

## 2024-10-03 MED ORDER — ORAL CARE MOUTH RINSE: 15.0000 mL | OROMUCOSAL | Status: DC | PRN

## 2024-10-03 MED ORDER — QUETIAPINE FUMARATE 25 MG PO TABS
12.5000 mg | ORAL_TABLET | Freq: Every day | ORAL | Status: DC
  Administered 2024-10-03 – 2024-10-06 (×4): 12.5 mg via ORAL
  Filled 2024-10-03 (×4): qty 1

## 2024-10-03 NOTE — Plan of Care (Signed)

## 2024-10-03 NOTE — Progress Notes (Signed)
 "  PROGRESS NOTE    [name]  FMW:[id number] DOB: 04-Feb-1927 DOA: 10/01/2024 PCP: Wallie Drafts, NP   Brief Narrative: [name] is a 89 y.o. female with a history of GERD, hyperlipidemia, polymyalgia rheumatica, hypothyroidism, restless leg syndrome.  Patient presented secondary to confusion with concern of UTI.  Patient started on IV antibiotics and urine culture obtained. Urine culture with multiple species.   Assessment/Plan:  Acute metabolic encephalopathy Present on admission, presumed secondary to UTI. Complicated by underlying dementia. Improved since starting treatment for UTI.  UTI Urinalysis not overwhelmingly consistent with UTI. Urine culture obtained. Ceftriaxone  IV started empirically. Urine culture with multiple species. -Continue ceftriaxone  IV to complete a 3 day course of antibiotics.  Restless leg syndrome -Continue Requip   Hypothyroidism Prior to arrival medication(s) include levothyroxine . -Continue levothyroxine   Chronic pain Noted. -Continue gabapentin   Primary hypertension Prior to arrival medication(s) include telmisartan. -Continue irbesartan  (substituted for telmisartan)  Dementia Prior to arrival medication(s) include donepezil  and Depakote  -Continue donepezil  -Continue Depakote  -Seroquel  at bedtime while inpatient  Hyperlipidemia Prior to arrival medication(s) include atorvastatin . -Continue atorvastatin  10 mg daily   DVT prophylaxis: Lovenox  Code Status:   Code Status: Full Code Family Communication: Daughter and niece at bedside Disposition Plan: Discharge to SNF pending bed availability and insurance authorization. Medically stable likely in the next 24 hours   Consultants:  None  Procedures:  None  Antimicrobials: Ceftriaxone  IV   Subjective: Patient without concerns this morning. Tired, however.  Objective: BP 99/76 (BP Location: Left Arm)   Pulse (!) 58   Temp 97.6 F (36.4 C) (Oral)   Resp 19    Ht 5' 1 (1.549 m)   Wt 67.6 kg   SpO2 95%   BMI 28.15 kg/m   Examination:  General exam: Appears calm and comfortable. Respiratory system: Clear to auscultation. Respiratory effort normal. Cardiovascular system: S1 & S2 heard, RRR. Gastrointestinal system: Abdomen is nondistended, soft and nontender. Normal bowel sounds heard. Central nervous system: Alert and oriented to person.   Data Reviewed: I have personally reviewed following labs and imaging studies   Last CBC Lab Results  Component Value Date   WBC 6.4 10/01/2024   HGB 11.3 (L) 10/01/2024   HCT 35.9 (L) 10/01/2024   MCV 97.6 10/01/2024   MCH 30.7 10/01/2024   RDW 12.7 10/01/2024   PLT 173 10/01/2024     Last metabolic panel Lab Results  Component Value Date   GLUCOSE 105 (H) 10/01/2024   NA 141 10/01/2024   K 3.8 10/01/2024   CL 106 10/01/2024   CO2 25 10/01/2024   BUN 29 (H) 10/01/2024   CREATININE 0.84 10/01/2024   GFRNONAA >60 10/01/2024   CALCIUM  9.0 10/01/2024   PROT 6.3 (L) 10/01/2024   ALBUMIN 3.8 10/01/2024   BILITOT 0.3 10/01/2024   ALKPHOS 72 10/01/2024   AST 25 10/01/2024   ALT 14 10/01/2024   ANIONGAP 10 10/01/2024     Creatinine Clearance: Estimated Creatinine Clearance: 35.2 mL/min (by C-G formula based on SCr of 0.84 mg/dL).  Recent Results (from the past 240 hours)  Urine Culture     Status: Abnormal   Collection Time: 10/01/24  9:57 PM   Specimen: Urine, Clean Catch  Result Value Ref Range Status   Specimen Description URINE, CLEAN CATCH  Final   Special Requests   Final    NONE Performed at [facility], [address] [place], [place] [address]    Culture MULTIPLE SPECIES PRESENT, SUGGEST  RECOLLECTION (A)  Final   Report Status 10/02/2024 FINAL  Final      Radiology Studies: CT CERVICAL SPINE WO CONTRAST Result Date: 10/01/2024 EXAM: CT CERVICAL SPINE WITHOUT CONTRAST 10/01/2024 08:18:00 PM TECHNIQUE: CT of the cervical spine was performed without the  administration of intravenous contrast. Multiplanar reformatted images are provided for review. Automated exposure control, iterative reconstruction, and/or weight based adjustment of the mA/kV was utilized to reduce the radiation dose to as low as reasonably achievable. COMPARISON: 04/14/2024 prominent CLINICAL HISTORY: Neck trauma (Age >= 65y) FINDINGS: BONES AND ALIGNMENT: Anterolisthesis of C3. No acute fracture or traumatic malalignment. DEGENERATIVE CHANGES: Multilevel spondylosis, disc space height loss, and degenerative endplate changes greatest at C5-C6 where it is advanced. Posterior disc osteophyte complex at C3-C4 and C5-C6 causing moderate thecal sac narrowing. Multilevel advanced facet arthropathy, greatest on the left at C3-C4. SOFT TISSUES: No prevertebral soft tissue swelling. IMPRESSION: 1. No acute findings. Electronically signed by: Norman Gatlin MD 10/01/2024 08:41 PM EST RP Workstation: [id number]   CT Head Wo Contrast Result Date: 10/01/2024 EXAM: CT HEAD WITHOUT CONTRAST 10/01/2024 08:18:00 PM TECHNIQUE: CT of the head was performed without the administration of intravenous contrast. Automated exposure control, iterative reconstruction, and/or weight based adjustment of the mA/kV was utilized to reduce the radiation dose to as low as reasonably achievable. COMPARISON: 04/14/2024 CLINICAL HISTORY: Head trauma, minor (Age >= 65y). FINDINGS: BRAIN AND VENTRICLES: No acute hemorrhage. No evidence of acute infarct. Proportional prominence of ventricles and sulci, consistent with diffuse cerebral parenchymal volume loss. Periventricular and subcortical white matter hypoattenuation, consistent with moderate chronic ischemic microvascular disease. Calcified atherosclerotic plaque within cavernous/supraclinoid internal carotid arteries. No hydrocephalus. No extra-axial collection. No mass effect or midline shift. ORBITS: Bilateral lens replacements. SINUSES: No acute abnormality. SOFT TISSUES AND  SKULL: No skull fracture. IMPRESSION: 1. No acute intracranial abnormality. Electronically signed by: Norman Gatlin MD 10/01/2024 08:38 PM EST RP Workstation: [id number]      LOS: 2 days    Gina Lam, MD [facility] 10/03/2024, 10:03 AM   If 7PM-7AM, please contact night-coverage [email or web address]  "

## 2024-10-03 NOTE — Evaluation (Signed)
 Physical Therapy Evaluation Patient Details Name: [name] MRN: [id number] DOB: 1927-07-14 Today's Date: 10/03/2024  History of Present Illness  89 y.o. female presents 10/02/24 to [facility] due to syncope with head strike. Pt presented secondary to confusion with concern of UTI.  PMH: GERD, HLD, polymyalgia rheumatica, hypothyroidism, dementia  Clinical Impression  PTA pt living alone with daughter supervision in mornings and aide afternoon and evening. Daughter reports independence with mobility and most ADLs, aide assists with iADLs. Pt is currently limited in safe mobility by decreased cognition especially with sequencing and command follow. Pt is supervision for bed mobility and modA for sit to stand transfers. Pt exhibits poor safety awareness with RW and increased posterior lean in standing. Patient will benefit from continued inpatient follow up therapy, <3 hours/day. Daughter requests [place]. PT will continue to follow acutely and will refer to Mobility Specialist.          If plan is discharge home, recommend the following: A lot of help with walking and/or transfers;A lot of help with bathing/dressing/bathroom;Assistance with cooking/housework;Direct supervision/assist for medications management;Direct supervision/assist for financial management;Assist for transportation;Help with stairs or ramp for entrance;Supervision due to cognitive status   Can travel by private vehicle   No    Equipment Recommendations None recommended by PT     Functional Status Assessment Patient has had a recent decline in their functional status and demonstrates the ability to make significant improvements in function in a reasonable and predictable amount of time.     Precautions / Restrictions Precautions Precautions: Fall Recall of Precautions/Restrictions: Impaired Restrictions Weight Bearing Restrictions Per Provider Order: No      Mobility  Bed Mobility Overal bed mobility: Needs  Assistance Bed Mobility: Supine to Sit     Supine to sit: HOB elevated, Used rails, Supervision     General bed mobility comments: increased time and effort but eventually, able to come to EoB and get feet to floor    Transfers Overall transfer level: Needs assistance Equipment used: Rolling walker (2 wheels) Transfers: Sit to/from Stand, Bed to chair/wheelchair/BSC Sit to Stand: Mod assist           General transfer comment: pt requires maximal cuing and placement of hands in correct positioning to power up to RW, increased posterior lean and once in standing pt reports needing to pee, pts daughter reports she has Purewick, PT offered to get [facility] as it might be confusing to ask her to pee when it is possible to get to [facility], NT also reports pt has Purewick and its fine given her increased urination, PT went to get [facility], however no bed pan present. pt reports she does not think she can hold it, PT assisted pt to standing and daughter encouraged pt to urinate, Purewick initially worked then failed and urine all down pt legs, floor and socks. Pt required 4x STS for cleaning before clean, NT wanted to place mesh panties, with power up to RW and increased bracing on EoB pt able to reach down to pull up panties, before stting back on bed, left with NT changing gown and socks.           Balance Overall balance assessment: Needs assistance Sitting-balance support: Bilateral upper extremity supported, Feet supported Sitting balance-Leahy Scale: Poor Sitting balance - Comments: Requires BUE support Postural control: Posterior lean Standing balance support: Bilateral upper extremity supported, During functional activity, Reliant on assistive device for balance Standing balance-Leahy Scale: Poor Standing balance comment: Dependent on RW and  external support                             Pertinent Vitals/Pain Pain Assessment Pain Assessment: No/denies pain      Extremity/Trunk  Assessment   Upper Extremity Assessment Upper Extremity Assessment: Defer to OT evaluation    Lower Extremity Assessment Lower Extremity Assessment: Generalized weakness    Cervical / Trunk Assessment Cervical / Trunk Assessment: Kyphotic  Communication   Communication Communication: No apparent difficulties    Cognition Arousal: Alert Behavior During Therapy: WFL for tasks assessed/performed   PT - Cognitive impairments: History of cognitive impairments, Orientation, Initiation, Sequencing, Problem solving, Safety/Judgement   Orientation impairments: Time, Situation                   PT - Cognition Comments: disoriented to time and situation, poor sequencing and command follow. Following commands: Impaired Following commands impaired: Follows one step commands inconsistently, Follows one step commands with increased time     Cueing Cueing Techniques: Verbal cues, Tactile cues, Visual cues     General Comments General comments (skin integrity, edema, etc.): daughter present throughout, requesting [place], providing clarification to pt answers about home set up andPLOF        Assessment/Plan    PT Assessment Patient needs continued PT services  PT Problem List Decreased strength;Decreased activity tolerance;Decreased balance;Decreased mobility;Decreased coordination;Decreased cognition;Decreased knowledge of use of DME;Decreased safety awareness       PT Treatment Interventions DME instruction;Gait training;Stair training;Functional mobility training;Therapeutic activities;Therapeutic exercise;Balance training;Cognitive remediation;Patient/family education    PT Goals (Current goals can be found in the Care Plan section)  Acute Rehab PT Goals PT Goal Formulation: With patient/family Time For Goal Achievement: 10/17/24 Potential to Achieve Goals: Fair    Frequency Min 2X/week        AM-PAC PT 6 Clicks Mobility  Outcome Measure Help needed turning  from your back to your side while in a flat bed without using bedrails?: None Help needed moving from lying on your back to sitting on the side of a flat bed without using bedrails?: None Help needed moving to and from a bed to a chair (including a wheelchair)?: A Lot Help needed standing up from a chair using your arms (e.g., wheelchair or bedside chair)?: A Lot Help needed to walk in hospital room?: A Lot Help needed climbing 3-5 steps with a railing? : Total 6 Click Score: 15    End of Session Equipment Utilized During Treatment: Gait belt Activity Tolerance: Patient tolerated treatment well Patient left: in bed;Other (comment) (sitting EoB with sitter changing gown and socks) Nurse Communication: Mobility status PT Visit Diagnosis: Unsteadiness on feet (R26.81);Other abnormalities of gait and mobility (R26.89);Muscle weakness (generalized) (M62.81);History of falling (Z91.81);Difficulty in walking, not elsewhere classified (R26.2);Other symptoms and signs involving the nervous system (R29.898)    Time: 8389-8364 PT Time Calculation (min) (ACUTE ONLY): 25 min   Charges:   PT Evaluation $PT Eval Moderate Complexity: 1 Mod PT Treatments $Therapeutic Activity: 8-22 mins PT General Charges $$ ACUTE PT VISIT: 1 Visit         Jovannie Ulibarri B. Fleeta Lapidus PT, DPT Acute Rehabilitation Services Please use secure chat or  Call Office [phone]   Almarie KATHEE Fleeta [facility] 10/03/2024, 5:16 PM

## 2024-10-03 NOTE — Evaluation (Signed)
 Occupational Therapy Evaluation Patient Details Name: [name] MRN: [id number] DOB: Jun 24, 1927 Today's Date: 10/03/2024   History of Present Illness   89 y.o. female presents 10/02/24 to [facility] due to syncope with head strike. Pt presented secondary to confusion with concern of UTI.  PMH: GERD, HLD, polymyalgia rheumatica, hypothyroidism, dementia     Clinical Impressions PTA Pt was independent with functional transfers and ADL tasks at home with sitter present for safety. Pt currently requires up to total A for ADL engagement and up to Mod A for functional mobility OOB. Pt primarily limited by generalized weakness, unsteadiness on feet, decreased activity tolerance, decreased safety awareness and impaired cognition. OT to continue to follow Pt acutely to facilitate progress towards goals. Patient will benefit from continued inpatient follow up therapy, <3 hours/day with goal of safe return home.      If plan is discharge home, recommend the following:   A lot of help with walking and/or transfers;A lot of help with bathing/dressing/bathroom;Assistance with cooking/housework;Assistance with feeding;Direct supervision/assist for medications management;Direct supervision/assist for financial management;Assist for transportation;Help with stairs or ramp for entrance;Supervision due to cognitive status     Functional Status Assessment   Patient has had a recent decline in their functional status and demonstrates the ability to make significant improvements in function in a reasonable and predictable amount of time.     Equipment Recommendations   Other (comment) (defer)     Recommendations for Other Services         Precautions/Restrictions   Precautions Precautions: Fall Recall of Precautions/Restrictions: Impaired Restrictions Weight Bearing Restrictions Per Provider Order: No     Mobility Bed Mobility Overal bed mobility: Needs Assistance Bed Mobility: Supine  to Sit     Supine to sit: Min assist, HOB elevated, Used rails     General bed mobility comments: Min HHA to come to EOB on L side. Verbal cues to scoot towards edge. Strong posterior lean    Transfers Overall transfer level: Needs assistance Equipment used: Rolling walker (2 wheels) Transfers: Sit to/from Stand, Bed to chair/wheelchair/BSC Sit to Stand: Mod assist     Step pivot transfers: Mod assist     General transfer comment: Mod A to rise from bed. Explicit multimodal cues for proper hand placement on RW. Strong posterior lean in standing and small base of support. Cues to widen stance with no improvement in lean. Mod A step pivot to recliner. Increased management of RW and dense verbal cues to sequence steps. Three cues required to keep hands on RW. Occassional moments of gestural cues to facilitate trunk extension. Decreased control of descent.      Balance Overall balance assessment: Needs assistance Sitting-balance support: Bilateral upper extremity supported, Feet supported Sitting balance-Leahy Scale: Poor Sitting balance - Comments: Requires BUE support Postural control: Posterior lean Standing balance support: Bilateral upper extremity supported, During functional activity, Reliant on assistive device for balance Standing balance-Leahy Scale: Poor Standing balance comment: Dependent on RW and external support                           ADL either performed or assessed with clinical judgement   ADL Overall ADL's : Needs assistance/impaired Eating/Feeding: Moderate assistance Eating/Feeding Details (indicate cue type and reason): Daughter feeding Pt upon entry Grooming: Moderate assistance;Sitting   Upper Body Bathing: Moderate assistance;Sitting   Lower Body Bathing: Moderate assistance   Upper Body Dressing : Moderate assistance   Lower Body Dressing: Moderate  assistance Lower Body Dressing Details (indicate cue type and reason): Mod A don socks.  Not able to balance sitting EOB for dynamic tasks Toilet Transfer: Moderate assistance;Rolling walker (2 wheels);Stand-pivot;BSC/3in1   Toileting- Architect and Hygiene: Total assistance               Vision Patient Visual Report: No change from baseline       Perception         Praxis         Pertinent Vitals/Pain Pain Assessment Pain Assessment: No/denies pain     Extremity/Trunk Assessment Upper Extremity Assessment Upper Extremity Assessment: Generalized weakness   Lower Extremity Assessment Lower Extremity Assessment: Defer to PT evaluation   Cervical / Trunk Assessment Cervical / Trunk Assessment: Kyphotic   Communication Communication Communication: No apparent difficulties   Cognition Arousal: Alert Behavior During Therapy: WFL for tasks assessed/performed Cognition: History of cognitive impairments             OT - Cognition Comments: baseline dementia                 Following commands: Impaired Following commands impaired: Follows one step commands inconsistently, Follows one step commands with increased time     Cueing  General Comments   Cueing Techniques: Verbal cues;Tactile cues;Visual cues  Daughter present for session, actively engaged with Pt.   Exercises     Shoulder Instructions      Home Living Family/patient expects to be discharged to:: Private residence Living Arrangements: Alone (Daughter there from 8am-12 and then aid would arrive at 1pm-7pm.) Available Help at Discharge: Family;Other (Comment);Available PRN/intermittently (HH aid through affordable care) Type of Home: House Home Access: Stairs to enter [employer] of Steps: 3 Entrance Stairs-Rails: None Home Layout: One level     Bathroom Shower/Tub: Sponge bathes at baseline   Bathroom Toilet: Handicapped height Bathroom Accessibility: Yes How Accessible: Accessible via walker Home Equipment:  (need to obtain info)           Prior Functioning/Environment Prior Level of Function : Independent/Modified Independent;Needs assist             Mobility Comments: Pt daughter present for session reports Pt did not use AD for mobility. Supervision from her or sitter ADLs Comments: Per daughter report, Pt was able to complete BADLs with supervision from sitter.    OT Problem List: Decreased strength;Decreased activity tolerance;Impaired balance (sitting and/or standing);Decreased cognition;Decreased safety awareness;Decreased knowledge of use of DME or AE   OT Treatment/Interventions: Self-care/ADL training;Therapeutic exercise;Energy conservation;DME and/or AE instruction;Therapeutic activities;Patient/family education;Balance training      OT Goals(Current goals can be found in the care plan section)   Acute Rehab OT Goals Patient Stated Goal: to get better OT Goal Formulation: With patient/family Time For Goal Achievement: 10/17/24 Potential to Achieve Goals: Good ADL Goals Pt Will Perform Eating: with set-up;sitting Pt Will Perform Grooming: with set-up;sitting Pt Will Perform Upper Body Dressing: with contact guard assist;sitting Pt Will Perform Lower Body Dressing: with min assist;sitting/lateral leans Pt Will Transfer to Toilet: with contact guard assist;ambulating;bedside commode   OT Frequency:  Min 2X/week    Co-evaluation              AM-PAC OT 6 Clicks Daily Activity     Outcome Measure Help from another person eating meals?: A Lot Help from another person taking care of personal grooming?: A Lot Help from another person toileting, which includes using toliet, bedpan, or urinal?: Total Help from another person bathing (  including washing, rinsing, drying)?: A Lot Help from another person to put on and taking off regular upper body clothing?: A Lot Help from another person to put on and taking off regular lower body clothing?: A Lot 6 Click Score: 11   End of Session Equipment  Utilized During Treatment: Gait belt;Rolling walker (2 wheels) Nurse Communication: Mobility status  Activity Tolerance: Patient tolerated treatment well Patient left: in chair;with call bell/phone within reach;with chair alarm set;with nursing/sitter in room;with family/visitor present  OT Visit Diagnosis: Unsteadiness on feet (R26.81);Muscle weakness (generalized) (M62.81);History of falling (Z91.81);Other symptoms and signs involving cognitive function                Time: 9043-8968 OT Time Calculation (min): 35 min Charges:  OT General Charges $OT Visit: 1 Visit OT Evaluation $OT Eval Low Complexity: 1 Low OT Treatments $Therapeutic Activity: 8-22 mins  Maurilio CROME, OTR/L.  [facility] Acute Rehabilitation  Office: [phone]   Maurilio PARAS Fama Muenchow 10/03/2024, 11:51 AM

## 2024-10-04 DIAGNOSIS — N3000 Acute cystitis without hematuria: Secondary | ICD-10-CM | POA: Diagnosis not present

## 2024-10-04 NOTE — Progress Notes (Signed)
 Occupational Therapy Treatment Patient Details Name: [name] MRN: [id number] DOB: 1927/10/13 Today's Date: 10/04/2024   History of present illness 89 y.o. female presents 10/02/24 to [facility] due to syncope with head strike. Pt presented secondary to confusion with concern of UTI.  PMH: GERD, HLD, polymyalgia rheumatica, hypothyroidism, dementia   OT comments  Patient with nice progression to patient focused goals.  Able to walk to the bathroom for toileting.  While on the bedside commode over the toilet, patient able to was face with setup, wash upper body with Min A, and wash lower body via sitter with Mod to near Max A sit to stand.  OT will continue efforts in the acute setting.  Patient will benefit from continued inpatient follow up therapy, <3 hours/day.      If plan is discharge home, recommend the following:  A lot of help with walking and/or transfers;A lot of help with bathing/dressing/bathroom;Assistance with cooking/housework;Assistance with feeding;Direct supervision/assist for medications management;Direct supervision/assist for financial management;Assist for transportation;Help with stairs or ramp for entrance;Supervision due to cognitive status   Equipment Recommendations       Recommendations for Other Services      Precautions / Restrictions Precautions Precautions: Fall Recall of Precautions/Restrictions: Impaired Restrictions Weight Bearing Restrictions Per Provider Order: No       Mobility Bed Mobility Overal bed mobility: Needs Assistance Bed Mobility: Supine to Sit     Supine to sit: HOB elevated, Used rails, Supervision, Contact guard          Transfers Overall transfer level: Needs assistance Equipment used: Rolling walker (2 wheels) Transfers: Sit to/from Stand, Bed to chair/wheelchair/BSC Sit to Stand: Min assist     Step pivot transfers: Min assist, Mod assist           Balance Overall balance assessment: Needs assistance    Sitting balance-Leahy Scale: Fair   Postural control: Posterior lean Standing balance support: Bilateral upper extremity supported, During functional activity, Reliant on assistive device for balance Standing balance-Leahy Scale: Poor                             ADL either performed or assessed with clinical judgement   ADL       Grooming: Set up;Cueing for safety;Sitting   Upper Body Bathing: Minimal assistance;Sitting   Lower Body Bathing: Moderate assistance;Sit to/from stand   Upper Body Dressing : Minimal assistance;Sitting   Lower Body Dressing: Moderate assistance;Maximal assistance;Sit to/from stand   Toilet Transfer: Minimal assistance;Ambulation;Rolling walker (2 wheels);Regular Toilet   Toileting- Clothing Manipulation and Hygiene: Moderate assistance;Sit to/from stand         General ADL Comments: unsteady in standing, decreased safety awareness    Extremity/Trunk Assessment Upper Extremity Assessment Upper Extremity Assessment: Overall WFL for tasks assessed   Lower Extremity Assessment Lower Extremity Assessment: Defer to PT evaluation   Cervical / Trunk Assessment Cervical / Trunk Assessment: Kyphotic    Vision Patient Visual Report: No change from baseline     Perception Perception Perception: Not tested   Praxis Praxis Praxis: Not tested   Communication Communication Communication: No apparent difficulties   Cognition Arousal: Alert Behavior During Therapy: WFL for tasks assessed/performed Cognition: History of cognitive impairments             OT - Cognition Comments: baseline dementia                 Following commands: Impaired Following commands impaired: Follows  one step commands inconsistently, Follows one step commands with increased time      Cueing   Cueing Techniques: Verbal cues, Tactile cues, Visual cues  Exercises      Shoulder Instructions       General Comments  VSS    Pertinent  Vitals/ Pain       Pain Assessment Pain Assessment: No/denies pain                                                          Frequency  Min 2X/week        Progress Toward Goals  OT Goals(current goals can now be found in the care plan section)  Progress towards OT goals: Progressing toward goals  Acute Rehab OT Goals OT Goal Formulation: With patient Time For Goal Achievement: 10/17/24 Potential to Achieve Goals: Good  Plan      Co-evaluation                 AM-PAC OT 6 Clicks Daily Activity     Outcome Measure   Help from another person eating meals?: A Little Help from another person taking care of personal grooming?: A Little Help from another person toileting, which includes using toliet, bedpan, or urinal?: A Lot Help from another person bathing (including washing, rinsing, drying)?: A Lot Help from another person to put on and taking off regular upper body clothing?: A Little Help from another person to put on and taking off regular lower body clothing?: A Lot 6 Click Score: 15    End of Session Equipment Utilized During Treatment: Gait belt;Rolling walker (2 wheels)  OT Visit Diagnosis: Unsteadiness on feet (R26.81);Muscle weakness (generalized) (M62.81);History of falling (Z91.81);Other symptoms and signs involving cognitive function   Activity Tolerance Patient tolerated treatment well   Patient Left in chair;with call bell/phone within reach;with chair alarm set;with nursing/sitter in room   Nurse Communication Mobility status        Time: 8669-8645 OT Time Calculation (min): 24 min  Charges: OT General Charges $OT Visit: 1 Visit OT Treatments $Self Care/Home Management : 8-22 mins $Therapeutic Activity: 8-22 mins  10/04/2024  RP, OTR/L  Acute Rehabilitation Services  Office:  [phone]   [name] 10/04/2024, 1:58 PM

## 2024-10-04 NOTE — Progress Notes (Signed)
 "  PROGRESS NOTE    [name]  FMW:[id number] DOB: 10/07/1927 DOA: 10/01/2024 PCP: Wallie Drafts, NP   Brief Narrative: [name] is a 89 y.o. female with a history of GERD, hyperlipidemia, polymyalgia rheumatica, hypothyroidism, restless leg syndrome.  Patient presented secondary to confusion with concern of UTI.  Patient started on IV antibiotics and urine culture obtained. Urine culture with multiple species.   Assessment/Plan:  Acute metabolic encephalopathy Present on admission, presumed secondary to UTI. Complicated by underlying dementia. Improved since starting treatment for UTI.  UTI Urinalysis not overwhelmingly consistent with UTI. Urine culture obtained. Ceftriaxone  IV started empirically. Urine culture with multiple species. Patient completed three days of Ceftriaxone  IV.  Restless leg syndrome -Continue Requip   Hypothyroidism Prior to arrival medication(s) include levothyroxine . -Continue levothyroxine   Chronic pain Noted. -Continue gabapentin   Primary hypertension Prior to arrival medication(s) include telmisartan. -Continue irbesartan  (substituted for telmisartan)  Dementia Prior to arrival medication(s) include donepezil  and Depakote  -Continue donepezil  -Continue Depakote  -Seroquel  at bedtime while inpatient  Hyperlipidemia Prior to arrival medication(s) include atorvastatin . -Continue atorvastatin  10 mg daily   DVT prophylaxis: Lovenox  Code Status:   Code Status: Full Code Family Communication: Daughter on telephone Disposition Plan: Discharge to SNF pending bed availability and insurance authorization. Medically stable for discharge.   Consultants:  None  Procedures:  None  Antimicrobials: Ceftriaxone  IV   Subjective: No concerns this morning.  Objective: BP 137/81 (BP Location: Left Arm)   Pulse 73   Temp 97.6 F (36.4 C) (Oral)   Resp 18   Ht 5' 1 (1.549 m)   Wt 67.6 kg   SpO2 97%   BMI 28.15 kg/m    Examination:  General exam: Appears calm and comfortable. Respiratory system: Clear to auscultation. Respiratory effort normal. Cardiovascular system: S1 & S2 heard, RRR. Gastrointestinal system: Abdomen is nondistended, soft and nontender. Normal bowel sounds heard. Central nervous system: Alert and oriented to person and place.   Data Reviewed: I have personally reviewed following labs and imaging studies   Last CBC Lab Results  Component Value Date   WBC 6.4 10/01/2024   HGB 11.3 (L) 10/01/2024   HCT 35.9 (L) 10/01/2024   MCV 97.6 10/01/2024   MCH 30.7 10/01/2024   RDW 12.7 10/01/2024   PLT 173 10/01/2024     Last metabolic panel Lab Results  Component Value Date   GLUCOSE 105 (H) 10/01/2024   NA 141 10/01/2024   K 3.8 10/01/2024   CL 106 10/01/2024   CO2 25 10/01/2024   BUN 29 (H) 10/01/2024   CREATININE 0.84 10/01/2024   GFRNONAA >60 10/01/2024   CALCIUM  9.0 10/01/2024   PROT 6.3 (L) 10/01/2024   ALBUMIN 3.8 10/01/2024   BILITOT 0.3 10/01/2024   ALKPHOS 72 10/01/2024   AST 25 10/01/2024   ALT 14 10/01/2024   ANIONGAP 10 10/01/2024     Creatinine Clearance: Estimated Creatinine Clearance: 35.2 mL/min (by C-G formula based on SCr of 0.84 mg/dL).  Recent Results (from the past 240 hours)  Urine Culture     Status: Abnormal   Collection Time: 10/01/24  9:57 PM   Specimen: Urine, Clean Catch  Result Value Ref Range Status   Specimen Description URINE, CLEAN CATCH  Final   Special Requests   Final    NONE Performed at [facility], [address] [place], [place] [address]    Culture MULTIPLE SPECIES PRESENT, SUGGEST RECOLLECTION (A)  Final   Report Status 10/02/2024 FINAL  Final  Radiology Studies: No results found.     LOS: 3 days    Elgin Lam, MD [facility] 10/04/2024, 8:27 AM   If 7PM-7AM, please contact night-coverage [email or web address]  "

## 2024-10-04 NOTE — [place] FL2 (Signed)
 " [place]  MEDICAID FL2 LEVEL OF CARE FORM     IDENTIFICATION  Patient Name: [name] Birthdate: [birth year] Sex: female Admission Date (Current Location): 10/01/2024  [facility] and [place] Number:  Producer, Television/film/video and Address:  [facility], [address] [place], [place] [address]      Provider Number: [id number]  Attending Physician Name and Address:  Briana Elgin LABOR, MD  Relative Name and Phone Number:  Myers,Janet  Daughter, Emergency Contact  [phone]    Current Level of Care: Hospital Recommended Level of Care: Skilled Nursing Facility Prior Approval Number:    Date Approved/Denied:   PASRR Number: [id number]  Discharge Plan: SNF    Current Diagnoses: Patient Active Problem List   Diagnosis Date Noted   Urinary tract infection 10/01/2024   Orthostatic hypotension 02/10/2022   Acute posthemorrhagic anemia 02/10/2022   Closed fracture of fifth metatarsal bone of left foot 02/10/2022   Fall at home, initial encounter 02/10/2022   Hypothyroidism 02/10/2022   HTN (hypertension) 02/10/2022   Cancer of thyroid  (HCC) 04/14/2012   Anxiety state 05/01/2009   GERD 05/01/2009   CHEST PAIN 05/01/2009   HLD (hyperlipidemia) 04/19/2009   FATIQUE AND MALAISE 04/19/2009   PNEUMONIA 04/18/2009   Arthropathy 04/18/2009   Osteoporosis 04/18/2009   RESPIRATORY ARREST 04/18/2009   SYNCOPE, HX OF 04/18/2009    Orientation RESPIRATION BLADDER Height & Weight     Self  Normal Incontinent Weight: 149 lb (67.6 kg) Height:  5' 1 (154.9 cm)  BEHAVIORAL SYMPTOMS/MOOD NEUROLOGICAL BOWEL NUTRITION STATUS      Incontinent Diet (see dc summary)  AMBULATORY STATUS COMMUNICATION OF NEEDS Skin   Extensive Assist Verbally Other (Comment) (ecchymosis)                       Personal Care Assistance Level of Assistance  Bathing, Feeding, Dressing Bathing Assistance: Maximum assistance (moderate assist) Feeding assistance: Maximum  assistance (moderate assist) Dressing Assistance: Maximum assistance (moderate assist)     Functional Limitations Info  Sight, Hearing, Speech Sight Info: Adequate Hearing Info: Adequate Speech Info: Adequate    SPECIAL CARE FACTORS FREQUENCY  PT (By licensed PT), OT (By licensed OT)     PT Frequency: 5x/wk OT Frequency: 5x/wk            Contractures Contractures Info: Not present    Additional Factors Info  Code Status, Allergies Code Status Info: FULL Allergies Info: Levofloxacin  Statins           Current Medications (10/04/2024):  This is the current hospital active medication list Current Facility-Administered Medications  Medication Dose Route Frequency Provider Last Rate Last Admin   acetaminophen  (TYLENOL ) tablet 650 mg  650 mg Oral Q6H PRN Dena Charleston, MD   650 mg at 10/03/24 1031   Or   acetaminophen  (TYLENOL ) suppository 650 mg  650 mg Rectal Q6H PRN Dena Charleston, MD       atorvastatin  (LIPITOR) tablet 10 mg  10 mg Oral q1800 Dorrell, Robert, MD   10 mg at 10/03/24 1729   divalproex  (DEPAKOTE ) DR tablet 125 mg  125 mg Oral QHS Dorrell, Robert, MD   125 mg at 10/03/24 2223   donepezil  (ARICEPT ) tablet 10 mg  10 mg Oral QHS Dorrell, Robert, MD   10 mg at 10/03/24 2223   enoxaparin  (LOVENOX ) injection 40 mg  40 mg Subcutaneous Q24H Dena Charleston, MD   40 mg at 10/03/24 1518   gabapentin  (  NEURONTIN ) capsule 300 mg  300 mg Oral BID Dena Charleston, MD   300 mg at 10/04/24 9096   irbesartan  (AVAPRO ) tablet 150 mg  150 mg Oral Daily Dorrell, Robert, MD   150 mg at 10/04/24 9096   levothyroxine  (SYNTHROID ) tablet 112 mcg  112 mcg Oral Q0600 Dena Charleston, MD   112 mcg at 10/04/24 0550   ondansetron  (ZOFRAN ) tablet 4 mg  4 mg Oral Q6H PRN Dena Charleston, MD       Or   ondansetron  (ZOFRAN ) injection 4 mg  4 mg Intravenous Q6H PRN Dena Charleston, MD       Oral care mouth rinse  15 mL Mouth Rinse PRN Briana Elgin LABOR, MD       QUEtiapine  (SEROQUEL ) tablet  12.5 mg  12.5 mg Oral QHS Briana Elgin LABOR, MD   12.5 mg at 10/03/24 2223   rOPINIRole  (REQUIP ) tablet 0.5 mg  0.5 mg Oral BID Dena Charleston, MD   0.5 mg at 10/04/24 9096   rOPINIRole  (REQUIP ) tablet 1 mg  1 mg Oral QHS Dorrell, Robert, MD   1 mg at 10/03/24 2223     Discharge Medications: Please see discharge summary for a list of discharge medications.  Relevant Imaging Results:  Relevant Lab Results:   Additional Information SSN: [id number]  Lendia Dais, LCSWA     "

## 2024-10-04 NOTE — TOC Initial Note (Signed)
 Transition of Care [facility]) - Initial/Assessment Note    Patient Details  Name: [name] MRN: [id number] Date of Birth: [birth year]  Transition [facility]) CM/SW Contact:    Lendia Dais, LCSWA Phone Number: 10/04/2024, 12:59 PM  Clinical Narrative: CSW spoke to pt's daughter Clarita at bedside and introduced self and role. Pt is oriented to self only.  Pt is from home alone and contact is Clarita (daughter). Clarita states that the pt's PCP suggested DME but pt refuses to use it. Pt is indep w/ ADL's and Clarita drives the pt to her appointments.   Clarita reports that the pt has seen her PCP in the last year, has a CNA through affordable care services, and states that she is the financial POA. CSW inquired if they would like the info for HCPOA and Clarita was agreeable. Spiritual consult placed.  CSW spoke to pt about recs of SNF from PT. Clarita was agreeable. Clarita voiced concerns about dementia and stated that it was diagnosed through [facility]. CSW notified that there are facilities with a memory care side if the pt needs that accomodation. Clarita reported no signs of behavioral disturbance.   CSW sent out referrals in the HUB and pending bed offers. CSW left medicare.gov list w/ Clarita at bedside. Clarita left the CSW a VM stated preferences of [facility].  CSW will continue to monitor for bed offers.                  Expected Discharge Plan: Skilled Nursing Facility Barriers to Discharge: Continued Medical Work up   Patient Goals and CMS Choice Patient states their goals for this hospitalization and ongoing recovery are:: Pt oriented to self only CMS Medicare.gov Compare Post Acute Care list provided to:: Patient Represenative (must comment) (Daughter Clarita [facility])) Choice offered to / list presented to : Adult Children      Expected Discharge Plan and Services In-house Referral: Clinical Social Work     Living arrangements for the past 2 months:  Skilled Nursing Facility                                      Prior Living Arrangements/Services Living arrangements for the past 2 months: Skilled Nursing Facility Lives with:: Self Patient language and need for interpreter reviewed:: No Do you feel safe going back to the place where you live?:  (pt oriented to self only)      Need for Family Participation in Patient Care: Yes (Comment) Care giver support system in place?: Yes (comment) Current home services: Other (comment) (CNA) Criminal Activity/Legal Involvement Pertinent to Current Situation/Hospitalization: No - Comment as needed  Activities of Daily Living   ADL Screening (condition at time of admission) Independently performs ADLs?: Yes (appropriate for developmental age) Is the patient deaf or have difficulty hearing?: Yes Does the patient have difficulty seeing, even when wearing glasses/contacts?: No Does the patient have difficulty concentrating, remembering, or making decisions?: Yes  Permission Sought/Granted Permission sought to share information with : Facility Medical Sales Representative Permission granted to share information with : No  Share Information with NAME: Clarita     Permission granted to share info w Relationship: Daughter  Permission granted to share info w Contact Information: [phone]  Emotional Assessment Appearance:: Appears stated age Attitude/Demeanor/Rapport: Unable to Assess Affect (typically observed): Unable to Assess Orientation: : Oriented to Self Alcohol / Substance Use: Not  Applicable Psych Involvement: No (comment)  Admission diagnosis:  Syncope and collapse [R55] Urinary tract infection [N39.0] Acute cystitis without hematuria [N30.00] Patient Active Problem List   Diagnosis Date Noted   Urinary tract infection 10/01/2024   Orthostatic hypotension 02/10/2022   Acute posthemorrhagic anemia 02/10/2022   Closed fracture of fifth metatarsal bone of left foot 02/10/2022    Fall at home, initial encounter 02/10/2022   Hypothyroidism 02/10/2022   HTN (hypertension) 02/10/2022   Cancer of thyroid  (HCC) 04/14/2012   Anxiety state 05/01/2009   GERD 05/01/2009   CHEST PAIN 05/01/2009   HLD (hyperlipidemia) 04/19/2009   FATIQUE AND MALAISE 04/19/2009   PNEUMONIA 04/18/2009   Arthropathy 04/18/2009   Osteoporosis 04/18/2009   RESPIRATORY ARREST 04/18/2009   SYNCOPE, HX OF 04/18/2009   PCP:  Wallie Drafts, NP Pharmacy:   [phone] [place], [place] - [address] [place] [address] Phone: [phone] Fax: [phone]  [id number] [place], [place] - [address] AT [address]NER OF [facility] [address] [place] [address] Phone: [phone] Fax: [phone]     Social Drivers of Health (SDOH) Social History: SDOH Screenings   Food Insecurity: No Food Insecurity (10/02/2024)  Housing: Low Risk (10/02/2024)  Transportation Needs: No Transportation Needs (10/02/2024)  Utilities: Not At Risk (10/02/2024)  Financial Resource Strain: Low Risk (07/29/2024)   Received from [facility]  Physical Activity: Inactive (07/29/2024)   Received from [facility]  Social Connections: Socially Isolated (10/02/2024)  Stress: No Stress Concern Present (07/29/2024)   Received from [facility]  Tobacco Use: Unknown (10/02/2024)   SDOH Interventions: Housing Interventions: Intervention Not Indicated   Readmission Risk Interventions     No data to display

## 2024-10-05 DIAGNOSIS — N3000 Acute cystitis without hematuria: Secondary | ICD-10-CM | POA: Diagnosis not present

## 2024-10-05 NOTE — Progress Notes (Signed)
 Chaplain attempted to respond to [facility] consult for advance directives. Chaplain introduced spiritual care to pt and her daughter (and sitter at pt bedside). Family shared that another chaplain had just been in pt's room to discuss. No further needs.  Alan HERO. Davee Lomax, M.Div. [facility] Chaplain Pager [phone] Office [phone]    10/05/24 1050  Spiritual Encounters  Type of Visit Initial  Care provided to: Pt and family  Conversation partners present during encounter Other (comment)  Reason for visit Advance directives  OnCall Visit No

## 2024-10-05 NOTE — Progress Notes (Signed)
 Chaplain responded to spiritual care consult for advance directives.   Pt [name] greeted me but fell asleep soon after. Paperwork and information given to daughter [name], primarily as a english as a second language teacher. [name] shared that [name] has dementia advanced enough to where she wouldn't be able to complete the documents. All questions answered at this time.  Primarily, I provided emotional support (via compassionate presence, reflective listening, and prayer with her consent) to [name], who expressed internal struggles with taking on the role of primary decision-maker. Juliene owns a home, commercial property, and is undergoing health changes, all of which have to be considered. [name] stated she will meet with a representative from a long-term care facility today to discuss possible next steps.  Chaplains continue to remain available as further needs arise.

## 2024-10-05 NOTE — Progress Notes (Signed)
 Physical Therapy Treatment Patient Details Name: [name] MRN: [id number] DOB: [birth year] Today's Date: 10/05/2024   History of Present Illness 89 y.o. female presents 10/02/24 to [facility] due to syncope with head strike. Pt presented secondary to confusion with concern of UTI.  PMH: GERD, HLD, polymyalgia rheumatica, hypothyroidism, dementia    PT Comments  Pt had a good session this pm. Pt is only alert to self but pleasant and cooperative and follows one step commands.  Pt performed increased activity tolerance this session.  Continue to recommend rehab in a post acute setting to maximize functional gains and improve independence. Pt remains a high fall risk and at times forgetting to use the RW appropriately.      If plan is discharge home, recommend the following: A lot of help with walking and/or transfers;A lot of help with bathing/dressing/bathroom;Assistance with cooking/housework;Direct supervision/assist for medications management;Direct supervision/assist for financial management;Assist for transportation;Help with stairs or ramp for entrance;Supervision due to cognitive status   Can travel by private vehicle     No  Equipment Recommendations  None recommended by PT    Recommendations for Other Services       Precautions / Restrictions Precautions Precautions: Fall Recall of Precautions/Restrictions: Impaired Restrictions Weight Bearing Restrictions Per Provider Order: No     Mobility  Bed Mobility               General bed mobility comments: Seated in recliner on arrival.    Transfers Overall transfer level: Needs assistance Equipment used: Rolling walker (2 wheels) Transfers: Sit to/from Stand Sit to Stand: Contact guard assist           General transfer comment: Cues for hand placement to and from seated surface.  Performed from recliner and commode.  Pt able to perform hygiene after toileting.    Ambulation/Gait Ambulation/Gait assistance:  Contact guard assist Gait Distance (Feet): 120 Feet Assistive device: Rolling walker (2 wheels) Gait Pattern/deviations: Step-to pattern           Stairs             Wheelchair Mobility     Tilt Bed    Modified Rankin (Stroke Patients Only)       Balance Overall balance assessment: Needs assistance Sitting-balance support: Bilateral upper extremity supported, Feet supported Sitting balance-Leahy Scale: Fair Sitting balance - Comments: Requires BUE support Postural control: Posterior lean Standing balance support: Bilateral upper extremity supported, During functional activity, Reliant on assistive device for balance Standing balance-Leahy Scale: Poor Standing balance comment: Dependent on RW and external support                            Communication    Cognition Arousal: Alert Behavior During Therapy: WFL for tasks assessed/performed   PT - Cognitive impairments: History of cognitive impairments, Orientation, Initiation, Sequencing, Problem solving, Safety/Judgement                       PT - Cognition Comments: demetia at baseline- dtr reports she is more confused than normal.        Cueing    Exercises      General Comments        Pertinent Vitals/Pain Pain Assessment Pain Assessment: No/denies pain    Home Living                          Prior Function  PT Goals (current goals can now be found in the care plan section) Acute Rehab PT Goals Potential to Achieve Goals: Good Progress towards PT goals: Progressing toward goals    Frequency    Min 2X/week      PT Plan      Co-evaluation              AM-PAC PT 6 Clicks Mobility   Outcome Measure  Help needed turning from your back to your side while in a flat bed without using bedrails?: None Help needed moving from lying on your back to sitting on the side of a flat bed without using bedrails?: None Help needed moving to and  from a bed to a chair (including a wheelchair)?: A Little Help needed standing up from a chair using your arms (e.g., wheelchair or bedside chair)?: A Little Help needed to walk in hospital room?: A Little Help needed climbing 3-5 steps with a railing? : Total 6 Click Score: 18    End of Session Equipment Utilized During Treatment: Gait belt Activity Tolerance: Patient tolerated treatment well Patient left: in chair;with chair alarm set Nurse Communication: Mobility status PT Visit Diagnosis: Unsteadiness on feet (R26.81);Other abnormalities of gait and mobility (R26.89);Muscle weakness (generalized) (M62.81);History of falling (Z91.81);Difficulty in walking, not elsewhere classified (R26.2);Other symptoms and signs involving the nervous system (R29.898)     Time: 8448-8384 PT Time Calculation (min) (ACUTE ONLY): 24 min  Charges:    $Gait Training: 8-22 mins $Therapeutic Activity: 8-22 mins PT General Charges $$ ACUTE PT VISIT: 1 Visit                     Toya HAMS , PTA Acute Rehabilitation Services Office [phone]    Toya JINNY Gosling 10/05/2024, 5:58 PM

## 2024-10-05 NOTE — Plan of Care (Signed)
 Pt calm and cooperative.  Sitter at bedside this AM, then d/c'd.  Bed alarm on. Non-skid socks on.    Problem: Activity: Goal: Risk for activity intolerance will decrease Outcome: Progressing   Problem: Coping: Goal: Level of anxiety will decrease Outcome: Progressing   Problem: Safety: Goal: Ability to remain free from injury will improve Outcome: Progressing

## 2024-10-05 NOTE — Plan of Care (Signed)

## 2024-10-05 NOTE — Progress Notes (Signed)
 "  PROGRESS NOTE    [name]  FMW:[id number] DOB: August 05, 1927 DOA: 10/01/2024 PCP: Wallie Drafts, NP   Brief Narrative: [name] is a 89 y.o. female with a history of GERD, hyperlipidemia, polymyalgia rheumatica, hypothyroidism, restless leg syndrome.  Patient presented secondary to confusion with concern of UTI.  Patient started on IV antibiotics and urine culture obtained. Urine culture with multiple species. Patient completed 3 days of Ceftriaxone  IV.   Assessment/Plan:  Acute metabolic encephalopathy Present on admission, presumed secondary to UTI. Complicated by underlying dementia. Improved since starting treatment for UTI. -Will need to discontinue the sitter to allow her to qualify for discharge to SNF  UTI Urinalysis not overwhelmingly consistent with UTI. Urine culture obtained. Ceftriaxone  IV started empirically. Urine culture with multiple species. Patient completed three days of Ceftriaxone  IV.  Restless leg syndrome -Continue Requip   Hypothyroidism Prior to arrival medication(s) include levothyroxine . -Continue levothyroxine   Chronic pain Noted. -Continue gabapentin   Primary hypertension Prior to arrival medication(s) include telmisartan. -Continue irbesartan  (substituted for telmisartan)  Dementia Prior to arrival medication(s) include donepezil  and Depakote  -Continue donepezil  -Continue Depakote  -Seroquel  at bedtime while inpatient  Hyperlipidemia Prior to arrival medication(s) include atorvastatin . -Continue atorvastatin  10 mg daily   DVT prophylaxis: Lovenox  Code Status:   Code Status: Full Code Family Communication: None at bedside Disposition Plan: Discharge to SNF pending bed availability and insurance authorization. Medically stable for discharge.   Consultants:  None  Procedures:  None  Antimicrobials: Ceftriaxone  IV   Subjective: No issues from overnight events.  Objective: BP 128/69 (BP Location: Left Arm)    Pulse 61   Temp 98.5 F (36.9 C) (Oral)   Resp 19   Ht 5' 1 (1.549 m)   Wt 67.6 kg   SpO2 94%   BMI 28.15 kg/m   Examination:  General exam: Appears calm and comfortable. Respiratory system: Clear to auscultation. Respiratory effort normal. Cardiovascular system: S1 & S2 heard, RRR. Systolic murmur.   Data Reviewed: I have personally reviewed following labs and imaging studies   Last CBC Lab Results  Component Value Date   WBC 6.4 10/01/2024   HGB 11.3 (L) 10/01/2024   HCT 35.9 (L) 10/01/2024   MCV 97.6 10/01/2024   MCH 30.7 10/01/2024   RDW 12.7 10/01/2024   PLT 173 10/01/2024     Last metabolic panel Lab Results  Component Value Date   GLUCOSE 105 (H) 10/01/2024   NA 141 10/01/2024   K 3.8 10/01/2024   CL 106 10/01/2024   CO2 25 10/01/2024   BUN 29 (H) 10/01/2024   CREATININE 0.84 10/01/2024   GFRNONAA >60 10/01/2024   CALCIUM  9.0 10/01/2024   PROT 6.3 (L) 10/01/2024   ALBUMIN 3.8 10/01/2024   BILITOT 0.3 10/01/2024   ALKPHOS 72 10/01/2024   AST 25 10/01/2024   ALT 14 10/01/2024   ANIONGAP 10 10/01/2024     Creatinine Clearance: Estimated Creatinine Clearance: 35.2 mL/min (by C-G formula based on SCr of 0.84 mg/dL).  Recent Results (from the past 240 hours)  Urine Culture     Status: Abnormal   Collection Time: 10/01/24  9:57 PM   Specimen: Urine, Clean Catch  Result Value Ref Range Status   Specimen Description URINE, CLEAN CATCH  Final   Special Requests   Final    NONE Performed at [facility], [address] [place], [place] [address]    Culture MULTIPLE SPECIES PRESENT, SUGGEST RECOLLECTION (A)  Final   Report Status 10/02/2024  FINAL  Final      Radiology Studies: No results found.     LOS: 4 days    Elgin Lam, MD [facility] 10/05/2024, 8:06 AM   If 7PM-7AM, please contact night-coverage [email or web address]  "

## 2024-10-05 NOTE — TOC Progression Note (Addendum)
 Transition of Care [facility]) - Progression Note    Patient Details  Name: [name] MRN: [id number] Date of Birth: 08/19/1927  [facility]) CM/SW Contact  Lendia Dais, [place] Phone Number: 10/05/2024, 11:26 AM  Clinical Narrative: CSW spoke to the pt and daughter Clarita at bedside. Pt is [facility] and Clarita was agreeable to [employer]  Auth ID [id number] and approved from 10/06/2024-10/08/2024. [facility] can accept the pt tomorrow. Pt will go by [facility].  1422 - Per [facility], pt has to be without a sitter for 72 hours prior to admission. MD notified.  CSW will continue to monitor.      Expected Discharge Plan: Skilled Nursing Facility Barriers to Discharge: Continued Medical Work up               Expected Discharge Plan and Services In-house Referral: Clinical Social Work     Living arrangements for the past 2 months: Skilled Nursing Facility                                       Social Drivers of Health (SDOH) Interventions SDOH Screenings   Food Insecurity: No Food Insecurity (10/02/2024)  Housing: Low Risk (10/02/2024)  Transportation Needs: No Transportation Needs (10/02/2024)  Utilities: Not At Risk (10/02/2024)  Financial Resource Strain: Low Risk (07/29/2024)   Received from [facility]  Physical Activity: Inactive (07/29/2024)   Received from [facility]  Social Connections: Socially Isolated (10/02/2024)  Stress: No Stress Concern Present (07/29/2024)   Received from [facility]  Tobacco Use: Unknown (10/02/2024)    Readmission Risk Interventions     No data to display

## 2024-10-06 DIAGNOSIS — N3000 Acute cystitis without hematuria: Secondary | ICD-10-CM | POA: Diagnosis not present

## 2024-10-06 MED ORDER — HALOPERIDOL LACTATE 5 MG/ML IJ SOLN
1.0000 mg | Freq: Once | INTRAMUSCULAR | Status: AC | PRN
  Administered 2024-10-07: 1 mg via INTRAVENOUS
  Filled 2024-10-06: qty 1

## 2024-10-06 NOTE — TOC Progression Note (Signed)
 Transition of Care [facility]) - Progression Note    Patient Details  Name: [name] MRN: [id number] Date of Birth: 07/07/1927  Transition of Care [facility]) CM/SW Contact  Lendia Dais, [place] Phone Number: 10/06/2024, 12:14 PM  Clinical Narrative: CSW received a message from [facility] [place] who requested the CSW to inform the pt's family to fill out the admissions paperwork today.  CSW spoke to Clarita (daughter) via phone and informed her that that [facility] could accept the pt Friday and that the admissions paperwork needs to be filled out today. Clarita stated she could do it today at 3:00-3:30. CSW notified Tanya.   Shara is valid until 10/08/2024. CSW will continue to monitor.    Expected Discharge Plan: Skilled Nursing Facility Barriers to Discharge: Continued Medical Work up               Expected Discharge Plan and Services In-house Referral: Clinical Social Work     Living arrangements for the past 2 months: Skilled Nursing Facility                                       Social Drivers of Health (SDOH) Interventions SDOH Screenings   Food Insecurity: No Food Insecurity (10/02/2024)  Housing: Low Risk (10/02/2024)  Transportation Needs: No Transportation Needs (10/02/2024)  Utilities: Not At Risk (10/02/2024)  Financial Resource Strain: Low Risk (07/29/2024)   Received from [facility]  Physical Activity: Inactive (07/29/2024)   Received from [facility]  Social Connections: Socially Isolated (10/02/2024)  Stress: No Stress Concern Present (07/29/2024)   Received from [facility]  Tobacco Use: Unknown (10/02/2024)    Readmission Risk Interventions     No data to display

## 2024-10-06 NOTE — Progress Notes (Signed)
 [facility] Progress Note Patient: [name] FMW:[id number] DOB: 1927-08-03  DOA: 10/01/2024 DOS: the patient was seen and examined on 10/06/2024  Brief Hospital Course: Patient with PMH of GERD, HLD, PMR, hypothyroidism, RLS presented to the hospital with complaints of confusion. Thought to be secondary to acute UTI.  Treated with IV antibiotic. Mentation improving.  Assessment and Plan: Acute metabolic encephalopathy/delirium. In setting of UTI. Treated with IV fluids.  Acute UTI. Cultures negative. Treated with IV antibiotic Mentation improving.  Agitation resolved.  RLS. Recommended continue.  Hypothyroidism Continue Synthroid .  Chronic pain and neuropathy on gabapentin . Continue.  Dementia.  With delirium. On donepezil , Depakote .   Continue.  HTN. Blood pressure stable. Continue ARB.  HLD. Continuing statin.  Subjective: Remains pleasantly confused.  No nausea or vomiting.  Physical Exam: Aortic systolic murmur. S1-S2 present.  Bowel sound present No edema. Oriented to self.  Data Reviewed: I have Reviewed nursing notes, Vitals, and Lab results.   Disposition: Status is: Inpatient Remains inpatient appropriate because: Awaiting placement.  enoxaparin  (LOVENOX ) injection 40 mg Start: 10/02/24 1400 SCDs Start: 10/02/24 0302  Family Communication: No one at bedside Level of care: Med-Surg   Vitals:   10/06/24 0524 10/06/24 0725 10/06/24 0730 10/06/24 1627  BP: 138/80 133/74  121/86  Pulse: 70 (!) 57  79  Resp: 19 18  18   Temp: (!) 97.3 F (36.3 C) (!) 97.5 F (36.4 C)  98.1 F (36.7 C)  TempSrc: Oral Oral  Oral  SpO2: 95% (!) 88% 90% 93%  Weight:      Height:         Author: Yetta Blanch, MD 10/06/2024 6:32 PM  Please look on [email or web address] to find out who is on call.

## 2024-10-07 DIAGNOSIS — N3000 Acute cystitis without hematuria: Secondary | ICD-10-CM | POA: Diagnosis not present

## 2024-10-07 MED ORDER — QUETIAPINE FUMARATE 25 MG PO TABS
25.0000 mg | ORAL_TABLET | Freq: Every day | ORAL | Status: DC
  Administered 2024-10-07: 25 mg via ORAL
  Filled 2024-10-07: qty 1

## 2024-10-07 NOTE — Progress Notes (Signed)
 [facility] Progress Note Patient: [name] FMW:[id number] DOB: 01-Oct-1927  DOA: 10/01/2024 DOS: the patient was seen and examined on 10/07/2024  Brief Hospital Course: Patient with PMH of GERD, HLD, PMR, hypothyroidism, RLS presented to the hospital with complaints of confusion. Thought to be secondary to acute UTI.  Treated with IV antibiotic. Mentation improving.  Assessment and Plan: Acute metabolic encephalopathy/delirium. In setting of UTI. Treated with IV fluids.  Acute UTI. Cultures negative. Treated with IV antibiotic Mentation improving.  Agitation resolved.  RLS. Recommended continue.  Hypothyroidism Continue Synthroid .  Chronic pain and neuropathy on gabapentin . Continue.  Dementia.  With delirium. On donepezil , Depakote .   Continue.  HTN. Blood pressure stable. Continue ARB.  HLD. Continuing statin.  Subjective: No acute complaint.  No acute events overnight.  Oral intake adequate.  Physical Exam: Clear to auscultation. S1-S2 present.  Some bowel sound present. No edema.  Data Reviewed: I have Reviewed nursing notes, Vitals, and Lab results.   Disposition: Status is: Inpatient Remains inpatient appropriate because: Awaiting placement.  enoxaparin  (LOVENOX ) injection 40 mg Start: 10/02/24 1400 SCDs Start: 10/02/24 0302  Family Communication: No one at bedside Level of care: Med-Surg   Vitals:   10/06/24 2051 10/07/24 0506 10/07/24 0746 10/07/24 1604  BP: (!) 120/58 (!) 157/83 (!) 154/89 110/60  Pulse: 75 63 64 68  Resp: 19 19 18 18   Temp: 98.2 F (36.8 C) 97.6 F (36.4 C)  (!) 97.5 F (36.4 C)  TempSrc: Oral Oral  Oral  SpO2: 97% 96% 100% 100%  Weight:      Height:         Author: Yetta Blanch, MD 10/07/2024 6:15 PM  Please look on [email or web address] to find out who is on call.

## 2024-10-07 NOTE — Plan of Care (Signed)

## 2024-10-07 NOTE — Plan of Care (Signed)

## 2024-10-08 DIAGNOSIS — N3000 Acute cystitis without hematuria: Secondary | ICD-10-CM | POA: Diagnosis not present

## 2024-10-08 NOTE — Care Management Important Message (Addendum)
 Important Message  Patient Details  Name: [name] MRN: [id number] Date of Birth: [birth year]   Important Message Given:  Yes - Medicare IM

## 2024-10-08 NOTE — Discharge Summary (Signed)
 " Physician Discharge Summary   Patient: [name] MRN: [id number] DOB: 01-28-1927  Admit date:     10/01/2024  Discharge date: 10/08/2024  Discharge Physician: Yetta Blanch  PCP: McClanahan, Kyra, NP  Recommendations at discharge:  Follow up with PCP as recommended   Contact information for follow-up providers     Wallie Drafts, NP. Schedule an appointment as soon as possible for a visit in 1 week(s).   Specialty: Adult Health Nurse Practitioner Contact information: [address] [place] [address] [phone]              Contact information for after-discharge care     Destination     [facility] [place], [place] .   Service: Skilled Nursing Contact information: [address] [place]  [address] [phone]                    Hospital Course: Patient with PMH of GERD, HLD, PMR, hypothyroidism, RLS presented to the hospital with complaints of confusion. Thought to be secondary to acute UTI.  Treated with IV antibiotic. Mentation improving.   Assessment and Plan: Acute metabolic encephalopathy/delirium. In setting of UTI. Treated with IV fluids.   Acute UTI. Cultures negative. Treated with IV antibiotic Mentation improving.  Agitation resolved.   RLS. Recommended continue.   Hypothyroidism Continue Synthroid .   Chronic pain and neuropathy on gabapentin . Continue.   Dementia.  With delirium. On donepezil , Depakote .   Continue.   HTN. Blood pressure stable. Continue ARB.   HLD. Continuing statin.   Consultants:  none  Procedures performed:  none  DISCHARGE MEDICATION: Allergies as of 10/08/2024       Reactions   Levofloxacin    REACTION: nausea   Statins    Unknown reaction        Medication List     TAKE these medications    rOPINIRole  1 MG tablet Commonly known as: REQUIP  Take 1 mg by mouth at bedtime. The timing of this medication is very important.    rOPINIRole  0.5 MG tablet Commonly known as: REQUIP  Take 0.5 mg by mouth 2 (two) times daily. The timing of this medication is very important.   atorvastatin  10 MG tablet Commonly known as: LIPITOR Take 10 mg by mouth daily.   calcium -vitamin D  500-200 MG-UNIT tablet Commonly known as: OSCAL WITH D Take 1 tablet by mouth daily with breakfast.   divalproex  125 MG DR tablet Commonly known as: Depakote  Take two tabs at night   donepezil  10 MG tablet Commonly known as: ARICEPT  Take one tablet daily   gabapentin  300 MG capsule Commonly known as: NEURONTIN  Take 300 mg by mouth 2 (two) times daily.   levothyroxine  112 MCG tablet Commonly known as: SYNTHROID  Take 112 mcg by mouth daily.   telmisartan 40 MG tablet Commonly known as: MICARDIS Take 40 mg by mouth daily.       Disposition: SNF Diet recommendation: Regular diet  Discharge Exam: Vitals:   10/07/24 0746 10/07/24 1604 10/07/24 1957 10/08/24 0828  BP: (!) 154/89 110/60 127/64 (!) 146/75  Pulse: 64 68 72 (!) 57  Resp: 18 18 18 18   Temp:  (!) 97.5 F (36.4 C) 97.9 F (36.6 C) 98 F (36.7 C)  TempSrc:  Oral  Oral  SpO2: 100% 100% 95% 98%  Weight:      Height:       General: in Mild distress, No Rash Cardiovascular: S1 and S2 Present,  Respiratory: Good respiratory  effort, Bilateral Air entry present. No Crackles, No wheezes Extremities: No edema  Filed Weights   10/01/24 1928  Weight: 67.6 kg   Condition at discharge: stable  The results of significant diagnostics from this hospitalization (including imaging, microbiology, ancillary and laboratory) are listed below for reference.   Imaging Studies: CT CERVICAL SPINE WO CONTRAST Result Date: 10/01/2024 EXAM: CT CERVICAL SPINE WITHOUT CONTRAST 10/01/2024 08:18:00 PM TECHNIQUE: CT of the cervical spine was performed without the administration of intravenous contrast. Multiplanar reformatted images are provided for review. Automated exposure control,  iterative reconstruction, and/or weight based adjustment of the mA/kV was utilized to reduce the radiation dose to as low as reasonably achievable. COMPARISON: 04/14/2024 prominent CLINICAL HISTORY: Neck trauma (Age >= 65y) FINDINGS: BONES AND ALIGNMENT: Anterolisthesis of C3. No acute fracture or traumatic malalignment. DEGENERATIVE CHANGES: Multilevel spondylosis, disc space height loss, and degenerative endplate changes greatest at C5-C6 where it is advanced. Posterior disc osteophyte complex at C3-C4 and C5-C6 causing moderate thecal sac narrowing. Multilevel advanced facet arthropathy, greatest on the left at C3-C4. SOFT TISSUES: No prevertebral soft tissue swelling. IMPRESSION: 1. No acute findings. Electronically signed by: Norman Gatlin MD 10/01/2024 08:41 PM EST RP Workstation: [id number]   CT Head Wo Contrast Result Date: 10/01/2024 EXAM: CT HEAD WITHOUT CONTRAST 10/01/2024 08:18:00 PM TECHNIQUE: CT of the head was performed without the administration of intravenous contrast. Automated exposure control, iterative reconstruction, and/or weight based adjustment of the mA/kV was utilized to reduce the radiation dose to as low as reasonably achievable. COMPARISON: 04/14/2024 CLINICAL HISTORY: Head trauma, minor (Age >= 65y). FINDINGS: BRAIN AND VENTRICLES: No acute hemorrhage. No evidence of acute infarct. Proportional prominence of ventricles and sulci, consistent with diffuse cerebral parenchymal volume loss. Periventricular and subcortical white matter hypoattenuation, consistent with moderate chronic ischemic microvascular disease. Calcified atherosclerotic plaque within cavernous/supraclinoid internal carotid arteries. No hydrocephalus. No extra-axial collection. No mass effect or midline shift. ORBITS: Bilateral lens replacements. SINUSES: No acute abnormality. SOFT TISSUES AND SKULL: No skull fracture. IMPRESSION: 1. No acute intracranial abnormality. Electronically signed by: Norman Gatlin MD  10/01/2024 08:38 PM EST RP Workstation: [id number]    Microbiology: Results for orders placed or performed during the hospital encounter of 10/01/24  Urine Culture     Status: Abnormal   Collection Time: 10/01/24  9:57 PM   Specimen: Urine, Clean Catch  Result Value Ref Range Status   Specimen Description URINE, CLEAN CATCH  Final   Special Requests   Final    NONE Performed at [facility], [address], [place], [place] [address]    Culture MULTIPLE SPECIES PRESENT, SUGGEST RECOLLECTION (A)  Final   Report Status 10/02/2024 FINAL  Final   Labs: CBC: Recent Labs  Lab 10/01/24 2057  WBC 6.4  HGB 11.3*  HCT 35.9*  MCV 97.6  PLT 173   Basic Metabolic Panel: Recent Labs  Lab 10/01/24 2057  NA 141  K 3.8  CL 106  CO2 25  GLUCOSE 105*  BUN 29*  CREATININE 0.84  CALCIUM  9.0   Liver Function Tests: Recent Labs  Lab 10/01/24 2057  AST 25  ALT 14  ALKPHOS 72  BILITOT 0.3  PROT 6.3*  ALBUMIN 3.8   CBG: Recent Labs  Lab 10/01/24 2101  GLUCAP 94    Discharge time spent: 35 minutes  Author: Yetta Blanch, MD  [facility]    "

## 2024-10-08 NOTE — TOC Transition Note (Signed)
 Transition of Care [facility]) - Discharge Note   Patient Details  Name: [name] MRN: [id number] Date of Birth: [birth year]  [facility] CM/SW Contact:  Lendia Dais, LCSWA Phone Number: 10/08/2024, 12:24 PM   Clinical Narrative:  Pt is discharging to [facility] RN report to [phone]. [facility] called @ 11:26, stated they would arrive within 45 minutes. Med necessity for placed in DC packet.  CSW informed [name] (daughter) via phone and discharge and transportation.  No further TOC needs.     Final next level of care: Skilled Nursing Facility Barriers to Discharge: Continued Medical Work up   Patient Goals and CMS Choice Patient states their goals for this hospitalization and ongoing recovery are:: Pt oriented to self only CMS Medicare.gov Compare Post Acute Care list provided to:: Patient Represenative (must comment) (Daughter [name] [facility])) Choice offered to / list presented to : Adult Children      Discharge Placement              Patient chooses bed at: [facility] Patient to be transferred to facility by: [facility] Name of family member notified: [name] Patient and family notified of of transfer: 10/08/24  Discharge Plan and Services Additional resources added to the After Visit Summary for   In-house Referral: Clinical Social Work                                   Social Drivers of Health (SDOH) Interventions SDOH Screenings   Food Insecurity: No Food Insecurity (10/02/2024)  Housing: Low Risk (10/02/2024)  Transportation Needs: No Transportation Needs (10/02/2024)  Utilities: Not At Risk (10/02/2024)  Financial Resource Strain: Low Risk (07/29/2024)   Received from [facility]  Physical Activity: Inactive (07/29/2024)   Received from [facility]  Social Connections: Socially Isolated (10/02/2024)  Stress: No Stress Concern Present (07/29/2024)   Received from [facility]  Tobacco Use: Unknown (10/02/2024)      Readmission Risk Interventions     No data to display

## 2024-10-08 NOTE — Progress Notes (Signed)
 AVS completed for discharge packet and placed with chart.

## 2024-10-10 ENCOUNTER — Encounter (HOSPITAL_COMMUNITY): Payer: Self-pay

## 2024-10-10 ENCOUNTER — Other Ambulatory Visit: Payer: Self-pay

## 2024-10-10 ENCOUNTER — Emergency Department (HOSPITAL_COMMUNITY)

## 2024-10-10 ENCOUNTER — Observation Stay (HOSPITAL_COMMUNITY)
Admission: EM | Admit: 2024-10-10 | Discharge: 2024-10-12 | Disposition: A | Attending: Internal Medicine | Admitting: Internal Medicine

## 2024-10-10 DIAGNOSIS — F03C0 Unspecified dementia, severe, without behavioral disturbance, psychotic disturbance, mood disturbance, and anxiety: Secondary | ICD-10-CM | POA: Diagnosis not present

## 2024-10-10 DIAGNOSIS — G2581 Restless legs syndrome: Secondary | ICD-10-CM | POA: Insufficient documentation

## 2024-10-10 DIAGNOSIS — Z7189 Other specified counseling: Secondary | ICD-10-CM

## 2024-10-10 DIAGNOSIS — C73 Malignant neoplasm of thyroid gland: Secondary | ICD-10-CM | POA: Diagnosis present

## 2024-10-10 DIAGNOSIS — Z79899 Other long term (current) drug therapy: Secondary | ICD-10-CM | POA: Diagnosis not present

## 2024-10-10 DIAGNOSIS — R55 Syncope and collapse: Secondary | ICD-10-CM | POA: Diagnosis present

## 2024-10-10 DIAGNOSIS — Z79890 Hormone replacement therapy (postmenopausal): Secondary | ICD-10-CM | POA: Insufficient documentation

## 2024-10-10 DIAGNOSIS — E785 Hyperlipidemia, unspecified: Secondary | ICD-10-CM | POA: Diagnosis not present

## 2024-10-10 DIAGNOSIS — R9431 Abnormal electrocardiogram [ECG] [EKG]: Secondary | ICD-10-CM | POA: Insufficient documentation

## 2024-10-10 DIAGNOSIS — R001 Bradycardia, unspecified: Secondary | ICD-10-CM | POA: Diagnosis not present

## 2024-10-10 DIAGNOSIS — K219 Gastro-esophageal reflux disease without esophagitis: Secondary | ICD-10-CM | POA: Diagnosis not present

## 2024-10-10 DIAGNOSIS — F02C0 Dementia in other diseases classified elsewhere, severe, without behavioral disturbance, psychotic disturbance, mood disturbance, and anxiety: Secondary | ICD-10-CM | POA: Diagnosis not present

## 2024-10-10 DIAGNOSIS — E039 Hypothyroidism, unspecified: Secondary | ICD-10-CM | POA: Diagnosis present

## 2024-10-10 DIAGNOSIS — M6281 Muscle weakness (generalized): Secondary | ICD-10-CM | POA: Insufficient documentation

## 2024-10-10 DIAGNOSIS — I959 Hypotension, unspecified: Secondary | ICD-10-CM | POA: Insufficient documentation

## 2024-10-10 DIAGNOSIS — I951 Orthostatic hypotension: Secondary | ICD-10-CM | POA: Diagnosis not present

## 2024-10-10 DIAGNOSIS — R54 Age-related physical debility: Secondary | ICD-10-CM | POA: Insufficient documentation

## 2024-10-10 DIAGNOSIS — I10 Essential (primary) hypertension: Secondary | ICD-10-CM | POA: Diagnosis present

## 2024-10-10 DIAGNOSIS — N390 Urinary tract infection, site not specified: Secondary | ICD-10-CM | POA: Diagnosis not present

## 2024-10-10 LAB — COMPREHENSIVE METABOLIC PANEL WITH GFR
ALT: 19 U/L (ref 0–44)
AST: 23 U/L (ref 15–41)
Albumin: 3.8 g/dL (ref 3.5–5.0)
Alkaline Phosphatase: 70 U/L (ref 38–126)
Anion gap: 9 (ref 5–15)
BUN: 32 mg/dL — ABNORMAL HIGH (ref 8–23)
CO2: 25 mmol/L (ref 22–32)
Calcium: 8.8 mg/dL — ABNORMAL LOW (ref 8.9–10.3)
Chloride: 102 mmol/L (ref 98–111)
Creatinine, Ser: 0.87 mg/dL (ref 0.44–1.00)
GFR, Estimated: >60 mL/min
Glucose, Bld: 114 mg/dL — ABNORMAL HIGH (ref 70–99)
Potassium: 4.2 mmol/L (ref 3.5–5.1)
Sodium: 136 mmol/L (ref 135–145)
Total Bilirubin: 0.3 mg/dL (ref 0.0–1.2)
Total Protein: 6.3 g/dL — ABNORMAL LOW (ref 6.5–8.1)

## 2024-10-10 LAB — TROPONIN T, HIGH SENSITIVITY: Troponin T High Sensitivity: 21 ng/L — ABNORMAL HIGH (ref 0–19)

## 2024-10-10 LAB — CBC WITH DIFFERENTIAL/PLATELET
Abs Immature Granulocytes: 0.03 K/uL (ref 0.00–0.07)
Basophils Absolute: 0 K/uL (ref 0.0–0.1)
Basophils Relative: 1 %
Eosinophils Absolute: 0.2 K/uL (ref 0.0–0.5)
Eosinophils Relative: 3 %
HCT: 34.4 % — ABNORMAL LOW (ref 36.0–46.0)
Hemoglobin: 11 g/dL — ABNORMAL LOW (ref 12.0–15.0)
Immature Granulocytes: 1 %
Lymphocytes Relative: 19 %
Lymphs Abs: 1.1 K/uL (ref 0.7–4.0)
MCH: 30.6 pg (ref 26.0–34.0)
MCHC: 32 g/dL (ref 30.0–36.0)
MCV: 95.8 fL (ref 80.0–100.0)
Monocytes Absolute: 0.8 K/uL (ref 0.1–1.0)
Monocytes Relative: 14 %
Neutro Abs: 3.6 K/uL (ref 1.7–7.7)
Neutrophils Relative %: 62 %
Platelets: 190 K/uL (ref 150–400)
RBC: 3.59 MIL/uL — ABNORMAL LOW (ref 3.87–5.11)
RDW: 12.3 % (ref 11.5–15.5)
WBC: 5.7 K/uL (ref 4.0–10.5)
nRBC: 0 % (ref 0.0–0.2)

## 2024-10-10 LAB — URINALYSIS, ROUTINE W REFLEX MICROSCOPIC
Bilirubin Urine: NEGATIVE
Glucose, UA: NEGATIVE mg/dL
Ketones, ur: NEGATIVE mg/dL
Nitrite: NEGATIVE
Protein, ur: NEGATIVE mg/dL
Specific Gravity, Urine: 1.011 (ref 1.005–1.030)
pH: 6 (ref 5.0–8.0)

## 2024-10-10 LAB — I-STAT CHEM 8, ED
BUN: 32 mg/dL — ABNORMAL HIGH (ref 8–23)
Calcium, Ion: 1.17 mmol/L (ref 1.15–1.40)
Chloride: 101 mmol/L (ref 98–111)
Creatinine, Ser: 0.9 mg/dL (ref 0.44–1.00)
Glucose, Bld: 113 mg/dL — ABNORMAL HIGH (ref 70–99)
HCT: 33 % — ABNORMAL LOW (ref 36.0–46.0)
Hemoglobin: 11.2 g/dL — ABNORMAL LOW (ref 12.0–15.0)
Potassium: 4.2 mmol/L (ref 3.5–5.1)
Sodium: 138 mmol/L (ref 135–145)
TCO2: 26 mmol/L (ref 22–32)

## 2024-10-10 LAB — URINALYSIS, W/ REFLEX TO CULTURE (INFECTION SUSPECTED)
Bilirubin Urine: NEGATIVE
Glucose, UA: NEGATIVE mg/dL
Ketones, ur: NEGATIVE mg/dL
Nitrite: NEGATIVE
Protein, ur: NEGATIVE mg/dL
Specific Gravity, Urine: 1.011 (ref 1.005–1.030)
pH: 6 (ref 5.0–8.0)

## 2024-10-10 LAB — PRO BRAIN NATRIURETIC PEPTIDE: Pro Brain Natriuretic Peptide: 261 pg/mL

## 2024-10-10 LAB — MAGNESIUM: Magnesium: 2.1 mg/dL (ref 1.7–2.4)

## 2024-10-10 LAB — TSH: TSH: 3.87 u[IU]/mL (ref 0.350–4.500)

## 2024-10-10 MED ORDER — HALOPERIDOL LACTATE 5 MG/ML IJ SOLN
2.0000 mg | Freq: Once | INTRAMUSCULAR | Status: AC | PRN
  Administered 2024-10-10: 2 mg via INTRAMUSCULAR
  Filled 2024-10-10: qty 1

## 2024-10-10 MED ORDER — OYSTER SHELL CALCIUM/D3 500-5 MG-MCG PO TABS
1.0000 | ORAL_TABLET | Freq: Every day | ORAL | Status: DC
  Administered 2024-10-11 – 2024-10-12 (×2): 1 via ORAL
  Filled 2024-10-10: qty 1

## 2024-10-10 MED ORDER — HYDRALAZINE HCL 20 MG/ML IJ SOLN
10.0000 mg | INTRAMUSCULAR | Status: DC | PRN
  Administered 2024-10-12 (×2): 10 mg via INTRAVENOUS

## 2024-10-10 MED ORDER — ONDANSETRON HCL 4 MG PO TABS: 4.0000 mg | ORAL_TABLET | Freq: Four times a day (QID) | ORAL | Status: DC | PRN

## 2024-10-10 MED ORDER — ONDANSETRON HCL 4 MG/2ML IJ SOLN: 4.0000 mg | Freq: Four times a day (QID) | INTRAMUSCULAR | Status: DC | PRN

## 2024-10-10 MED ORDER — LEVOTHYROXINE SODIUM 112 MCG PO TABS
112.0000 ug | ORAL_TABLET | Freq: Every day | ORAL | Status: DC
  Administered 2024-10-10 – 2024-10-12 (×3): 112 ug via ORAL
  Filled 2024-10-10: qty 1

## 2024-10-10 MED ORDER — CALCIUM CARBONATE-VITAMIN D 500-200 MG-UNIT PO TABS: 1.0000 | ORAL_TABLET | Freq: Every day | ORAL | Status: DC

## 2024-10-10 MED ORDER — HYDROMORPHONE HCL 1 MG/ML IJ SOLN: 0.5000 mg | INTRAMUSCULAR | Status: DC | PRN

## 2024-10-10 MED ORDER — DIVALPROEX SODIUM 125 MG PO DR TAB
125.0000 mg | DELAYED_RELEASE_TABLET | Freq: Every day | ORAL | Status: DC
  Administered 2024-10-10 – 2024-10-11 (×2): 125 mg via ORAL
  Filled 2024-10-10 (×2): qty 1

## 2024-10-10 MED ORDER — ACETAMINOPHEN 325 MG PO TABS: 650.0000 mg | ORAL_TABLET | Freq: Four times a day (QID) | ORAL | Status: DC | PRN

## 2024-10-10 MED ORDER — GABAPENTIN 100 MG PO CAPS
100.0000 mg | ORAL_CAPSULE | Freq: Two times a day (BID) | ORAL | Status: DC
  Administered 2024-10-10 – 2024-10-12 (×5): 100 mg via ORAL
  Filled 2024-10-10 (×2): qty 1

## 2024-10-10 MED ORDER — SENNOSIDES-DOCUSATE SODIUM 8.6-50 MG PO TABS: 1.0000 | ORAL_TABLET | Freq: Every evening | ORAL | Status: DC | PRN

## 2024-10-10 MED ORDER — IRBESARTAN 150 MG PO TABS
75.0000 mg | ORAL_TABLET | Freq: Every day | ORAL | Status: DC
  Administered 2024-10-10 – 2024-10-11 (×2): 75 mg via ORAL
  Filled 2024-10-10 (×2): qty 1

## 2024-10-10 MED ORDER — DONEPEZIL HCL 10 MG PO TABS
10.0000 mg | ORAL_TABLET | Freq: Every day | ORAL | Status: DC
  Administered 2024-10-10 – 2024-10-11 (×2): 10 mg via ORAL
  Filled 2024-10-10: qty 1

## 2024-10-10 MED ORDER — ATORVASTATIN CALCIUM 10 MG PO TABS
10.0000 mg | ORAL_TABLET | Freq: Every day | ORAL | Status: DC
  Administered 2024-10-10 – 2024-10-12 (×3): 10 mg via ORAL
  Filled 2024-10-10 (×2): qty 1

## 2024-10-10 MED ORDER — ROPINIROLE HCL 0.25 MG PO TABS
0.2500 mg | ORAL_TABLET | Freq: Every day | ORAL | Status: DC
  Administered 2024-10-10 – 2024-10-11 (×2): 0.25 mg via ORAL
  Filled 2024-10-10 (×2): qty 1

## 2024-10-10 MED ORDER — ACETAMINOPHEN 650 MG RE SUPP: 650.0000 mg | Freq: Four times a day (QID) | RECTAL | Status: DC | PRN

## 2024-10-10 MED ORDER — SODIUM CHLORIDE 0.9 % IV SOLN: INTRAVENOUS | Status: DC

## 2024-10-10 MED ORDER — TRAZODONE HCL 50 MG PO TABS
25.0000 mg | ORAL_TABLET | Freq: Every evening | ORAL | Status: DC | PRN
  Administered 2024-10-10: 25 mg via ORAL
  Filled 2024-10-10: qty 1

## 2024-10-10 MED ORDER — SODIUM CHLORIDE 0.9% FLUSH
3.0000 mL | Freq: Two times a day (BID) | INTRAVENOUS | Status: DC
  Administered 2024-10-11 – 2024-10-12 (×2): 3 mL via INTRAVENOUS

## 2024-10-10 MED ORDER — OXYCODONE HCL 5 MG PO TABS: 5.0000 mg | ORAL_TABLET | ORAL | Status: DC | PRN

## 2024-10-10 MED ORDER — IPRATROPIUM BROMIDE 0.02 % IN SOLN: 0.5000 mg | Freq: Four times a day (QID) | RESPIRATORY_TRACT | Status: DC | PRN

## 2024-10-10 MED ORDER — FLEET ENEMA RE ENEM: 1.0000 | ENEMA | Freq: Once | RECTAL | Status: DC | PRN

## 2024-10-10 MED ORDER — HEPARIN SODIUM (PORCINE) 5000 UNIT/ML IJ SOLN
5000.0000 [IU] | Freq: Three times a day (TID) | INTRAMUSCULAR | Status: DC
  Administered 2024-10-10 – 2024-10-12 (×7): 5000 [IU] via SUBCUTANEOUS
  Filled 2024-10-10 (×2): qty 1

## 2024-10-10 MED ORDER — BISACODYL 5 MG PO TBEC: 5.0000 mg | DELAYED_RELEASE_TABLET | Freq: Every day | ORAL | Status: DC | PRN

## 2024-10-10 NOTE — Assessment & Plan Note (Addendum)
 With history of hypothyroidism TSH normal at 3.87, -Continue home dose Synthroid 

## 2024-10-10 NOTE — ED Notes (Signed)
 Pt would not keep bair hugger blanket on and kept taking it off, rechecked her rectal temp and temp up, bair hugger removed and pt given warm blankets from blanket warmer and able to leave those on.

## 2024-10-10 NOTE — Assessment & Plan Note (Signed)
 Syncope and collapse x 2 today associated with bradycardia, hypotension, Mentation, heart rate and blood pressure has improved Now Recent echocardiogram 01/31/2022 was reviewed, within normal limits Troponin 21, 21 EKG on arrival to the ED was reviewed, normal sinus rhythm 1140 acute changes TSH-within normal limits -Will continue heart monitor -No signs of infection, -Gentle IV fluid hydration, orthostatic BP checks -Continue with neurochecks -Cardiologist consulted, appreciate further evaluation recommendation Patient may need a Holter monitor on discharge

## 2024-10-10 NOTE — ED Notes (Signed)
 Pt family member came out of room to notify RN that pt removed IV and is becoming more agitated. MD notified.

## 2024-10-10 NOTE — Assessment & Plan Note (Signed)
 Monitoring blood pressure closely, continue to check orthostatic Bps

## 2024-10-10 NOTE — ED Triage Notes (Signed)
 [facility] reports pt coming from [facility]. Staff states pt was sitting at table eating and went unresponsive, pulseless/apneic and staff moved her to the room and called 911. Upon [facility] arrival pt was talking with pulses. No CPR initiated by staff. Pt moved to unit and began to brady down the 40's then 30's and went apniec, EMS gave 1mg  of atropine and pt began breathing again and pulse 100, BP 160/74. Pt full code.

## 2024-10-10 NOTE — ED Notes (Signed)
 Rn entered room to strait cath pt and pt had urinated in the bed. RN cleaned pt.

## 2024-10-10 NOTE — ED Notes (Signed)
 Bair hugger applied.

## 2024-10-10 NOTE — Assessment & Plan Note (Signed)
 Continue statins

## 2024-10-10 NOTE — Consult Note (Signed)
 "  Cardiology Consultation   Patient ID: [name] MRN: [id number]; DOB: Feb 26, 1927  Admit date: 10/10/2024 Date of Consult: 10/10/2024  PCP:  Wallie Drafts, NP   [facility] Providers Cardiologist:  None    Patient Profile: [name] is a 89 y.o. female with a hx of GERD, hyperlipidemia, PMR, hypothyroidism, RLS who is being seen 10/10/2024 for the evaluation of unresponsive episode, bradycardia at the request of ED.  History of Present Illness: [name] has no prior cardiac history.  She does have prior echocardiogram 01/2022 that was normal.  She was recently discharged from hospital 12/26 for syncope/confusion felt secondary to UTI although cultures negative.  Currently patient being evaluated for an unresponsive episode.  She was just discharged from [facility] rehab and reportedly was eating at the table and had unresponsive episode.  Staff reportedly said she was apneic and went pulseless, they drug her to a room but did not do CPR.  Fire got there and at that time was answering questions and talking and had a pulse.  EMS started transporting her and then she had another apneic/unresponsive episode with reported vitals of being hypotensive and with heart rates in the 30s.  Again no CPR or ACLS performed but they did give her some atropine with improvement in heart rates.  She was responsive and answering questions.  No clear etiologies at this time.  No electrolyte abnormalities.  UA is pending.  proBNP 261.  Troponin 21-21.  TSH normal.  EKG on arrival without any acute ST-T wave changes or evidence of high-grade AV block.  Daughter is accompanied with her in the room today.  Patient has underlying dementia and very sleepy at this time so unable to really provide any report and she has no recollection of any of the events.  Per per daughter she says she has not had any syncopal episodes until recently.  Reportedly during prior admission she had a episode of  syncope and that is what had brought them to the hospital although this was not witnessed and reported by caregiver.  She has had no complaints of any dizziness, lightheadedness, chest pain, palpitations, shortness of breath, orthopnea, tachycardia.  Right now she is resting comfortably in the room.  No acute concerns right now.   Past Medical History:  Diagnosis Date   Arthritis    GERD (gastroesophageal reflux disease)    Hyperlipidemia    Polymyalgia rheumatica    Thyroid  nodule     Past Surgical History:  Procedure Laterality Date   ABDOMINAL HYSTERECTOMY  1978   APPENDECTOMY  1948   BACK SURGERY     x2   CATARACT EXTRACTION, BILATERAL  03-12-12   bilateral   CHOLECYSTECTOMY  1966   THYROID  LOBECTOMY  03/17/2012   Procedure: THYROID  LOBECTOMY;  Surgeon: Morene ONEIDA Olives, MD;  Location: [facility];  Service: General;  Laterality: Right;   THYROIDECTOMY  03/17/2012   Procedure: THYROIDECTOMY;  Surgeon: Morene ONEIDA Olives, MD;  Location: [facility];  Service: General;  Laterality: N/A;     Scheduled Meds:  Continuous Infusions:  PRN Meds:   Allergies:   Allergies[1]  Social History:   Social History   Socioeconomic History   Marital status: Married    Spouse name: Not on file   Number of children: 1   Years of education: 11   Highest education level: Not on file  Occupational History   Not on file  Tobacco Use   Smoking status: Never  Smokeless tobacco: Not on file  Substance and Sexual Activity   Alcohol use: No   Drug use: No   Sexual activity: Not on file  Other Topics Concern   Not on file  Social History Narrative   Right handed   Drinks caffeine   Lives alone   One daughter   One floor home   Social Drivers of Health   Tobacco Use: Unknown (10/10/2024)   Patient History    Smoking Tobacco Use: Never    Smokeless Tobacco Use: Unknown    Passive Exposure: Not on file  Financial Resource Strain: Low Risk (07/29/2024)   Received from [facility]    Overall Financial Resource Strain (CARDIA)    How hard is it for you to pay for the very basics like food, housing, medical care, and heating?: Not hard at all  Food Insecurity: No Food Insecurity (10/02/2024)   Epic    Worried About Radiation Protection Practitioner of Food in the Last Year: Never true    Ran Out of Food in the Last Year: Never true  Transportation Needs: No Transportation Needs (10/02/2024)   Epic    Lack of Transportation (Medical): No    Lack of Transportation (Non-Medical): No  Physical Activity: Inactive (07/29/2024)   Received from [facility]   Exercise Vital Sign    On average, how many days per week do you engage in moderate to strenuous exercise (like a brisk walk)?: 0 days    Minutes of Exercise per Session: Not on file  Stress: No Stress Concern Present (07/29/2024)   Received from [facility] - Occupational Stress Questionnaire    Do you feel stress - tense, restless, nervous, or anxious, or unable to sleep at night because your mind is troubled all the time - these days?: Not at all  Social Connections: Socially Isolated (10/02/2024)   Social Connection and Isolation Panel    Frequency of Communication with Friends and Family: Never    Frequency of Social Gatherings with Friends and Family: Never    Attends Religious Services: Never    Database Administrator or Organizations: No    Attends Banker Meetings: Never    Marital Status: Widowed  Intimate Partner Violence: Not At Risk (10/02/2024)   Epic    Fear of Current or Ex-Partner: No    Emotionally Abused: No    Physically Abused: No    Sexually Abused: No  Depression (PHQ2-9): Not on file  Alcohol Screen: Not on file  Housing: Low Risk (10/02/2024)   Epic    Unable to Pay for Housing in the Last Year: No    Number of Times Moved in the Last Year: 0    Homeless in the Last Year: No  Utilities: Not At Risk (10/02/2024)   Epic    Threatened with loss of  utilities: No  Health Literacy: Not on file    Family History:   Family History  Problem Relation Age of Onset   Heart disease Mother        heart attack   Stroke Father    Cancer Sister        Thyroid      ROS:  Please see the history of present illness.  All other ROS reviewed and negative.     Physical Exam/Data: Vitals:   10/10/24 1103 10/10/24 1130 10/10/24 1200 10/10/24 1226  BP:  137/82 (!) 150/65   Pulse:  78 75  Resp:  16 14   Temp: (S) (!) 94.5 F (34.7 C)   (!) 94.8 F (34.9 C)  TempSrc: (S) Rectal   (S) Rectal  SpO2:  100% 100%    No intake or output data in the 24 hours ending 10/10/24 1242    10/01/2024    7:28 PM 09/30/2024    3:02 PM 04/14/2024    4:10 PM  Last 3 Weights  Weight (lbs) 149 lb 149 lb 150 lb  Weight (kg) 67.586 kg 67.586 kg 68.04 kg     There is no height or weight on file to calculate BMI.  General:  Well nourished, well developed, in no acute distress.  Drowsy. HEENT: normal Neck: no JVD Vascular: No carotid bruits; Distal pulses 2+ bilaterally Cardiac:  normal S1, S2; RRR; no murmur  Lungs:  clear to auscultation bilaterally, no wheezing, rhonchi or rales  Abd: soft, nontender, no hepatomegaly  Ext: no edema Musculoskeletal:  No deformities, BUE and BLE strength normal and equal Skin: warm and dry  Neuro:  CNs 2-12 intact, no focal abnormalities noted Psych:  Normal affect   EKG:  The EKG was personally reviewed and demonstrates: Sinus rhythm, heart rate 82.  First-degree AV block PR 209.  No acute ST-T wave changes. Telemetry:  Telemetry was personally reviewed and demonstrates: Sinus rhythm.  Heart rate 60-80.  Relevant CV Studies: Echocardiogram 02/10/2022  1. Left ventricular ejection fraction, by estimation, is 60 to 65%. The  left ventricle has normal function. The left ventricle has no regional  wall motion abnormalities. There is mild left ventricular hypertrophy.  Left ventricular diastolic parameters  are consistent  with Grade I diastolic dysfunction (impaired relaxation).  The average left ventricular global longitudinal strain is -21.1 %. The  global longitudinal strain is normal.   2. Right ventricular systolic function is normal. The right ventricular  size is normal.   3. The mitral valve is normal in structure. No evidence of mitral valve  regurgitation. No evidence of mitral stenosis.   4. The aortic valve was not well visualized. There is mild calcification  of the aortic valve. Aortic valve regurgitation is not visualized. Aortic  valve sclerosis is present, with no evidence of aortic valve stenosis.   5. The inferior vena cava is normal in size with greater than 50%  respiratory variability, suggesting right atrial pressure of 3 mmHg.    Laboratory Data: High Sensitivity Troponin:  No results for input(s): TROPONINIHS in the last 720 hours.  Recent Labs  Lab 10/01/24 2310 10/10/24 1101  TRNPT 21* 21*      Chemistry Recent Labs  Lab 10/10/24 1101 10/10/24 1145  NA 136 138  K 4.2 4.2  CL 102 101  CO2 25  --   GLUCOSE 114* 113*  BUN 32* 32*  CREATININE 0.87 0.90  CALCIUM  8.8*  --   MG 2.1  --   GFRNONAA >60  --   ANIONGAP 9  --     Recent Labs  Lab 10/10/24 1101  PROT 6.3*  ALBUMIN 3.8  AST 23  ALT 19  ALKPHOS 70  BILITOT 0.3   Lipids No results for input(s): CHOL, TRIG, HDL, LABVLDL, LDLCALC, CHOLHDL in the last 168 hours.  Hematology Recent Labs  Lab 10/10/24 1101 10/10/24 1145  WBC 5.7  --   RBC 3.59*  --   HGB 11.0* 11.2*  HCT 34.4* 33.0*  MCV 95.8  --   MCH 30.6  --   MCHC 32.0  --  RDW 12.3  --   PLT 190  --    Thyroid   Recent Labs  Lab 10/10/24 1101  TSH 3.870    BNP Recent Labs  Lab 10/10/24 1101  PROBNP 261.0    DDimer No results for input(s): DDIMER in the last 168 hours.  Radiology/Studies:  DG Chest Portable 1 View Result Date: 10/10/2024 EXAM: 1 VIEW(S) XRAY OF THE CHEST 10/10/2024 11:18:00 AM COMPARISON: None  available. CLINICAL HISTORY: Bradycardia, syncope, unresponsiveness, hypotension. FINDINGS: LUNGS AND PLEURA: Low lung volumes. Left basilar opacity, favored to reflect atelectasis. Central pulmonary vasculature slightly accentuated by low lung volumes. No pleural effusion. No pneumothorax. HEART AND MEDIASTINUM: Atherosclerotic calcification of the aortic arch. The cardiomediastinal silhouette is accentuated by low lung volumes and AP projection. BONES AND SOFT TISSUES: Thoracic spondylosis. Old healed left rib fractures. IMPRESSION: 1. Low lung volumes with left basilar opacity, favored to reflect atelectasis. Electronically signed by: Donnice Mania MD 10/10/2024 12:39 PM EST RP Workstation: [id number]     Assessment and Plan:  Unresponsive episode/bradycardia Cardiac arrest? Reportedly she had unresponsive episode while eating at the dinner table and reportedly had no pulse and was apneic but then suddenly gained responsiveness although no CPR was ever performed.  Had another episode with EMS but EMS reported bradycardia with heart rates in the 30s and gave atropine.  There have been no EMS run strips, EKG, telemetry that have shown any acute ST-T wave changes, pauses, high-grade AV block's.  Heart rates here have been between 60-80.  BP hypertensive. Daughter does report that she has been having stomach pain preceding these events so there is a possibility this could be a vasovagal event.  No obvious cardiac etiologies at this time.  proBNP within normal ranges, troponin 21-21.  TSH is normal.  True cardiac arrest seems questionable and reported bradycardia case not been captured. Continue further workup per primary team Will monitor on telemetry evaluate for any arrhythmogenic related causes. Maybe DC on live monitor.  Get echocardiogram   Risk Assessment/Risk Scores:  For questions or updates, please contact [facility] Please consult [email or web address] for contact info under      Signed, Thom LITTIE Sluder, PA-C  10/10/2024 12:42 PM       [1]  Allergies Allergen Reactions   Levofloxacin     REACTION: nausea   Statins     Unknown reaction   "

## 2024-10-10 NOTE — H&P (Signed)
 " History and Physical   Patient: [name]                            PCP: McClanahan, Kyra, NP                    DOB: 1927/05/23            DOA: 10/10/2024 FMW:[id number]             DOS: 10/10/2024, 1:43 PM  McClanahan, Kyra, NP  Patient coming from:   HOME  I have personally reviewed patient's medical records, in electronic medical records, including:  [facility], and care everywhere.    Chief Complaint:   Chief Complaint  Patient presents with   Loss of Consciousness    History of present illness:    [name] is a 89 year old female with past medical history significant for hypothyroidism, HLD, GERD and RLS, dementia presented following a syncopal episode with associated bradycardia and hypotension.  The patient was eating at the table when she became unresponsive. Witnesses reported she appeared apneic and pulseless. No cardiopulmonary resuscitation was performed. Emergency medical services were activated. Upon EMS arrival, the patient was initially responsive but subsequently experienced a second episode of apnea and unresponsiveness. She was found to be hypotensive with heart rate in the 30s. Again, no CPR was performed. The patient received Atropine with subsequent improvement in heart rate.  The patient had a recent hospitalization from December 19-26, 2025, during which she was treated for urinary tract infection with associated confusion and delirium, which has since resolved.   ED Evaluation: Blood pressure (!) 150/65, pulse 75, temperature (!) 94.8 F (34.9 C), temperature source (S) Rectal, resp. rate 14, SpO2 100%.  LABs; CMP within normal exception of glucose of 113, BUN of 32, CBC WBC 5.7, hemoglobin 11.2, hematocrit 33.0, Trope 21, 21, TSH 3.87  Patient had a recent CT of the head/cervical spine on 12/19 -which showed no acute abnormalities  CXR-some atelectasis, negative for any infiltrate    Patient Denies having: Fever, Chills, Cough,  SOB, Chest Pain, Abd pain, N/V/D, headache, dizziness, lightheadedness,  Dysuria, Joint pain, rash, open wounds     Review of Systems: As per HPI, otherwise 10 point review of systems were negative.   ----------------------------------------------------------------------------------------------------------------------  Allergies[1]  Home MEDs:  Prior to Admission medications  Medication Sig Start Date End Date Taking? Authorizing Provider  atorvastatin  (LIPITOR) 10 MG tablet Take 10 mg by mouth daily.    [provider]  calcium -vitamin D  (OSCAL WITH D) 500-200 MG-UNIT tablet Take 1 tablet by mouth daily with breakfast.    [provider]  divalproex  (DEPAKOTE ) 125 MG DR tablet Take two tabs at night 03/31/24   Wertman, Sara E, PA-C  donepezil  (ARICEPT ) 10 MG tablet Take one tablet daily 03/31/24   Wertman, Sara E, PA-C  gabapentin  (NEURONTIN ) 300 MG capsule Take 300 mg by mouth 2 (two) times daily.  06/10/11   [provider]  levothyroxine  (SYNTHROID ) 112 MCG tablet Take 112 mcg by mouth daily. 12/19/21   [provider]  rOPINIRole  (REQUIP ) 0.5 MG tablet Take 0.5 mg by mouth 2 (two) times daily. 01/02/22   [provider]  rOPINIRole  (REQUIP ) 1 MG tablet Take 1 mg by mouth at bedtime. 05/17/11   [provider]  telmisartan (MICARDIS) 40 MG tablet Take 40 mg by mouth daily.    [provider]  PRN MEDs: acetaminophen  **OR** acetaminophen , bisacodyl , hydrALAZINE , HYDROmorphone  (DILAUDID ) injection, ipratropium, ondansetron  **OR** ondansetron  (ZOFRAN ) IV, oxyCODONE , senna-docusate, sodium phosphate , traZODone   Past Medical History:  Diagnosis Date   Arthritis    GERD (gastroesophageal reflux disease)    Hyperlipidemia    Polymyalgia rheumatica    Thyroid  nodule     Past Surgical History:  Procedure Laterality Date   ABDOMINAL HYSTERECTOMY  1978   APPENDECTOMY  1948   BACK SURGERY     x2   CATARACT EXTRACTION,  BILATERAL  03-12-12   bilateral   CHOLECYSTECTOMY  1966   THYROID  LOBECTOMY  03/17/2012   Procedure: THYROID  LOBECTOMY;  Surgeon: Morene ONEIDA Olives, MD;  Location: [facility];  Service: General;  Laterality: Right;   THYROIDECTOMY  03/17/2012   Procedure: THYROIDECTOMY;  Surgeon: Morene ONEIDA Olives, MD;  Location: [facility];  Service: General;  Laterality: N/A;     reports that she has never smoked. She does not have any smokeless tobacco history on file. She reports that she does not drink alcohol and does not use drugs.   Family History  Problem Relation Age of Onset   Heart disease Mother        heart attack   Stroke Father    Cancer Sister        Thyroid     Physical Exam:   Vitals:   10/10/24 1103 10/10/24 1130 10/10/24 1200 10/10/24 1226  BP:  137/82 (!) 150/65   Pulse:  78 75   Resp:  16 14   Temp: (S) (!) 94.5 F (34.7 C)   (!) 94.8 F (34.9 C)  TempSrc: (S) Rectal   (S) Rectal  SpO2:  100% 100%    Constitutional: NAD, calm, comfortable Eyes: PERRL, lids and conjunctivae normal ENMT: Mucous membranes are moist. Posterior pharynx clear of any exudate or lesions.Normal dentition.  Neck: normal, supple, no masses, no thyromegaly Respiratory: clear to auscultation bilaterally, no wheezing, no crackles. Normal respiratory effort. No accessory muscle use.  Cardiovascular: Regular rate and rhythm, no murmurs / rubs / gallops. No extremity edema. 2+ pedal pulses. No carotid bruits.  Abdomen: no tenderness, no masses palpated. No hepatosplenomegaly. Bowel sounds positive.  Musculoskeletal: no clubbing / cyanosis. No joint deformity upper and lower extremities. Good ROM, no contractures. Normal muscle tone.  Neurologic: CN II-XII grossly intact. Sensation intact, DTR normal. Strength 5/5 in all 4.  Psychiatric: Normal judgment and insight. Alert and oriented x 3. Normal mood.  Skin: no rashes, lesions, ulcers. No induration Decubitus/ulcers:  Wounds: per nursing documentation          Labs on admission:    I have personally reviewed following labs and imaging studies  CBC: Recent Labs  Lab 10/10/24 1101 10/10/24 1145  WBC 5.7  --   NEUTROABS 3.6  --   HGB 11.0* 11.2*  HCT 34.4* 33.0*  MCV 95.8  --   PLT 190  --    Basic Metabolic Panel: Recent Labs  Lab 10/10/24 1101 10/10/24 1145  NA 136 138  K 4.2 4.2  CL 102 101  CO2 25  --   GLUCOSE 114* 113*  BUN 32* 32*  CREATININE 0.87 0.90  CALCIUM  8.8*  --   MG 2.1  --    GFR: Estimated Creatinine Clearance: 32.9 mL/min (by C-G formula based on SCr of 0.9 mg/dL). Liver Function Tests: Recent Labs  Lab 10/10/24 1101  AST 23  ALT 19  ALKPHOS 70  BILITOT 0.3  PROT 6.3*  ALBUMIN  3.8    BNP (last 3 results) Recent Labs    10/10/24 1101  PROBNP 261.0    Thyroid  Function Tests: Recent Labs    10/10/24 1101  TSH 3.870   Anemia Panel: No results for input(s): VITAMINB12, FOLATE, FERRITIN, TIBC, IRON, RETICCTPCT in the last 72 hours. Urine analysis:    Component Value Date/Time   COLORURINE YELLOW 10/01/2024 2157   APPEARANCEUR CLOUDY (A) 10/01/2024 2157   LABSPEC 1.020 10/01/2024 2157   PHURINE 6.5 10/01/2024 2157   GLUCOSEU NEGATIVE 10/01/2024 2157   HGBUR TRACE (A) 10/01/2024 2157   BILIRUBINUR NEGATIVE 10/01/2024 2157   KETONESUR NEGATIVE 10/01/2024 2157   PROTEINUR NEGATIVE 10/01/2024 2157   UROBILINOGEN 0.2 09/01/2013 1313   NITRITE NEGATIVE 10/01/2024 2157   LEUKOCYTESUR LARGE (A) 10/01/2024 2157    Last A1C:  No results found for: HGBA1C   Radiologic Exams on Admission:   DG Chest Portable 1 View Result Date: 10/10/2024 EXAM: 1 VIEW(S) XRAY OF THE CHEST 10/10/2024 11:18:00 AM COMPARISON: None available. CLINICAL HISTORY: Bradycardia, syncope, unresponsiveness, hypotension. FINDINGS: LUNGS AND PLEURA: Low lung volumes. Left basilar opacity, favored to reflect atelectasis. Central pulmonary vasculature slightly accentuated by low lung volumes. No  pleural effusion. No pneumothorax. HEART AND MEDIASTINUM: Atherosclerotic calcification of the aortic arch. The cardiomediastinal silhouette is accentuated by low lung volumes and AP projection. BONES AND SOFT TISSUES: Thoracic spondylosis. Old healed left rib fractures. IMPRESSION: 1. Low lung volumes with left basilar opacity, favored to reflect atelectasis. Electronically signed by: Donnice Mania MD 10/10/2024 12:39 PM EST RP Workstation: [id number]    EKG:   Independently reviewed.  Orders placed or performed during the hospital encounter of 10/10/24   EKG 12-Lead   EKG 12-Lead   EKG 12-Lead   ---------------------------------------------------------------------------------------------------------------------------------------    Assessment / Plan:   Principal Problem:   Syncope and collapse Active Problems:   Orthostatic hypotension   HLD (hyperlipidemia)   Cancer of thyroid  (HCC)   HTN (hypertension)   Urinary tract infection   GERD   Hypothyroidism   Assessment and Plan: * Syncope and collapse Syncope and collapse x 2 today associated with bradycardia, hypotension, Mentation, heart rate and blood pressure has improved Now Recent echocardiogram 01/31/2022 was reviewed, within normal limits Troponin 21, 21 EKG on arrival to the ED was reviewed, normal sinus rhythm 1140 acute changes TSH-within normal limits -Will continue heart monitor -No signs of infection, -Gentle IV fluid hydration, orthostatic BP checks -Continue with neurochecks - US  bilateral carotid studies -Cardiologist consulted, appreciate further evaluation recommendation Patient may need a Holter monitor on discharge  Brief episodes of hypotension/bradycardia  - Status post treatment with IV atropine en route to ED - Blood pressure 150/65, heart rate 75   Orthostatic hypotension Monitoring blood pressure closely, continue to check orthostatic Bps   Urinary tract infection Recent UTI infection  12/19 Status post treatment-patient completed course of antibiotics Rechecking UA -will follow-up  HTN (hypertension) -Checking orthostatic BP - Home medication includes Micardis (ARB)     Hypothyroidism/ thyroid  cancer With history of hypothyroidism TSH normal at 3.87, -Continue home dose Synthroid   HLD (hyperlipidemia) Continue statins   Restless leg syndrome  Continue Requip  at lower dose  Dementia With some delirium,  Continue home medication Donepezil , Depakote    Disposition:  Anticipating discharging back to SNF in next 24-48 hours      Consults called: Cardiology -------------------------------------------------------------------------------------------------------------------------------------------- DVT prophylaxis:  heparin  injection 5,000 Units Start: 10/10/24 1400 SCDs Start: 10/10/24 1313   Code Status:  Code Status: Full Code   Admission status: Patient will be admitted as Observation, with a greater than 2 midnight length of stay. Level of care: Telemetry   Family Communication:  none at bedside  (The above findings and plan of care has been discussed with patient in detail, the patient expressed understanding and agreement of above plan)  --------------------------------------------------------------------------------------------------------------------------------------------------  Disposition Plan:  Anticipated 1-2 days Status is: Observation The patient remains OBS appropriate and will d/c before 2 midnights.    ----------------------------------------------------------------------------------------------------------------------------------------------------  Time spent:  30  Min.  Was spent seeing and evaluating the patient, reviewing all medical records, drawn plan of care.  SIGNED: Adriana DELENA Grams, MD, FHM. FAAFP. [facility], Pager  (Please use [email or web address] to page/ or secure chat through epic) If 7PM-7AM,  please contact night-coverage [email or web address],  10/10/2024, 1:43 PM     [1]  Allergies Allergen Reactions   Levofloxacin     REACTION: nausea   Statins     Unknown reaction   "

## 2024-10-10 NOTE — Hospital Course (Signed)
 [name] is a 89 year old female with past medical history significant for hypothyroidism, HLD, GERD and RLS, dementia presented following a syncopal episode with associated bradycardia and hypotension.  The patient was eating at the table when she became unresponsive. Witnesses reported she appeared apneic and pulseless. No cardiopulmonary resuscitation was performed. Emergency medical services were activated. Upon EMS arrival, the patient was initially responsive but subsequently experienced a second episode of apnea and unresponsiveness. She was found to be hypotensive with heart rate in the 30s. Again, no CPR was performed. The patient received Atropine with subsequent improvement in heart rate.  The patient had a recent hospitalization from December 19-26, 2025, during which she was treated for urinary tract infection with associated confusion and delirium, which has since resolved.   ED Evaluation: Blood pressure (!) 150/65, pulse 75, temperature (!) 94.8 F (34.9 C), temperature source (S) Rectal, resp. rate 14, SpO2 100%.  LABs; CMP within normal exception of glucose of 113, BUN of 32, CBC WBC 5.7, hemoglobin 11.2, hematocrit 33.0, Trope 21, 21, TSH 3.87  Patient had a recent CT of the head/cervical spine on 12/19 -which showed no acute abnormalities  CXR-some atelectasis, negative for any infiltrate

## 2024-10-10 NOTE — ED Notes (Signed)
 Pt does not stand, orthostatics not taken at this time.

## 2024-10-10 NOTE — Assessment & Plan Note (Signed)
 Recent UTI infection 12/19 Status post treatment-patient completed course of antibiotics

## 2024-10-10 NOTE — ED Provider Notes (Signed)
 " [place] [facility]ERGENC[facility] DEPARTMENT AT [facility] Provider Note   CSN: [id number] Arrival date & time: 10/10/24  1022     Patient presents with: Loss of Consciousness   [name] is a 89 y.o. female.   The history is provided by the patient and medical records. No language interpreter was used.  Loss of Consciousness Episode history:  Multiple Most recent episode:  Today Duration: several minutes. Timing:  Unable to specify Progression:  Resolved Chronicity:  New Witnessed: yes   Relieved by:  Nothing Worsened by:  Nothing Ineffective treatments:  None tried Associated symptoms: malaise/fatigue   Associated symptoms: no chest pain, no confusion, no diaphoresis, no difficulty breathing, no fever, no headaches, no nausea, no palpitations, no shortness of breath, no vomiting and no weakness        Prior to Admission medications  Medication Sig Start Date End Date Taking? Authorizing Provider  atorvastatin  (LIPITOR) 10 MG tablet Take 10 mg by mouth daily.    [provider]  calcium -vitamin D  (OSCAL WITH D) 500-200 MG-UNIT tablet Take 1 tablet by mouth daily with breakfast.    [provider]  divalproex  (DEPAKOTE ) 125 MG DR tablet Take two tabs at night 03/31/24   Dina, Sara E, PA-C  donepezil  (ARICEPT ) 10 MG tablet Take one tablet daily 03/31/24   Wertman, Sara E, PA-C  gabapentin  (NEURONTIN ) 300 MG capsule Take 300 mg by mouth 2 (two) times daily.  06/10/11   [provider]  levothyroxine  (SYNTHROID ) 112 MCG tablet Take 112 mcg by mouth daily. 12/19/21   [provider]  rOPINIRole  (REQUIP ) 0.5 MG tablet Take 0.5 mg by mouth 2 (two) times daily. 01/02/22   [provider]  rOPINIRole  (REQUIP ) 1 MG tablet Take 1 mg by mouth at bedtime. 05/17/11   [provider]  telmisartan (MICARDIS) 40 MG tablet Take 40 mg by mouth daily.    [provider]    Allergies: Levofloxacin and Statins    Review of  Systems  Constitutional:  Positive for fatigue and malaise/fatigue. Negative for chills, diaphoresis and fever.  HENT:  Negative for congestion.   Respiratory:  Negative for cough, chest tightness, shortness of breath and wheezing.   Cardiovascular:  Positive for syncope. Negative for chest pain and palpitations.  Gastrointestinal:  Negative for abdominal pain, constipation, diarrhea, nausea and vomiting.  Genitourinary:  Negative for dysuria.  Musculoskeletal:  Negative for back pain, neck pain and neck stiffness.  Skin:  Negative for rash and wound.  Neurological:  Positive for syncope and light-headedness. Negative for weakness and headaches.  Psychiatric/Behavioral:  Negative for confusion.   All other systems reviewed and are negative.   Updated Vital Signs BP (!) 150/65   Pulse 75   Temp (S) (!) 94.8 F (34.9 C) (Rectal)   Resp 14   SpO2 100%   Physical Exam Vitals and nursing note reviewed.  Constitutional:      General: She is not in acute distress.    Appearance: She is well-developed. She is not ill-appearing, toxic-appearing or diaphoretic.  HENT:     Head: Normocephalic and atraumatic.     Nose: No congestion or rhinorrhea.     Mouth/Throat:     Mouth: Mucous membranes are moist.     Pharynx: No oropharyngeal exudate or posterior oropharyngeal erythema.  Eyes:     Extraocular Movements: Extraocular movements intact.     Conjunctiva/sclera: Conjunctivae normal.     Pupils: Pupils are equal, round, and  reactive to light.  Cardiovascular:     Rate and Rhythm: Normal rate and regular rhythm.     Pulses: Normal pulses.     Heart sounds: No murmur heard. Pulmonary:     Effort: Pulmonary effort is normal. No respiratory distress.     Breath sounds: Normal breath sounds. No wheezing, rhonchi or rales.  Chest:     Chest wall: No tenderness.  Abdominal:     General: Abdomen is flat.     Palpations: Abdomen is soft.     Tenderness: There is no abdominal tenderness.  There is no guarding or rebound.  Musculoskeletal:        General: No swelling or tenderness.     Cervical back: Neck supple.     Right lower leg: No edema.     Left lower leg: No edema.  Skin:    General: Skin is warm and dry.     Capillary Refill: Capillary refill takes less than 2 seconds.     Findings: No erythema.  Neurological:     General: No focal deficit present.     Mental Status: She is alert.     Sensory: No sensory deficit.     Motor: No weakness.  Psychiatric:        Mood and Affect: Mood normal.     (all labs ordered are listed, but only abnormal results are displayed) Labs Reviewed  CBC WITH DIFFERENTIAL/PLATELET - Abnormal; Notable for the following components:      Result Value   RBC 3.59 (*)    Hemoglobin 11.0 (*)    HCT 34.4 (*)    All other components within normal limits  COMPREHENSIVE METABOLIC PANEL WITH GFR - Abnormal; Notable for the following components:   Glucose, Bld 114 (*)    BUN 32 (*)    Calcium  8.8 (*)    Total Protein 6.3 (*)    All other components within normal limits  I-STAT CHEM 8, ED - Abnormal; Notable for the following components:   BUN 32 (*)    Glucose, Bld 113 (*)    Hemoglobin 11.2 (*)    HCT 33.0 (*)    All other components within normal limits  TROPONIN T, HIGH SENSITIVITY - Abnormal; Notable for the following components:   Troponin T High Sensitivity 21 (*)    All other components within normal limits  EXPECTORATED SPUTUM ASSESSMENT W GRAM STAIN, RFLX TO RESP C  TSH  MAGNESIUM  PRO BRAIN NATRIURETIC PEPTIDE  URINALYSIS, W/ REFLEX TO CULTURE (INFECTION SUSPECTED)  MAGNESIUM  PHOSPHORUS  URINALYSIS, ROUTINE W REFLEX MICROSCOPIC    EKG: EKG Interpretation Date/Time:  Sunday October 10 2024 10:52:22 EST Ventricular Rate:  82 PR Interval:  209 QRS Duration:  97 QT Interval:  417 QTC Calculation: 487 R Axis:   -1  Text Interpretation: Sinus rhythm Borderline prolonged QT interval when compared to prior, similar  appearance No STEMI Confirmed by Ginger Barefoot ([id number]) on 10/10/2024 10:58:55 AM  Radiology: DG Chest Portable 1 View Result Date: 10/10/2024 EXAM: 1 VIEW(S) XRAY OF THE CHEST 10/10/2024 11:18:00 AM COMPARISON: None available. CLINICAL HISTORY: Bradycardia, syncope, unresponsiveness, hypotension. FINDINGS: LUNGS AND PLEURA: Low lung volumes. Left basilar opacity, favored to reflect atelectasis. Central pulmonary vasculature slightly accentuated by low lung volumes. No pleural effusion. No pneumothorax. HEART AND MEDIASTINUM: Atherosclerotic calcification of the aortic arch. The cardiomediastinal silhouette is accentuated by low lung volumes and AP projection. BONES AND SOFT TISSUES: Thoracic spondylosis. Old healed left rib fractures. IMPRESSION:  1. Low lung volumes with left basilar opacity, favored to reflect atelectasis. Electronically signed by: Donnice Mania MD 10/10/2024 12:39 PM EST RP Workstation: [id number]     Procedures   Medications Ordered in the ED  heparin  injection 5,000 Units (5,000 Units Subcutaneous Given 10/10/24 1419)  sodium chloride  flush (NS) 0.9 % injection 3 mL (3 mLs Intravenous Not Given 10/10/24 1332)  0.9 %  sodium chloride  infusion ( Intravenous New Bag/Given 10/10/24 1419)  sodium chloride  flush (NS) 0.9 % injection 3 mL (3 mLs Intravenous Not Given 10/10/24 1333)  acetaminophen  (TYLENOL ) tablet 650 mg (has no administration in time range)    Or  acetaminophen  (TYLENOL ) suppository 650 mg (has no administration in time range)  oxyCODONE  (Oxy IR/ROXICODONE ) immediate release tablet 5 mg (has no administration in time range)  HYDROmorphone  (DILAUDID ) injection 0.5-1 mg (has no administration in time range)  traZODone  (DESYREL ) tablet 25 mg (has no administration in time range)  senna-docusate (Senokot-S) tablet 1 tablet (has no administration in time range)  bisacodyl  (DULCOLAX) EC tablet 5 mg (has no administration in time range)  sodium phosphate  (FLEET) enema  1 enema (has no administration in time range)  ondansetron  (ZOFRAN ) tablet 4 mg (has no administration in time range)    Or  ondansetron  (ZOFRAN ) injection 4 mg (has no administration in time range)  ipratropium (ATROVENT ) nebulizer solution 0.5 mg (has no administration in time range)  hydrALAZINE  (APRESOLINE ) injection 10 mg (has no administration in time range)  calcium -vitamin D  (OSCAL WITH D) 500-200 MG-UNIT per tablet 1 tablet (has no administration in time range)  levothyroxine  (SYNTHROID ) tablet 112 mcg (112 mcg Oral Given 10/10/24 1418)  atorvastatin  (LIPITOR) tablet 10 mg (10 mg Oral Given 10/10/24 1418)  donepezil  (ARICEPT ) tablet 10 mg (has no administration in time range)  gabapentin  (NEURONTIN ) capsule 100 mg (100 mg Oral Given 10/10/24 1418)  irbesartan  (AVAPRO ) tablet 75 mg (75 mg Oral Given 10/10/24 1417)  rOPINIRole  (REQUIP ) tablet 0.25 mg (has no administration in time range)  divalproex  (DEPAKOTE ) DR tablet 125 mg (has no administration in time range)                                    Medical Decision Making Amount and/or Complexity of Data Reviewed Labs: ordered. Radiology: ordered.  Risk Decision regarding hospitalization.    [name] is a 89 y.o. female with a past medical history significant for hyperlipidemia, osteoporosis, previous thyroid  cancer status post thyroidectomy , previous cholecystectomy, previous hysterectomy, previous appendectomy, and anxiety who presents for unresponsive and bradycardic episode.  According to EMS, she was at [facility] rehabilitation and was at the table eating when she suddenly went unresponsive.  There was no trauma reported but the patient was apneic and pulseless.  They dragged her to her room but did not do CPR.  By the time that fire got to her she was answering questions and talking and had a pulse.  She does not member what happened.  She then was being transported to the ambulance when she again went unresponsive  apneic and was hypotensive with a blood pressure of 60.  Her heart rate was into the 30s and she was unresponsive.  She still had a pulse so they gave her atropine and her heart rate improved.  She then woke back up and was able to answer questions.  She is full code.  Patient says that she has been  feeling tired recently but otherwise denies complaints.  She denies fevers, chills, congestion, cough, nausea, vomiting, constipation, diarrhea, or urinary changes.  She denies any recent medication changes and denies any pain at this time.  She is physically denies any headache or neck pain.  Denies any chest pain.  On exam, lungs clear.  Chest nontender.  Abdomen nontender.  She has bruising on her head from what appears to be a fall over a week ago  for which she had a head and neck CT that were reassuring.  Pupils are symmetric and reactive with normal extract movements.  She is answering questions appropriately and knows she is in the hospital.  She had intact sensation, strength, and pulses.  Lungs clear.  No murmur.  Patient remains in sinus rhythm now with a rate in the 80s.  I do not see bradycardia now.  We will get workup to look for concerning findings and will get a workup to look for occult infection with chest x-ray and urinalysis.  It does appear she was recently seen for UTI and discharged several days ago although she is denying any urinary symptoms for me.  Due to the unresponsiveness and bradycardia that respond to atropine, will speak with cardiology and have them consult but anticipate medicine admission.  Spoke to cardiology who will see the patient in consultation but they agree with admission for this unresponsive episode with bradycardia and hypotension.  She was slightly hypothermic so has a Lawyer.  Cardiology considered to cool temperature could lead to the bradycardic spell as well.  Will call medicine for admission.      Final diagnoses:  Syncope and collapse   Bradycardia  Hypotension, unspecified hypotension type    Clinical Impression: 1. Syncope and collapse   2. Bradycardia   3. Hypotension, unspecified hypotension type     Disposition: Admit  This note was prepared with assistance of Dragon voice recognition software. Occasional wrong-word or sound-a-like substitutions may have occurred due to the inherent limitations of voice recognition software.     Natsumi Whitsitt, Lonni PARAS, MD 10/10/24 1506  "

## 2024-10-10 NOTE — Assessment & Plan Note (Signed)
 History of hypertension, was hypotensive with this episode -Monitoring patient blood pressure closely - Home medication includes Micardis (ARB) -holding for now

## 2024-10-11 ENCOUNTER — Observation Stay (HOSPITAL_COMMUNITY)

## 2024-10-11 DIAGNOSIS — R55 Syncope and collapse: Principal | ICD-10-CM

## 2024-10-11 LAB — ECHOCARDIOGRAM COMPLETE
AR max vel: 2.92 cm2
AV Area VTI: 3.43 cm2
AV Area mean vel: 2.83 cm2
AV Mean grad: 4 mmHg
AV Peak grad: 6.5 mmHg
Ao pk vel: 1.27 m/s
Area-P 1/2: 2.51 cm2
Calc EF: 66.5 %
S' Lateral: 2.2 cm
Single Plane A2C EF: 66.3 %
Single Plane A4C EF: 64.2 %

## 2024-10-11 LAB — BASIC METABOLIC PANEL WITH GFR
Anion gap: 10 (ref 5–15)
BUN: 23 mg/dL (ref 8–23)
CO2: 26 mmol/L (ref 22–32)
Calcium: 9.4 mg/dL (ref 8.9–10.3)
Chloride: 103 mmol/L (ref 98–111)
Creatinine, Ser: 0.86 mg/dL (ref 0.44–1.00)
GFR, Estimated: >60 mL/min
Glucose, Bld: 84 mg/dL (ref 70–99)
Potassium: 4.4 mmol/L (ref 3.5–5.1)
Sodium: 139 mmol/L (ref 135–145)

## 2024-10-11 LAB — CBC
HCT: 39.7 % (ref 36.0–46.0)
Hemoglobin: 13.1 g/dL (ref 12.0–15.0)
MCH: 31 pg (ref 26.0–34.0)
MCHC: 33 g/dL (ref 30.0–36.0)
MCV: 94.1 fL (ref 80.0–100.0)
Platelets: 217 K/uL (ref 150–400)
RBC: 4.22 MIL/uL (ref 3.87–5.11)
RDW: 12.6 % (ref 11.5–15.5)
WBC: 6.4 K/uL (ref 4.0–10.5)
nRBC: 0 % (ref 0.0–0.2)

## 2024-10-11 LAB — CBG MONITORING, ED: Glucose-Capillary: 81 mg/dL (ref 70–99)

## 2024-10-11 MED ORDER — HALOPERIDOL LACTATE 5 MG/ML IJ SOLN: 0.5000 mg | Freq: Four times a day (QID) | INTRAMUSCULAR | Status: DC | PRN

## 2024-10-11 NOTE — Progress Notes (Signed)
2D echo complete 

## 2024-10-11 NOTE — ED Notes (Addendum)
 Purewick canister emptied with an output of 650 ml total

## 2024-10-11 NOTE — TOC Initial Note (Signed)
 Transition of Care [facility]) - Initial/Assessment Note    Patient Details  Name: [name] MRN: [id number] Date of Birth: 1927-06-13  [facility]) CM/SW Contact:    Almarie CHRISTELLA Goodie, LCSW Phone Number: 10/11/2024, 3:19 PM  Clinical Narrative:     CSW spoke with patient's daughter, Clarita. Patient recently admitted to the hospital and discharged to [facility] for SNF rehab, had been there since 12/26. Clarita does not want the patient to return, does not feel that she needs rehab with her dementia, but likely needs LTC memory care. Clarita has contacted [facility] but they do not have any availability at this time. CSW discussed with Clarita other memory care options in the [facility] [place] area. If Clarita is unable to locate a memory care at this time, she would likely take the patient home and provide 24/7 care at home instead of returning to SNF, but wants to see how the patient does. Clarita had been paying for an aide prior to the previous hospitalization, she would just expand those hours and stay overnight with the patient. CSW to follow.   Expected Discharge Plan:  (TBD) Barriers to Discharge: Continued Medical Work up, English As A Second Language Teacher   Patient Goals and CMS Choice Patient states their goals for this hospitalization and ongoing recovery are:: patient unable to participate in goal setting, not fully oriented CMS Medicare.gov Compare Post Acute Care list provided to:: Patient Represenative (must comment) Choice offered to / list presented to : Adult Children [place] ownership interest in [facility].provided to:: Adult Children    Expected Discharge Plan and Services     Post Acute Care Choice: NA Living arrangements for the past 2 months: Single Family Home, Skilled Nursing Facility                                      Prior Living Arrangements/Services Living arrangements for the past 2 months: Single Family Home, Skilled Nursing  Facility Lives with:: Self Patient language and need for interpreter reviewed:: No Do you feel safe going back to the place where you live?: Yes      Need for Family Participation in Patient Care: Yes (Comment) Care giver support system in place?: Yes (comment)   Criminal Activity/Legal Involvement Pertinent to Current Situation/Hospitalization: No - Comment as needed  Activities of Daily Living   ADL Screening (condition at time of admission) Independently performs ADLs?: No Does the patient have a NEW difficulty with bathing/dressing/toileting/self-feeding that is expected to last >3 days?: No Does the patient have a NEW difficulty with getting in/out of bed, walking, or climbing stairs that is expected to last >3 days?: Yes (Initiates electronic notice to provider for possible PT consult) Does the patient have a NEW difficulty with communication that is expected to last >3 days?: Yes (Initiates electronic notice to provider for possible SLP consult) Is the patient deaf or have difficulty hearing?: Yes (Hearing aids) Does the patient have difficulty seeing, even when wearing glasses/contacts?: Yes Does the patient have difficulty concentrating, remembering, or making decisions?: Yes  Permission Sought/Granted Permission sought to share information with : Family Supports Permission granted to share information with : Yes, Verbal Permission Granted  Share Information with NAME: Clarita     Permission granted to share info w Relationship: Daughter     Emotional Assessment   Attitude/Demeanor/Rapport: Unable to Assess Affect (typically observed): Unable to Assess Orientation: : Oriented  to Self Alcohol / Substance Use: Not Applicable Psych Involvement: No (comment)  Admission diagnosis:  Syncope and collapse [R55] Bradycardia [R00.1] Hypotension, unspecified hypotension type [I95.9] Patient Active Problem List   Diagnosis Date Noted   Syncope and collapse 10/10/2024    Bradycardia 10/10/2024   Goals of care, counseling/discussion 10/10/2024   Severe dementia (HCC) 10/10/2024   Urinary tract infection 10/01/2024   Orthostatic hypotension 02/10/2022   Acute posthemorrhagic anemia 02/10/2022   Closed fracture of fifth metatarsal bone of left foot 02/10/2022   Fall at home, initial encounter 02/10/2022   Hypothyroidism 02/10/2022   HTN (hypertension) 02/10/2022   Cancer of thyroid  (HCC) 04/14/2012   Anxiety state 05/01/2009   GERD 05/01/2009   HLD (hyperlipidemia) 04/19/2009   Arthropathy 04/18/2009   Osteoporosis 04/18/2009   RESPIRATORY ARREST 04/18/2009   SYNCOPE, HX OF 04/18/2009   PCP:  Wallie Drafts, NP Pharmacy:   [phone] - [place], [place] - [address] [place] [address] Phone: [phone] Fax: [phone]  [id number] [place], [place] - [address] AT CORNER OF [facility] [address] [place] [address] Phone: [phone] Fax: [phone]  [facility] [place],  - [place] [address] [place] [address] Phone: [phone] Fax: [phone]     Social Drivers of Health (SDOH) Social History: SDOH Screenings   Food Insecurity: No Food Insecurity (10/11/2024)  Housing: Low Risk (10/11/2024)  Transportation Needs: No Transportation Needs (10/11/2024)  Utilities: Not At Risk (10/11/2024)  Financial Resource Strain: Low Risk (07/29/2024)   Received from [facility]  Physical Activity: Inactive (07/29/2024)   Received from [facility]  Social Connections: Socially Isolated (10/11/2024)  Stress: No Stress Concern Present (07/29/2024)   Received from [facility]  Tobacco Use: Unknown (10/10/2024)   SDOH Interventions:     Readmission Risk Interventions     No data to display

## 2024-10-11 NOTE — Evaluation (Signed)
 Physical Therapy Evaluation Patient Details Name: [name] MRN: [id number] DOB: [birth year] Today's Date: 10/11/2024  History of Present Illness  89 year old female with past medical history significant for hypothyroidism, HLD, GERD and RLS, dementia presented following a syncopal episode with associated bradycardia and hypotension.  Recent hospitalization 12/22.  Clinical Impression  Pt admitted with above diagnosis and presents to PT with functional limitations due to deficits listed below (See PT problem list). Pt needs skilled PT to maximize independence and safety. Pt mobilizing with supervision/CGA. Per TOC note daughter is interested in pt returning home with incr in home care while looking at LTC options. From a PT standpoint this seems manageable and would recommend HHPT. I was able to check orthostatics which were negative.   Orthostatic BPs  Supine 160/73  Sitting 150/89  Standing Unable to get due to pt moving UE  Standing after 3 min 160/90            If plan is discharge home, recommend the following: A little help with walking and/or transfers;A little help with bathing/dressing/bathroom;Help with stairs or ramp for entrance;Assist for transportation;Direct supervision/assist for medications management;Direct supervision/assist for financial management   Can travel by private vehicle   Yes    Equipment Recommendations Rollator (4 wheels)  Recommendations for Other Services       Functional Status Assessment Patient has had a recent decline in their functional status and demonstrates the ability to make significant improvements in function in a reasonable and predictable amount of time.     Precautions / Restrictions Precautions Precautions: Fall Recall of Precautions/Restrictions: Impaired Restrictions Weight Bearing Restrictions Per Provider Order: No      Mobility  Bed Mobility Overal bed mobility: Needs Assistance Bed Mobility: Supine to Sit, Sit  to Supine     Supine to sit: Contact guard Sit to supine: Contact guard assist        Transfers Overall transfer level: Needs assistance Equipment used: Rollator (4 wheels) Transfers: Sit to/from Stand Sit to Stand: Contact guard assist           General transfer comment: Assist for satey    Ambulation/Gait Ambulation/Gait assistance: Contact guard assist Gait Distance (Feet): 150 Feet Assistive device: Rollator (4 wheels), None Gait Pattern/deviations: Step-through pattern, Decreased stride length Gait velocity: decr Gait velocity interpretation: 1.31 - 2.62 ft/sec, indicative of limited community ambulator   General Gait Details: Assist for safety.  Stairs            Wheelchair Mobility     Tilt Bed    Modified Rankin (Stroke Patients Only)       Balance Overall balance assessment: Needs assistance Sitting-balance support: No upper extremity supported, Feet supported Sitting balance-Leahy Scale: Good     Standing balance support: Single extremity supported, No upper extremity supported, During functional activity, Bilateral upper extremity supported Standing balance-Leahy Scale: Fair                               Pertinent Vitals/Pain Pain Assessment Pain Assessment: No/denies pain    Home Living Family/patient expects to be discharged to:: Private residence Living Arrangements: Alone;Children Available Help at Discharge: Family;Personal care attendant Type of Home: House Home Access: Stairs to enter Entrance Stairs-Rails: None Entrance Stairs-Number of Steps: 3   Home Layout: One level Home Equipment: None Additional Comments: Pt had been at SNF since recent hospitalization. Daughter at the home 8-12pm, and aide 1-7pm. Daughter would like  pt to go home with incr supervision while looking at LTC options.    Prior Function Prior Level of Function : Needs assist             Mobility Comments: Pt chart Pt did not use AD  for mobility. Supervision from her or sitter ADLs Comments: Per chart Pt was able to complete BADLs with supervision from sitter.     Extremity/Trunk Assessment   Upper Extremity Assessment Upper Extremity Assessment: Defer to OT evaluation    Lower Extremity Assessment Lower Extremity Assessment: Generalized weakness    Cervical / Trunk Assessment Cervical / Trunk Assessment: Kyphotic  Communication   Communication Communication: Impaired Factors Affecting Communication: Hearing impaired    Cognition Arousal: Alert Behavior During Therapy: WFL for tasks assessed/performed   PT - Cognitive impairments: History of cognitive impairments, Orientation, Memory, Attention, Problem solving                         Following commands: Impaired Following commands impaired: Follows one step commands inconsistently, Follows one step commands with increased time     Cueing Cueing Techniques: Verbal cues, Tactile cues, Visual cues     General Comments General comments (skin integrity, edema, etc.): VSS on RA. Not orthostatic - see assessment    Exercises     Assessment/Plan    PT Assessment Patient needs continued PT services  PT Problem List Decreased strength;Decreased mobility;Decreased balance;Decreased knowledge of use of DME       PT Treatment Interventions DME instruction;Functional mobility training;Balance training;Patient/family education;Gait training;Therapeutic activities;Therapeutic exercise    PT Goals (Current goals can be found in the Care Plan section)  Acute Rehab PT Goals Patient Stated Goal: per CSW daughter would like pt to go home with incr in home support while looking for LTC options PT Goal Formulation: Patient unable to participate in goal setting Time For Goal Achievement: 10/25/24 Potential to Achieve Goals: Good    Frequency Min 2X/week     Co-evaluation               AM-PAC PT 6 Clicks Mobility  Outcome Measure Help  needed turning from your back to your side while in a flat bed without using bedrails?: None Help needed moving from lying on your back to sitting on the side of a flat bed without using bedrails?: A Little Help needed moving to and from a bed to a chair (including a wheelchair)?: A Little Help needed standing up from a chair using your arms (e.g., wheelchair or bedside chair)?: A Little Help needed to walk in hospital room?: A Little Help needed climbing 3-5 steps with a railing? : A Lot 6 Click Score: 18    End of Session Equipment Utilized During Treatment: Gait belt Activity Tolerance: Patient tolerated treatment well Patient left: in bed;with call bell/phone within reach Nurse Communication: Mobility status PT Visit Diagnosis: Other abnormalities of gait and mobility (R26.89);Unsteadiness on feet (R26.81);Muscle weakness (generalized) (M62.81)    Time: 8750-8678 PT Time Calculation (min) (ACUTE ONLY): 32 min   Charges:   PT Evaluation $PT Eval Moderate Complexity: 1 Mod PT Treatments $Gait Training: 8-22 mins PT General Charges $$ ACUTE PT VISIT: 1 Visit         [facility] PT Acute Rehabilitation Services Office [phone]   [facility] ORN [facility] 10/11/2024, 6:45 PM

## 2024-10-11 NOTE — Evaluation (Signed)
 Occupational Therapy Evaluation Patient Details Name: [name] MRN: [id number] DOB: [birth year] Today's Date: 10/11/2024   History of Present Illness   89 year old female with past medical history significant for hypothyroidism, HLD, GERD and RLS, dementia presented following a syncopal episode with associated bradycardia and hypotension.  Recent hospitalization 12/22.     Clinical Impressions Patient admitted for the diagnosis above.  Of note, recent hospitalization for fall at home.  Currently needing balance support and up to Min A for basic mobility and ADL completion from a sit to stand level.  OT will continue efforts in the acute setting to address deficits, and Patient will benefit from continued inpatient follow up therapy, <3 hours/day.  It appears patient would not have the needed 24 hour assist to transition directly home.  If patient's family can arrange 24 hour support, HH could be a possibility.       If plan is discharge home, recommend the following:   A lot of help with walking and/or transfers;A lot of help with bathing/dressing/bathroom;Assistance with cooking/housework;Assistance with feeding;Direct supervision/assist for medications management;Direct supervision/assist for financial management;Assist for transportation;Help with stairs or ramp for entrance;Supervision due to cognitive status     Functional Status Assessment   Patient has had a recent decline in their functional status and demonstrates the ability to make significant improvements in function in a reasonable and predictable amount of time.     Equipment Recommendations   None recommended by OT     Recommendations for Other Services         Precautions/Restrictions   Precautions Precautions: Fall Recall of Precautions/Restrictions: Impaired Restrictions Weight Bearing Restrictions Per Provider Order: No     Mobility Bed Mobility Overal bed mobility: Needs Assistance Bed  Mobility: Supine to Sit, Sit to Supine     Supine to sit: Supervision Sit to supine: Supervision     Patient Response: Cooperative  Transfers Overall transfer level: Needs assistance Equipment used: Rolling walker (2 wheels) Transfers: Sit to/from Stand, Bed to chair/wheelchair/BSC Sit to Stand: Min assist     Step pivot transfers: Min assist     General transfer comment: safety cueing and balance assist      Balance Overall balance assessment: Needs assistance Sitting-balance support: Bilateral upper extremity supported, Feet supported Sitting balance-Leahy Scale: Good     Standing balance support: Reliant on assistive device for balance Standing balance-Leahy Scale: Poor                             ADL either performed or assessed with clinical judgement   ADL   Eating/Feeding: Set up;Bed level;Sitting   Grooming: Cueing for safety;Contact guard assist;Standing;Wash/dry hands;Minimal assistance   Upper Body Bathing: Contact guard assist;Sitting   Lower Body Bathing: Minimal assistance;Sit to/from stand;Moderate assistance   Upper Body Dressing : Set up;Sitting   Lower Body Dressing: Minimal assistance;Sit to/from stand;Cueing for safety;Moderate assistance Lower Body Dressing Details (indicate cue type and reason): Assist to maintain balance Toilet Transfer: Minimal assistance;Ambulation;Rolling walker (2 wheels);Regular Toilet                   Vision Patient Visual Report: No change from baseline       Perception Perception: Not tested       Praxis Praxis: Not tested       Pertinent Vitals/Pain Pain Assessment Pain Assessment: No/denies pain     Extremity/Trunk Assessment Upper Extremity Assessment Upper Extremity Assessment: Overall WFL for  tasks assessed   Lower Extremity Assessment Lower Extremity Assessment: Defer to PT evaluation   Cervical / Trunk Assessment Cervical / Trunk Assessment: Kyphotic   Communication  Communication Communication: No apparent difficulties   Cognition Arousal: Alert Behavior During Therapy: WFL for tasks assessed/performed Cognition: History of cognitive impairments             OT - Cognition Comments: baseline dementia                 Following commands: Impaired Following commands impaired: Follows one step commands inconsistently, Follows one step commands with increased time     Cueing  General Comments   Cueing Techniques: Verbal cues;Tactile cues;Visual cues      Exercises     Shoulder Instructions      Home Living Family/patient expects to be discharged to:: Private residence Living Arrangements: Children;Other relatives Available Help at Discharge: Family;Other (Comment);Available PRN/intermittently Type of Home: House Home Access: Stairs to enter [employer] of Steps: 3 Entrance Stairs-Rails: None Home Layout: One level     Bathroom Shower/Tub: Sponge bathes at baseline   Bathroom Toilet: Handicapped height Bathroom Accessibility: Yes How Accessible: Accessible via walker     Additional Comments: Daughter at the home 8-12pm, and aide 1-7pm.      Prior Functioning/Environment Prior Level of Function : Needs assist             Mobility Comments: Pt chart Pt did not use AD for mobility. Supervision from her or sitter ADLs Comments: Per chart Pt was able to complete BADLs with supervision from sitter.    OT Problem List: Decreased strength;Decreased activity tolerance;Impaired balance (sitting and/or standing);Decreased cognition;Decreased safety awareness;Decreased knowledge of use of DME or AE   OT Treatment/Interventions: Self-care/ADL training;Therapeutic exercise;Energy conservation;DME and/or AE instruction;Therapeutic activities;Patient/family education;Balance training      OT Goals(Current goals can be found in the care plan section)   Acute Rehab OT Goals Patient Stated Goal: Return home OT Goal  Formulation: With patient Time For Goal Achievement: 10/25/24 Potential to Achieve Goals: Good   OT Frequency:  Min 2X/week    Co-evaluation              AM-PAC OT 6 Clicks Daily Activity     Outcome Measure Help from another person eating meals?: None Help from another person taking care of personal grooming?: A Little Help from another person toileting, which includes using toliet, bedpan, or urinal?: A Lot Help from another person bathing (including washing, rinsing, drying)?: A Lot Help from another person to put on and taking off regular upper body clothing?: A Little Help from another person to put on and taking off regular lower body clothing?: A Lot 6 Click Score: 16   End of Session Equipment Utilized During Treatment: Gait belt;Rolling walker (2 wheels) Nurse Communication: Mobility status  Activity Tolerance: Patient tolerated treatment well Patient left: in bed;with call bell/phone within reach;with bed alarm set  OT Visit Diagnosis: Unsteadiness on feet (R26.81);Muscle weakness (generalized) (M62.81);History of falling (Z91.81);Other symptoms and signs involving cognitive function                Time: 1100-1120 OT Time Calculation (min): 20 min Charges:  OT General Charges $OT Visit: 1 Visit OT Evaluation $OT Eval Moderate Complexity: 1 Mod  10/11/2024  RP, OTR/L  Acute Rehabilitation Services  Office:  [phone]   [name] 10/11/2024, 11:29 AM

## 2024-10-11 NOTE — Progress Notes (Addendum)
 " PROGRESS NOTE  [name] FMW:[id number] DOB: 05-Oct-1927 DOA: 10/10/2024 PCP: Wallie Drafts, NP   LOS: 0 days   Brief narrative:  [name] is a 89 year old female with past medical history of hypothyroidism, hyperlipidemia, GERD and restless leg syndrome with dementia presented to hospital with syncope from skilled nursing facility..  Patient was also noted to be bradycardic with hypotension.  This happened when she was eating.  EMS was activated.  Initially patient was responsive but subsequently experienced a second episode of apnea and unresponsiveness with hypotension.  No CPR was performed but patient received atropine with improvement in heart rate.  In the ED patient's blood pressure was 150/65 and pulse was 75.  Labs were within normal range including TSH of 3.8.  Recent CT of the head/cervical spine on 12/19 -which showed no acute abnormalities.  Chest x-ray showed some atelectasis but negative for infiltrate.   Assessment/Plan: Principal Problem:   Syncope and collapse Active Problems:   Orthostatic hypotension   HLD (hyperlipidemia)   Cancer of thyroid  (HCC)   HTN (hypertension)   Urinary tract infection   GERD   Hypothyroidism   Bradycardia   Goals of care, counseling/discussion   Severe dementia (HCC)  Syncope and collapse Syncope and collapse x 2 associated with bradycardia, hypotension.  Possible vagal episode.  Improved vitals at this time.  Review of previous 2D echocardiogram with normal findings.  Troponins 21 followed by 21.  EKG showed normal sinus rhythm.  TSH within normal limits.  No signs of infection.  Received IV fluid hydration.  Cardiology was consulted.  Follow recommendations.  Check 2D echocardiogram.  Patient will need 4-week live event monitor on discharge.  Unable to do orthostatic vitals since patient unable to stand.  Brief episodes of hypotension/bradycardia  - Status post treatment with IV atropine en route to ED.  Blood pressure in  the ED was much improved.   Recent urinary tract infection Recent UTI infection 12/19.  Treated with antibiotic.  Urinalysis this time without any white cells.   HTN (hypertension) Unable to check orthostatic blood pressure   Hypothyroidism/ thyroid  cancer TSH normal at 3.87, continue Synthroid  from home   Hyperlipidemia Continue statins    Restless leg syndrome  Continue Requip  at lower dose   Dementia Patient is on n Donepezil , Depakote   Debility deconditioning.  Patient is from skilled nursing facility.  DVT prophylaxis: heparin  injection 5,000 Units Start: 10/10/24 1400 SCDs Start: 10/10/24 1313   Disposition: Skilled nursing facility likely 10/12/2024 if okay with cardiology.  Status is: Observation The patient will require care spanning > 2 midnights and should be moved to inpatient because: Need for further monitoring, testing pending,    Code Status:     Code Status: Full Code  Family Communication: None at bedside  Consultants: Cardiology  Procedures: None yet  Anti-infectives:  None   Anti-infectives (From admission, onward)    None        Subjective: Today, patient was seen and examined at bedside.  Patient denies any chest pain, shortness of breath, dyspnea, dizziness lightheadedness or shortness of breath.  Objective: Vitals:   10/11/24 1100 10/11/24 1245  BP: (!) 151/93   Pulse: 72 62  Resp: 18 16  Temp:    SpO2: 98% 100%    Intake/Output Summary (Last 24 hours) at 10/11/2024 1313 Last data filed at 10/11/2024 0635 Gross per 24 hour  Intake 503.38 ml  Output 1150 ml  Net -646.62 ml   [employer]  10/11/24 1126  Weight: 67.6 kg   Body mass index is 28.16 kg/m.   Physical Exam:  GENERAL: Patient is alert awake and oriented. Not in obvious distress. HENT: No scleral pallor or icterus. Pupils equally reactive to light. Oral mucosa is moist NECK: is supple, no gross swelling noted. CHEST: Diminished breath sounds  bilaterally. CVS: S1 and S2 heard, no murmur. Regular rate and rhythm.  ABDOMEN: Soft, non-tender, bowel sounds are present. EXTREMITIES: No edema. CNS: Cranial nerves are intact. No focal motor deficits. SKIN: warm and dry without rashes.  Data Review: I have personally reviewed the following laboratory data and studies,  CBC: Recent Labs  Lab 10/10/24 1101 10/10/24 1145 10/11/24 0717  WBC 5.7  --  6.4  NEUTROABS 3.6  --   --   HGB 11.0* 11.2* 13.1  HCT 34.4* 33.0* 39.7  MCV 95.8  --  94.1  PLT 190  --  217   Basic Metabolic Panel: Recent Labs  Lab 10/10/24 1101 10/10/24 1145 10/11/24 0717  NA 136 138 139  K 4.2 4.2 4.4  CL 102 101 103  CO2 25  --  26  GLUCOSE 114* 113* 84  BUN 32* 32* 23  CREATININE 0.87 0.90 0.86  CALCIUM  8.8*  --  9.4  MG 2.1  --   --    Liver Function Tests: Recent Labs  Lab 10/10/24 1101  AST 23  ALT 19  ALKPHOS 70  BILITOT 0.3  PROT 6.3*  ALBUMIN 3.8   No results for input(s): LIPASE, AMYLASE in the last 168 hours. No results for input(s): AMMONIA in the last 168 hours. Cardiac Enzymes: No results for input(s): CKTOTAL, CKMB, CKMBINDEX, TROPONINI in the last 168 hours. BNP (last 3 results) No results for input(s): BNP in the last 8760 hours.  ProBNP (last 3 results) Recent Labs    10/10/24 1101  PROBNP 261.0    CBG: Recent Labs  Lab 10/11/24 0812  GLUCAP 81   Recent Results (from the past 240 hours)  Urine Culture     Status: Abnormal   Collection Time: 10/01/24  9:57 PM   Specimen: Urine, Clean Catch  Result Value Ref Range Status   Specimen Description URINE, CLEAN CATCH  Final   Special Requests   Final    NONE Performed at [facility], [address], [place], [place] [address]    Culture MULTIPLE SPECIES PRESENT, SUGGEST RECOLLECTION (A)  Final   Report Status 10/02/2024 FINAL  Final     Studies: ECHOCARDIOGRAM COMPLETE Result Date: 10/11/2024    ECHOCARDIOGRAM REPORT   Patient  Name:   [name] Date of Exam: 10/11/2024 Medical Rec #:  [id number]       Height:       61.0 in Accession #:    [id number]      Weight:       149.0 lb Date of Birth:  [birth year]       BSA:          1.667 m Patient Age:    89 years        BP:           153/89 mmHg Patient Gender: F               HR:           60 bpm. Exam Location:  Inpatient Procedure: 2D Echo, Color Doppler and Cardiac Doppler (Both Spectral and Color  Flow Doppler were utilized during procedure). Indications:    Syncope R55  History:        Patient has prior history of Echocardiogram examinations, most                 recent 02/10/2022.  Sonographer:    Sydnee Wilson RDCS Referring Phys: [id number] SHENG L HALEY IMPRESSIONS  1. Left ventricular ejection fraction, by estimation, is 65 to 70%. The left ventricle has normal function. The left ventricle has no regional wall motion abnormalities. There is mild concentric left ventricular hypertrophy. Left ventricular diastolic parameters were normal.  2. Right ventricular systolic function is normal. The right ventricular size is normal.  3. The mitral valve is normal in structure. No evidence of mitral valve regurgitation. No evidence of mitral stenosis.  4. The aortic valve is grossly normal. Aortic valve regurgitation is not visualized. No aortic stenosis is present.  5. The inferior vena cava is normal in size with greater than 50% respiratory variability, suggesting right atrial pressure of 3 mmHg. FINDINGS  Left Ventricle: Left ventricular ejection fraction, by estimation, is 65 to 70%. The left ventricle has normal function. The left ventricle has no regional wall motion abnormalities. The left ventricular internal cavity size was normal in size. There is  mild concentric left ventricular hypertrophy. Left ventricular diastolic parameters were normal. Right Ventricle: The right ventricular size is normal. No increase in right ventricular wall thickness. Right ventricular systolic  function is normal. Left Atrium: Left atrial size was normal in size. Right Atrium: Right atrial size was normal in size. Pericardium: There is no evidence of pericardial effusion. Mitral Valve: The mitral valve is normal in structure. No evidence of mitral valve regurgitation. No evidence of mitral valve stenosis. Tricuspid Valve: The tricuspid valve is normal in structure. Tricuspid valve regurgitation is not demonstrated. No evidence of tricuspid stenosis. Aortic Valve: The aortic valve is grossly normal. Aortic valve regurgitation is not visualized. No aortic stenosis is present. Aortic valve mean gradient measures 4.0 mmHg. Aortic valve peak gradient measures 6.5 mmHg. Aortic valve area, by VTI measures 3.43 cm. Pulmonic Valve: The pulmonic valve was grossly normal. Pulmonic valve regurgitation is not visualized. No evidence of pulmonic stenosis. Aorta: The aortic root is normal in size and structure. Venous: The inferior vena cava is normal in size with greater than 50% respiratory variability, suggesting right atrial pressure of 3 mmHg. IAS/Shunts: No atrial level shunt detected by color flow Doppler.  LEFT VENTRICLE PLAX 2D LVIDd:         3.70 cm     Diastology LVIDs:         2.20 cm     LV e' medial:    8.59 cm/s LV PW:         1.00 cm     LV E/e' medial:  7.6 LV IVS:        1.10 cm     LV e' lateral:   6.42 cm/s LVOT diam:     2.00 cm     LV E/e' lateral: 10.1 LV SV:         97 LV SV Index:   58 LVOT Area:     3.14 cm  LV Volumes (MOD) LV vol d, MOD A2C: 69.8 ml LV vol d, MOD A4C: 95.9 ml LV vol s, MOD A2C: 23.5 ml LV vol s, MOD A4C: 34.3 ml LV SV MOD A2C:     46.3 ml LV SV MOD A4C:  95.9 ml LV SV MOD BP:      56.7 ml RIGHT VENTRICLE RV S prime:     13.90 cm/s TAPSE (M-mode): 2.5 cm LEFT ATRIUM             Index        RIGHT ATRIUM           Index LA diam:        3.60 cm 2.16 cm/m   RA Area:     10.20 cm LA Vol (A2C):   37.1 ml 22.26 ml/m  RA Volume:   21.30 ml  12.78 ml/m LA Vol (A4C):   22.7 ml  13.62 ml/m LA Biplane Vol: 29.0 ml 17.40 ml/m  AORTIC VALVE AV Area (Vmax):    2.92 cm AV Area (Vmean):   2.83 cm AV Area (VTI):     3.43 cm AV Vmax:           127.00 cm/s AV Vmean:          93.000 cm/s AV VTI:            0.283 m AV Peak Grad:      6.5 mmHg AV Mean Grad:      4.0 mmHg LVOT Vmax:         118.00 cm/s LVOT Vmean:        83.900 cm/s LVOT VTI:          0.309 m LVOT/AV VTI ratio: 1.09  AORTA Ao Root diam: 2.90 cm Ao Asc diam:  3.50 cm MITRAL VALVE MV Area (PHT): 2.51 cm    SHUNTS MV Decel Time: 302 msec    Systemic VTI:  0.31 m MV E velocity: 65.00 cm/s  Systemic Diam: 2.00 cm MV A velocity: 90.10 cm/s MV E/A ratio:  0.72 Morene Brownie Electronically signed by Morene Brownie Signature Date/Time: 10/11/2024/12:57:47 PM    Final    DG Chest Portable 1 View Result Date: 10/10/2024 EXAM: 1 VIEW(S) XRAY OF THE CHEST 10/10/2024 11:18:00 AM COMPARISON: None available. CLINICAL HISTORY: Bradycardia, syncope, unresponsiveness, hypotension. FINDINGS: LUNGS AND PLEURA: Low lung volumes. Left basilar opacity, favored to reflect atelectasis. Central pulmonary vasculature slightly accentuated by low lung volumes. No pleural effusion. No pneumothorax. HEART AND MEDIASTINUM: Atherosclerotic calcification of the aortic arch. The cardiomediastinal silhouette is accentuated by low lung volumes and AP projection. BONES AND SOFT TISSUES: Thoracic spondylosis. Old healed left rib fractures. IMPRESSION: 1. Low lung volumes with left basilar opacity, favored to reflect atelectasis. Electronically signed by: Donnice Mania MD 10/10/2024 12:39 PM EST RP Workstation: [id number]      Vernal Alstrom, MD  [facility] 10/11/2024  If 7PM-7AM, please contact night-coverage        "

## 2024-10-11 NOTE — ED Notes (Signed)
 3W secretary notified of patient coming up to unit.

## 2024-10-11 NOTE — ED Notes (Signed)
 Patient unable to cooperate with keeping monitoring equipment attached. Patient within site and calm. No needs expressed.

## 2024-10-11 NOTE — ED Notes (Signed)
 Echo at bedside

## 2024-10-11 NOTE — Progress Notes (Signed)
 Carotid arterial duplex completed. Please see CV Procedures for preliminary results.  Garnette Rockers, RVT 10/11/2024 4:18 PM

## 2024-10-11 NOTE — Progress Notes (Signed)
 "   Progress Note  Patient Name: [name] Date of Encounter: 10/11/2024  Primary Cardiologist:   None   Subjective   She is very confused.  She does not remember details or why she is here.  No acute complaints this morning.    Inpatient Medications    Scheduled Meds:  atorvastatin   10 mg Oral Daily   calcium -vitamin D   1 tablet Oral Q breakfast   divalproex   125 mg Oral QHS   donepezil   10 mg Oral QHS   gabapentin   100 mg Oral BID   heparin   5,000 Units Subcutaneous Q8H   irbesartan   75 mg Oral Daily   levothyroxine   112 mcg Oral Daily   rOPINIRole   0.25 mg Oral QHS   sodium chloride  flush  3 mL Intravenous Q12H   sodium chloride  flush  3 mL Intravenous Q12H   Continuous Infusions:  sodium chloride  Stopped (10/10/24 1925)   PRN Meds: acetaminophen  **OR** acetaminophen , bisacodyl , hydrALAZINE , HYDROmorphone  (DILAUDID ) injection, ipratropium, ondansetron  **OR** ondansetron  (ZOFRAN ) IV, oxyCODONE , senna-docusate, sodium phosphate , traZODone    Vital Signs    Vitals:   10/11/24 0315 10/11/24 0316 10/11/24 0629 10/11/24 0630  BP: (!) 152/138 (!) 153/83  (!) 153/89  Pulse:  73  73  Resp:  16  17  Temp:  97.8 F (36.6 C) 97.9 F (36.6 C) 97.9 F (36.6 C)  TempSrc:  Oral Axillary Axillary  SpO2:  96%  96%    Intake/Output Summary (Last 24 hours) at 10/11/2024 0828 Last data filed at 10/11/2024 9364 Gross per 24 hour  Intake 503.38 ml  Output 1150 ml  Net -646.62 ml   There were no vitals filed for this visit.  Telemetry    NSR - Personally Reviewed  ECG    NA - Personally Reviewed  Physical Exam   GEN: No acute distress.   Neck: No  JVD Cardiac: RRR, no murmurs, rubs, or gallops.  Respiratory: Clear  to auscultation bilaterally. GI: Soft, nontender, non-distended  MS: No  edema; No deformity. Neuro:  Nonfocal  Psych: Normal affect .  Pleasantly confused.   Labs    Chemistry Recent Labs  Lab 10/10/24 1101 10/10/24 1145 10/11/24 0717   NA 136 138 139  K 4.2 4.2 4.4  CL 102 101 103  CO2 25  --  26  GLUCOSE 114* 113* 84  BUN 32* 32* 23  CREATININE 0.87 0.90 0.86  CALCIUM  8.8*  --  9.4  PROT 6.3*  --   --   ALBUMIN 3.8  --   --   AST 23  --   --   ALT 19  --   --   ALKPHOS 70  --   --   BILITOT 0.3  --   --   GFRNONAA >60  --  >60  ANIONGAP 9  --  10     Hematology Recent Labs  Lab 10/10/24 1101 10/10/24 1145 10/11/24 0717  WBC 5.7  --  6.4  RBC 3.59*  --  4.22  HGB 11.0* 11.2* 13.1  HCT 34.4* 33.0* 39.7  MCV 95.8  --  94.1  MCH 30.6  --  31.0  MCHC 32.0  --  33.0  RDW 12.3  --  12.6  PLT 190  --  217    Cardiac EnzymesNo results for input(s): TROPONINI in the last 168 hours. No results for input(s): TROPIPOC in the last 168 hours.   BNP Recent Labs  Lab 10/10/24 1101  PROBNP  261.0     DDimer No results for input(s): DDIMER in the last 168 hours.   Radiology    DG Chest Portable 1 View Result Date: 10/10/2024 EXAM: 1 VIEW(S) XRAY OF THE CHEST 10/10/2024 11:18:00 AM COMPARISON: None available. CLINICAL HISTORY: Bradycardia, syncope, unresponsiveness, hypotension. FINDINGS: LUNGS AND PLEURA: Low lung volumes. Left basilar opacity, favored to reflect atelectasis. Central pulmonary vasculature slightly accentuated by low lung volumes. No pleural effusion. No pneumothorax. HEART AND MEDIASTINUM: Atherosclerotic calcification of the aortic arch. The cardiomediastinal silhouette is accentuated by low lung volumes and AP projection. BONES AND SOFT TISSUES: Thoracic spondylosis. Old healed left rib fractures. IMPRESSION: 1. Low lung volumes with left basilar opacity, favored to reflect atelectasis. Electronically signed by: Donnice Mania MD 10/10/2024 12:39 PM EST RP Workstation: [id number]    Cardiac Studies   Echo pending    Patient Profile     89 y.o. female with a hx of GERD, hyperlipidemia, PMR, hypothyroidism, RLS who is being seen 10/10/2024 for the evaluation of unresponsive episode,  bradycardia at the request of ED.   Assessment & Plan    Unresponsive episode:    Leading etiology , without other objective findings, is profound vagal event with GI complaints as a trigger.    She will need a four week live event monitor.  Echo results pending.   Note not orthostatics since the patient cannot stand.     Dementia:  Unable to keep tele leads on.  She has been agitated and confused.    Prolonged QT:   Avoid further QT prolonging meds.   For questions or updates, please contact [facility] Please consult [email or web address] for contact info under Cardiology/STEMI.   Signed, Lynwood Schilling, MD  10/11/2024, 8:28 AM    "

## 2024-10-11 NOTE — Progress Notes (Signed)
 Transition of Care [facility]) - Inpatient Brief Assessment   Patient Details  Name: [name] MRN: [id number] Date of Birth: 03/12/1927  Transition of Care [facility]) CM/SW Contact:    [name], RN Phone Number: 10/11/2024, 8:58 AM   Clinical Narrative:  RNCM spoke to patient regarding discharge plans. Pt. Stated that she will need transport back home. No other needs noted at this time. Will continue to follow.  Transition of Care Asessment:   Patient has primary care physician: (P) Yes         Readmission risk has been reviewed: (P) Yes Transition of care needs: (P) no transition of care needs at this time

## 2024-10-11 NOTE — Care Management Obs Status (Signed)
 MEDICARE OBSERVATION STATUS NOTIFICATION   Patient Details  Name: [name] MRN: [id number] Date of Birth: [birth year]   Medicare Observation Status Notification Given:  Yes    [name], RN 10/11/2024, 8:54 AM

## 2024-10-12 ENCOUNTER — Observation Stay (HOSPITAL_COMMUNITY)
Admit: 2024-10-12 | Discharge: 2024-10-12 | Disposition: A | Discharge status: Home / Self Care | Attending: Cardiology | Admitting: Cardiology

## 2024-10-12 ENCOUNTER — Other Ambulatory Visit: Payer: Self-pay | Admitting: Student

## 2024-10-12 ENCOUNTER — Other Ambulatory Visit (HOSPITAL_COMMUNITY): Payer: Self-pay

## 2024-10-12 DIAGNOSIS — R55 Syncope and collapse: Secondary | ICD-10-CM | POA: Diagnosis not present

## 2024-10-12 LAB — CBC
HCT: 38.2 % (ref 36.0–46.0)
Hemoglobin: 12.7 g/dL (ref 12.0–15.0)
MCH: 30.7 pg (ref 26.0–34.0)
MCHC: 33.2 g/dL (ref 30.0–36.0)
MCV: 92.3 fL (ref 80.0–100.0)
Platelets: 214 K/uL (ref 150–400)
RBC: 4.14 MIL/uL (ref 3.87–5.11)
RDW: 12.2 % (ref 11.5–15.5)
WBC: 6.8 K/uL (ref 4.0–10.5)
nRBC: 0 % (ref 0.0–0.2)

## 2024-10-12 LAB — BASIC METABOLIC PANEL WITH GFR
Anion gap: 11 (ref 5–15)
BUN: 21 mg/dL (ref 8–23)
CO2: 25 mmol/L (ref 22–32)
Calcium: 9.4 mg/dL (ref 8.9–10.3)
Chloride: 100 mmol/L (ref 98–111)
Creatinine, Ser: 0.79 mg/dL (ref 0.44–1.00)
GFR, Estimated: >60 mL/min
Glucose, Bld: 96 mg/dL (ref 70–99)
Potassium: 4.1 mmol/L (ref 3.5–5.1)
Sodium: 136 mmol/L (ref 135–145)

## 2024-10-12 LAB — GLUCOSE, CAPILLARY: Glucose-Capillary: 104 mg/dL — ABNORMAL HIGH (ref 70–99)

## 2024-10-12 MED ORDER — CAPTOPRIL 25 MG PO TABS: 25.0000 mg | ORAL_TABLET | Freq: Two times a day (BID) | ORAL | 2 refills | Status: DC

## 2024-10-12 MED ORDER — CAPTOPRIL 25 MG PO TABS
25.0000 mg | ORAL_TABLET | Freq: Two times a day (BID) | ORAL | Status: DC
  Administered 2024-10-12: 25 mg via ORAL
  Filled 2024-10-12 (×3): qty 1

## 2024-10-12 MED ORDER — CAPTOPRIL 25 MG PO TABS
25.0000 mg | ORAL_TABLET | Freq: Two times a day (BID) | ORAL | 2 refills | Status: AC
  Filled 2024-10-12: qty 30, 15d supply, fill #0

## 2024-10-12 MED ORDER — TUBERCULIN PPD 5 UNIT/0.1ML ID SOLN
5.0000 [IU] | Freq: Once | INTRADERMAL | Status: DC
  Administered 2024-10-12: 5 [IU] via INTRADERMAL
  Filled 2024-10-12: qty 0.1

## 2024-10-12 NOTE — Progress Notes (Signed)
 Pt was discharged and went to home accompanied by transporter and her daughter.

## 2024-10-12 NOTE — NC FL2 (Signed)
 "   MEDICAID FL2 LEVEL OF CARE FORM     IDENTIFICATION  Patient Name: [name] Birthdate: [birth year] Sex: female Admission Date (Current Location): 10/10/2024  [facility] and [place] Number:  Producer, Television/film/video and Address:  [facility] [place], [address], [place], [place] [address]      Provider Number: [id number]  Attending Physician Name and Address:  Sonjia Held, MD  Relative Name and Phone Number:       Current Level of Care: Home Recommended Level of Care: Memory Care Prior Approval Number:    Date Approved/Denied:   PASRR Number:    Discharge Plan: Other (Comment) (Memory Care)    Current Diagnoses: Patient Active Problem List   Diagnosis Date Noted   Syncope and collapse 10/10/2024   Bradycardia 10/10/2024   Goals of care, counseling/discussion 10/10/2024   Severe dementia (HCC) 10/10/2024   Urinary tract infection 10/01/2024   Orthostatic hypotension 02/10/2022   Acute posthemorrhagic anemia 02/10/2022   Closed fracture of fifth metatarsal bone of left foot 02/10/2022   Fall at home, initial encounter 02/10/2022   Hypothyroidism 02/10/2022   HTN (hypertension) 02/10/2022   Cancer of thyroid  (HCC) 04/14/2012   Anxiety state 05/01/2009   GERD 05/01/2009   HLD (hyperlipidemia) 04/19/2009   Arthropathy 04/18/2009   Osteoporosis 04/18/2009   RESPIRATORY ARREST 04/18/2009   SYNCOPE, HX OF 04/18/2009    Orientation RESPIRATION BLADDER Height & Weight     Self  Normal Incontinent Weight: 139 lb 8.8 oz (63.3 kg) Height:  5' 1 (154.9 cm)  BEHAVIORAL SYMPTOMS/MOOD NEUROLOGICAL BOWEL NUTRITION STATUS      Incontinent Diet (regular)  AMBULATORY STATUS COMMUNICATION OF NEEDS Skin   Limited Assist Verbally Normal                       Personal Care Assistance Level of Assistance  Bathing, Feeding, Dressing Bathing Assistance: Maximum assistance Feeding assistance: Independent Dressing Assistance: Maximum  assistance     Functional Limitations Info             SPECIAL CARE FACTORS FREQUENCY  PT (By licensed PT), OT (By licensed OT)     PT Frequency: eval and treat OT Frequency: eval and treat            Contractures Contractures Info: Not present    Additional Factors Info  Code Status, Allergies, Psychotropic Code Status Info: Full Allergies Info: Levofloxacin, Statins Psychotropic Info: Depakote  125mg  daily at bed; Aricept  10mg  daily at bed          Discharge Medications: STOP taking these medications     telmisartan 40 MG tablet Commonly known as: MICARDIS    tuberculin 5 UNIT/0.1ML injection           TAKE these medications     rOPINIRole  1 MG tablet Commonly known as: REQUIP  Take 1 mg by mouth at bedtime. The timing of this medication is very important.    rOPINIRole  0.5 MG tablet Commonly known as: REQUIP  Take 0.5 mg by mouth 2 (two) times daily. The timing of this medication is very important.    atorvastatin  10 MG tablet Commonly known as: LIPITOR Take 10 mg by mouth daily.    bisacodyl  10 MG suppository Commonly known as: DULCOLAX Place 10 mg rectally daily as needed for moderate constipation.    calcium -vitamin D  500-200 MG-UNIT tablet Commonly known as: OSCAL WITH D Take 1 tablet by mouth daily with breakfast.  captopril  25 MG tablet Commonly known as: CAPOTEN  Take 1 tablet (25 mg total) by mouth 2 (two) times daily.    divalproex  125 MG DR tablet Commonly known as: Depakote  Take two tabs at night What changed:  how much to take how to take this when to take this additional instructions    donepezil  10 MG tablet Commonly known as: ARICEPT  Take one tablet daily What changed: additional instructions    ENEMA RE Place 1 Bottle rectally daily as needed (constipation).    gabapentin  300 MG capsule Commonly known as: NEURONTIN  Take 300 mg by mouth 2 (two) times daily.    levothyroxine  112 MCG tablet Commonly known as:  SYNTHROID  Take 112 mcg by mouth daily.    Milk of Magnesia 1200 MG/15ML suspension Generic drug: magnesium hydroxide Take 30 mLs by mouth daily as needed for mild constipation.    Relevant Imaging Results:  Relevant Lab Results:   Additional Information SS#: [id number]  Gina CHRISTELLA Goodie, LCSW     "

## 2024-10-12 NOTE — Plan of Care (Addendum)
 Orthostatic vitals completed. From lying to sitting BP dropped significantly (see flowsheet) and pt became dizzy and nauseous. Standing BP not attempted. Pt was assisted to the lying position and BP returned to a normotensive state. Pt stated her symptoms improved upon lying.  Problem: Education: Goal: Knowledge of General Education information will improve Description: Including pain rating scale, medication(s)/side effects and non-pharmacologic comfort measures Outcome: Progressing   Problem: Health Behavior/Discharge Planning: Goal: Ability to manage health-related needs will improve Outcome: Progressing   Problem: Clinical Measurements: Goal: Ability to maintain clinical measurements within normal limits will improve Outcome: Progressing Goal: Will remain free from infection Outcome: Progressing Goal: Diagnostic test results will improve Outcome: Progressing Goal: Respiratory complications will improve Outcome: Progressing Goal: Cardiovascular complication will be avoided Outcome: Progressing   Problem: Activity: Goal: Risk for activity intolerance will decrease Outcome: Progressing   Problem: Nutrition: Goal: Adequate nutrition will be maintained Outcome: Progressing   Problem: Coping: Goal: Level of anxiety will decrease Outcome: Progressing   Problem: Elimination: Goal: Will not experience complications related to bowel motility Outcome: Progressing Goal: Will not experience complications related to urinary retention Outcome: Progressing   Problem: Pain Managment: Goal: General experience of comfort will improve and/or be controlled Outcome: Progressing   Problem: Safety: Goal: Ability to remain free from injury will improve Outcome: Progressing   Problem: Skin Integrity: Goal: Risk for impaired skin integrity will decrease Outcome: Progressing

## 2024-10-12 NOTE — Progress Notes (Signed)
 "   Progress Note  Patient Name: [name] Date of Encounter: 10/12/2024  Primary Cardiologist:   Lynwood Schilling, MD   Subjective   Fully awake and very pleasant but very confused.  No acute complaints  Inpatient Medications    Scheduled Meds:  atorvastatin   10 mg Oral Daily   calcium -vitamin D   1 tablet Oral Q breakfast   divalproex   125 mg Oral QHS   donepezil   10 mg Oral QHS   gabapentin   100 mg Oral BID   heparin   5,000 Units Subcutaneous Q8H   irbesartan   75 mg Oral Daily   levothyroxine   112 mcg Oral Daily   rOPINIRole   0.25 mg Oral QHS   sodium chloride  flush  3 mL Intravenous Q12H   sodium chloride  flush  3 mL Intravenous Q12H   Continuous Infusions:   PRN Meds: acetaminophen  **OR** acetaminophen , bisacodyl , haloperidol  lactate, hydrALAZINE , ipratropium, ondansetron  **OR** ondansetron  (ZOFRAN ) IV, oxyCODONE , senna-docusate, sodium phosphate , traZODone    Vital Signs    Vitals:   10/12/24 0338 10/12/24 0500 10/12/24 0552 10/12/24 0802  BP: (!) 166/98 (!) 122/40 (!) 129/55 121/84  Pulse: 78  68 (!) 105  Resp:    16  Temp: (!) 97.2 F (36.2 C)   97.6 F (36.4 C)  TempSrc: Oral     SpO2: 97%   (!) 87%  Weight:  63.3 kg    Height:        Intake/Output Summary (Last 24 hours) at 10/12/2024 0836 Last data filed at 10/12/2024 0559 Gross per 24 hour  Intake 282.89 ml  Output 450 ml  Net -167.11 ml   Filed Weights   10/11/24 1126 10/12/24 0500  Weight: 67.6 kg 63.3 kg    Telemetry    NSR - Personally Reviewed  ECG    NA - Personally Reviewed  Physical Exam   GEN: No  acute distress.   Neck: No  JVD Cardiac: RRR, no murmurs, rubs, or gallops.  Respiratory: Clear   to auscultation bilaterally. GI: Soft, nontender, non-distended, normal bowel sounds  MS:  No edema; No deformity. Neuro:   Nonfocal    Labs    Chemistry Recent Labs  Lab 10/10/24 1101 10/10/24 1145 10/11/24 0717 10/12/24 0137  NA 136 138 139 136  K 4.2 4.2 4.4  4.1  CL 102 101 103 100  CO2 25  --  26 25  GLUCOSE 114* 113* 84 96  BUN 32* 32* 23 21  CREATININE 0.87 0.90 0.86 0.79  CALCIUM  8.8*  --  9.4 9.4  PROT 6.3*  --   --   --   ALBUMIN 3.8  --   --   --   AST 23  --   --   --   ALT 19  --   --   --   ALKPHOS 70  --   --   --   BILITOT 0.3  --   --   --   GFRNONAA >60  --  >60 >60  ANIONGAP 9  --  10 11     Hematology Recent Labs  Lab 10/10/24 1101 10/10/24 1145 10/11/24 0717 10/12/24 0137  WBC 5.7  --  6.4 6.8  RBC 3.59*  --  4.22 4.14  HGB 11.0* 11.2* 13.1 12.7  HCT 34.4* 33.0* 39.7 38.2  MCV 95.8  --  94.1 92.3  MCH 30.6  --  31.0 30.7  MCHC 32.0  --  33.0 33.2  RDW 12.3  --  12.6 12.2  PLT 190  --  217 214    Cardiac EnzymesNo results for input(s): TROPONINI in the last 168 hours. No results for input(s): TROPIPOC in the last 168 hours.   BNP Recent Labs  Lab 10/10/24 1101  PROBNP 261.0     DDimer No results for input(s): DDIMER in the last 168 hours.   Radiology    VAS US  CAROTID Result Date: 10/11/2024 Carotid Arterial Duplex Study Patient Name:  MARYUM BATTERSON  Date of Exam:   10/11/2024 Medical Rec #: [id number]        Accession #:    [id number] Date of Birth: [birth year]        Patient Gender: F Patient Age:   76 years Exam Location:  [facility] Procedure:      VAS US  CAROTID Referring Phys: ADRIANA GRAMS --------------------------------------------------------------------------------  Indications:       Syncope. Risk Factors:      Hyperlipidemia. Comparison Study:  None. Performing Technologist: Garnette Rockers  Examination Guidelines: A complete evaluation includes B-mode imaging, spectral Doppler, color Doppler, and power Doppler as needed of all accessible portions of each vessel. Bilateral testing is considered an integral part of a complete examination. Limited examinations for reoccurring indications may be performed as noted.  Right Carotid Findings:  +----------+--------+--------+--------+------------------+------------------+           PSV cm/sEDV cm/sStenosisPlaque DescriptionComments           +----------+--------+--------+--------+------------------+------------------+ CCA Prox  51                                                           +----------+--------+--------+--------+------------------+------------------+ CCA Distal62      11                                                   +----------+--------+--------+--------+------------------+------------------+ ICA Prox  43      11                                intimal thickening +----------+--------+--------+--------+------------------+------------------+ ICA Distal68      16                                                   +----------+--------+--------+--------+------------------+------------------+ ECA       68      0                                                    +----------+--------+--------+--------+------------------+------------------+ +----------+--------+-------+--------+-------------------+           PSV cm/sEDV cmsDescribeArm Pressure (mmHG) +----------+--------+-------+--------+-------------------+ [id number]                                         +----------+--------+-------+--------+-------------------+ +---------+--------+--+--------+--+  VertebralPSV cm/s51EDV cm/s11 +---------+--------+--+--------+--+  Left Carotid Findings: +----------+--------+--------+--------+------------------+------------------+           PSV cm/sEDV cm/sStenosisPlaque DescriptionComments           +----------+--------+--------+--------+------------------+------------------+ CCA Prox  61      10                                                   +----------+--------+--------+--------+------------------+------------------+ CCA Distal46      11                                intimal thickening  +----------+--------+--------+--------+------------------+------------------+ ICA Prox  51      12                                intimal thickening +----------+--------+--------+--------+------------------+------------------+ ICA Distal103     20                                                   +----------+--------+--------+--------+------------------+------------------+ ECA       83      7                                                    +----------+--------+--------+--------+------------------+------------------+ +----------+--------+--------+--------+-------------------+           PSV cm/sEDV cm/sDescribeArm Pressure (mmHG) +----------+--------+--------+--------+-------------------+ [id number]                                         +----------+--------+--------+--------+-------------------+ +---------+--------+--+--------+--+ VertebralPSV cm/s65EDV cm/s16 +---------+--------+--+--------+--+   Summary: Right Carotid: The extracranial vessels were near-normal with only minimal wall                thickening or plaque. Left Carotid: The extracranial vessels were near-normal with only minimal wall               thickening or plaque. Vertebrals:  Bilateral vertebral arteries demonstrate antegrade flow. Subclavians: Normal flow hemodynamics were seen in bilateral subclavian              arteries. *See table(s) above for measurements and observations.  Electronically signed by Fonda Rim on 10/11/2024 at 4:21:56 PM.    Final    ECHOCARDIOGRAM COMPLETE Result Date: 10/11/2024    ECHOCARDIOGRAM REPORT   Patient Name:   SNEHA WILLIG Date of Exam: 10/11/2024 Medical Rec #:  [id number]       Height:       61.0 in Accession #:    [id number]      Weight:       149.0 lb Date of Birth:  Sep 08, 1927       BSA:          1.667 m Patient Age:    89 years        BP:           153/89 mmHg Patient  Gender: F               HR:           60 bpm. Exam Location:  Inpatient Procedure: 2D  Echo, Color Doppler and Cardiac Doppler (Both Spectral and Color            Flow Doppler were utilized during procedure). Indications:    Syncope R55  History:        Patient has prior history of Echocardiogram examinations, most                 recent 02/10/2022.  Sonographer:    Sydnee Wilson RDCS Referring Phys: [id number] SHENG L HALEY IMPRESSIONS  1. Left ventricular ejection fraction, by estimation, is 65 to 70%. The left ventricle has normal function. The left ventricle has no regional wall motion abnormalities. There is mild concentric left ventricular hypertrophy. Left ventricular diastolic parameters were normal.  2. Right ventricular systolic function is normal. The right ventricular size is normal.  3. The mitral valve is normal in structure. No evidence of mitral valve regurgitation. No evidence of mitral stenosis.  4. The aortic valve is grossly normal. Aortic valve regurgitation is not visualized. No aortic stenosis is present.  5. The inferior vena cava is normal in size with greater than 50% respiratory variability, suggesting right atrial pressure of 3 mmHg. FINDINGS  Left Ventricle: Left ventricular ejection fraction, by estimation, is 65 to 70%. The left ventricle has normal function. The left ventricle has no regional wall motion abnormalities. The left ventricular internal cavity size was normal in size. There is  mild concentric left ventricular hypertrophy. Left ventricular diastolic parameters were normal. Right Ventricle: The right ventricular size is normal. No increase in right ventricular wall thickness. Right ventricular systolic function is normal. Left Atrium: Left atrial size was normal in size. Right Atrium: Right atrial size was normal in size. Pericardium: There is no evidence of pericardial effusion. Mitral Valve: The mitral valve is normal in structure. No evidence of mitral valve regurgitation. No evidence of mitral valve stenosis. Tricuspid Valve: The tricuspid valve is normal in  structure. Tricuspid valve regurgitation is not demonstrated. No evidence of tricuspid stenosis. Aortic Valve: The aortic valve is grossly normal. Aortic valve regurgitation is not visualized. No aortic stenosis is present. Aortic valve mean gradient measures 4.0 mmHg. Aortic valve peak gradient measures 6.5 mmHg. Aortic valve area, by VTI measures 3.43 cm. Pulmonic Valve: The pulmonic valve was grossly normal. Pulmonic valve regurgitation is not visualized. No evidence of pulmonic stenosis. Aorta: The aortic root is normal in size and structure. Venous: The inferior vena cava is normal in size with greater than 50% respiratory variability, suggesting right atrial pressure of 3 mmHg. IAS/Shunts: No atrial level shunt detected by color flow Doppler.  LEFT VENTRICLE PLAX 2D LVIDd:         3.70 cm     Diastology LVIDs:         2.20 cm     LV e' medial:    8.59 cm/s LV PW:         1.00 cm     LV E/e' medial:  7.6 LV IVS:        1.10 cm     LV e' lateral:   6.42 cm/s LVOT diam:     2.00 cm     LV E/e' lateral: 10.1 LV SV:         97 LV SV Index:   58 LVOT  Area:     3.14 cm  LV Volumes (MOD) LV vol d, MOD A2C: 69.8 ml LV vol d, MOD A4C: 95.9 ml LV vol s, MOD A2C: 23.5 ml LV vol s, MOD A4C: 34.3 ml LV SV MOD A2C:     46.3 ml LV SV MOD A4C:     95.9 ml LV SV MOD BP:      56.7 ml RIGHT VENTRICLE RV S prime:     13.90 cm/s TAPSE (M-mode): 2.5 cm LEFT ATRIUM             Index        RIGHT ATRIUM           Index LA diam:        3.60 cm 2.16 cm/m   RA Area:     10.20 cm LA Vol (A2C):   37.1 ml 22.26 ml/m  RA Volume:   21.30 ml  12.78 ml/m LA Vol (A4C):   22.7 ml 13.62 ml/m LA Biplane Vol: 29.0 ml 17.40 ml/m  AORTIC VALVE AV Area (Vmax):    2.92 cm AV Area (Vmean):   2.83 cm AV Area (VTI):     3.43 cm AV Vmax:           127.00 cm/s AV Vmean:          93.000 cm/s AV VTI:            0.283 m AV Peak Grad:      6.5 mmHg AV Mean Grad:      4.0 mmHg LVOT Vmax:         118.00 cm/s LVOT Vmean:        83.900 cm/s LVOT VTI:           0.309 m LVOT/AV VTI ratio: 1.09  AORTA Ao Root diam: 2.90 cm Ao Asc diam:  3.50 cm MITRAL VALVE MV Area (PHT): 2.51 cm    SHUNTS MV Decel Time: 302 msec    Systemic VTI:  0.31 m MV E velocity: 65.00 cm/s  Systemic Diam: 2.00 cm MV A velocity: 90.10 cm/s MV E/A ratio:  0.72 Morene Brownie Electronically signed by Morene Brownie Signature Date/Time: 10/11/2024/12:57:47 PM    Final    DG Chest Portable 1 View Result Date: 10/10/2024 EXAM: 1 VIEW(S) XRAY OF THE CHEST 10/10/2024 11:18:00 AM COMPARISON: None available. CLINICAL HISTORY: Bradycardia, syncope, unresponsiveness, hypotension. FINDINGS: LUNGS AND PLEURA: Low lung volumes. Left basilar opacity, favored to reflect atelectasis. Central pulmonary vasculature slightly accentuated by low lung volumes. No pleural effusion. No pneumothorax. HEART AND MEDIASTINUM: Atherosclerotic calcification of the aortic arch. The cardiomediastinal silhouette is accentuated by low lung volumes and AP projection. BONES AND SOFT TISSUES: Thoracic spondylosis. Old healed left rib fractures. IMPRESSION: 1. Low lung volumes with left basilar opacity, favored to reflect atelectasis. Electronically signed by: Donnice Mania MD 10/10/2024 12:39 PM EST RP Workstation: [id number]    Cardiac Studies   Echo    1. Left ventricular ejection fraction, by estimation, is 65 to 70%. The  left ventricle has normal function. The left ventricle has no regional  wall motion abnormalities. There is mild concentric left ventricular  hypertrophy. Left ventricular diastolic  parameters were normal.   2. Right ventricular systolic function is normal. The right ventricular  size is normal.   3. The mitral valve is normal in structure. No evidence of mitral valve  regurgitation. No evidence of mitral stenosis.   4. The aortic valve is grossly normal. Aortic valve regurgitation  is not  visualized. No aortic stenosis is present.   5. The inferior vena cava is normal in size with greater  than 50%  respiratory variability, suggesting right atrial pressure of 3 mmHg.     Patient Profile     89 y.o. female with a hx of GERD, hyperlipidemia, PMR, hypothyroidism, RLS who is being seen 10/10/2024 for the evaluation of unresponsive episode, bradycardia at the request of ED.   Assessment & Plan    Unresponsive episode:    Likely related to orthostatic hypotension.  Her orthostatics were checked by nursing and she had a significant symptomatic drop in BP from lying to sitting.    Given the report of bradycardia on admission and treatment with atropine I will suggest a four week monitor.  She has supine hypertension so I would continue her BP meds although I am going to try to switch her ACE to shorter acting captopril  starting with BID dosing the timing of which can be changed by her primary providers.     Dementia:  Pleasantly confused.   Prolonged QT:   Avoid further QT prolonging meds.   For questions or updates, please contact [facility] Please consult [email or web address] for contact info under Cardiology/STEMI.   Signed, Lynwood Schilling, MD  10/12/2024, 8:36 AM    "

## 2024-10-12 NOTE — TOC Transition Note (Addendum)
 Transition of Care [facility]) - Discharge Note   Patient Details  Name: [name] MRN: [id number] Date of Birth: [birth year]  Transition of [facility]) CM/SW Contact:  Almarie CHRISTELLA Goodie, LCSW Phone Number: 10/12/2024, 2:26 PM   Clinical Narrative:   CSW met with patient's daughter, [name], to discuss disposition. CSW discussed patient's evaluations with therapy and recommendation for constant supervision, daughter in agreement. Daughter toured [employer] [facility] yesterday and will be moving forward with moving the patient in there, she is in agreement with taking the patient home in the meantime. CSW contacted Josetta Humble to ask about assisting with paperwork, will send completed FL2 and TB test once administered. CSW confirmed with [facility] that they can read the TB test when it is due. CSW explained everything to daughter [name], she is in agreement. Daughter in agreement with HH, no preference for agency, and daughter has rollator at home for patient. Referral for [facility] given to [facility], information on patient's AVS. No further inpatient care management needs at this time.  UPDATE 3:17 PM: CSW sent information to [facility] after PPD was given with information for follow-up.     Final next level of care: Home w Home Health Services Barriers to Discharge: Barriers Resolved   Patient Goals and CMS Choice Patient states their goals for this hospitalization and ongoing recovery are:: patient unable to participate in goal setting, not fully oriented CMS Medicare.gov Compare Post Acute Care list provided to:: Patient Represenative (must comment) Choice offered to / list presented to : Adult Children [facility] ownership interest in [facility].provided to:: Adult Children    Discharge Placement                Patient to be transferred to facility by: Daughter Name of family member notified: [name] Patient and family notified of of transfer: 10/12/24  Discharge  Plan and Services Additional resources added to the After Visit Summary for       Post Acute Care Choice: NA                    HH Arranged: PT, OT HH Agency: [facility] Date [facility] Agency Contacted: 10/12/24   Representative spoke with at [facility] Agency: Darleene  Social Drivers of Health (SDOH) Interventions SDOH Screenings   Food Insecurity: No Food Insecurity (10/11/2024)  Housing: Low Risk (10/11/2024)  Transportation Needs: No Transportation Needs (10/11/2024)  Utilities: Not At Risk (10/11/2024)  Financial Resource Strain: Low Risk (07/29/2024)   Received from [facility]  Physical Activity: Inactive (07/29/2024)   Received from [facility]  Social Connections: Socially Isolated (10/11/2024)  Stress: No Stress Concern Present (07/29/2024)   Received from [facility]  Tobacco Use: Unknown (10/10/2024)     Readmission Risk Interventions     No data to display

## 2024-10-12 NOTE — Progress Notes (Signed)
 4-week cardiac monitor for syncope per Dr Lavona.  Dr. Lavona to read.  Has follow-up appointment scheduled.

## 2024-10-12 NOTE — Discharge Summary (Signed)
 "  Physician Discharge Summary  AMBREA HEGLER FMW:[id number] DOB: 01-02-1927 DOA: 10/10/2024  PCP: McClanahan, Kyra, NP  Admit date: 10/10/2024 Discharge date: 10/12/2024  Admitted From: Home  Discharge disposition: Home   Recommendations for Outpatient Follow-Up:   Follow up with your primary care provider in one week.  Check CBC, BMP, magnesium in the next visit Follow-up with cardiology outpatient with cardiac monitor results   Discharge Diagnosis:   Principal Problem:   Syncope and collapse Active Problems:   Orthostatic hypotension   HLD (hyperlipidemia)   Cancer of thyroid  (HCC)   HTN (hypertension)   Urinary tract infection   GERD   Hypothyroidism   Bradycardia   Goals of care, counseling/discussion   Severe dementia (HCC)    Discharge Condition: Improved.  Diet recommendation: Regular.  Wound care: None.  Code status: Full.   History of Present Illness:   [name] is a 89 year old female with past medical history of hypothyroidism, hyperlipidemia, GERD and restless leg syndrome with dementia presented to hospital with syncope from skilled nursing facility..  Patient was also noted to be bradycardic with hypotension.  This happened when she was eating.  EMS was activated.  Initially patient was responsive but subsequently experienced a second episode of apnea and unresponsiveness with hypotension.  No CPR was performed but patient received atropine with improvement in heart rate.  In the ED patient's blood pressure was 150/65 and pulse was 75.  Labs were within normal range including TSH of 3.8.  Recent CT of the head/cervical spine on 12/19 -which showed no acute abnormalities.  Chest x-ray showed some atelectasis but negative for infiltrate.    Hospital Course:   Following conditions were addressed during hospitalization as listed below,  Syncope and collapse Syncope and collapse x 2 associated with bradycardia, hypotension.  Possible vagal  episode.  Improved vitals at this time.  Review of previous 2D echocardiogram with normal findings.  Troponins 21 followed by 21.  EKG showed normal sinus rhythm.  TSH within normal limits.  No signs of infection.  During hospitalization patient received IV fluid hydration.  Cardiology was consulted.  2D echocardiogram showed LV ejection fraction of 65 to 70% with LVH..  Cardiology has recommended monitor on discharge which will be arranged by cardiology.  Patient will follow-up with cardiology as well on discharge.   Brief episodes of hypotension/bradycardia  Status post treatment with IV atropine en route to ED.  Blood pressure in the ED was much improved.  Losartan has been changed to captopril  at this time.   Recent urinary tract infection Recent UTI infection 12/19.  Treated with antibiotic.  Urinalysis this time without any white cells.   HTN (hypertension) On captopril  on discharge.   Hypothyroidism/ thyroid  cancer TSH normal at 3.87, continue Synthroid  from home   Hyperlipidemia Continue statins    Restless leg syndrome  Continue Requip  on discharge   Dementia continue Donepezil , Depakote  long-term plan for memory care placement.   Debility deconditioning.  Disposition.  At this time, patient is stable for disposition home with outpatient PCP follow-up.  Medical Consultants:   Cardiology  Procedures:    None Subjective:   Today, patient was seen and examined at bedside.  Feels okay.  Has underlying dementia.  No interval complaints.  Discharge Exam:   Vitals:   10/12/24 1153 10/12/24 1239  BP: 112/69 130/70  Pulse: 78   Resp: 18   Temp: 97.6 F (36.4 C)   SpO2: 97%    Vitals:  10/12/24 0552 10/12/24 0802 10/12/24 1153 10/12/24 1239  BP: (!) 129/55 121/84 112/69 130/70  Pulse: 68 (!) 105 78   Resp:  16 18   Temp:  97.6 F (36.4 C) 97.6 F (36.4 C)   TempSrc:      SpO2:  (!) 87% 97%   Weight:      Height:       Body mass index is 26.37 kg/m.   General: Alert awake, has baseline dementia with disorientation not in obvious distress HENT: pupils equally reacting to light,  No scleral pallor or icterus noted. Oral mucosa is moist.  Chest: Diminished breath sounds bilaterally. CVS: S1 &S2 heard. No murmur.  Regular rate and rhythm. Abdomen: Soft, nontender, nondistended.  Bowel sounds are heard.   Extremities: No cyanosis, clubbing or edema.  Peripheral pulses are palpable. Psych: Alert, awake and disoriented normal mood CNS:  No cranial nerve deficits.  Power equal in all extremities.  Generalized weakness noted Skin: Warm and dry.  No rashes noted.  The results of significant diagnostics from this hospitalization (including imaging, microbiology, ancillary and laboratory) are listed below for reference.     Diagnostic Studies:   No results found.   Labs:   Basic Metabolic Panel: Recent Labs  Lab 10/10/24 1101 10/10/24 1145 10/11/24 0717 10/12/24 0137  NA 136 138 139 136  K 4.2 4.2 4.4 4.1  CL 102 101 103 100  CO2 25  --  26 25  GLUCOSE 114* 113* 84 96  BUN 32* 32* 23 21  CREATININE 0.87 0.90 0.86 0.79  CALCIUM  8.8*  --  9.4 9.4  MG 2.1  --   --   --    GFR Estimated Creatinine Clearance: 35.9 mL/min (by C-G formula based on SCr of 0.79 mg/dL). Liver Function Tests: Recent Labs  Lab 10/10/24 1101  AST 23  ALT 19  ALKPHOS 70  BILITOT 0.3  PROT 6.3*  ALBUMIN 3.8   No results for input(s): LIPASE, AMYLASE in the last 168 hours. No results for input(s): AMMONIA in the last 168 hours. Coagulation profile No results for input(s): INR, PROTIME in the last 168 hours.  CBC: Recent Labs  Lab 10/10/24 1101 10/10/24 1145 10/11/24 0717 10/12/24 0137  WBC 5.7  --  6.4 6.8  NEUTROABS 3.6  --   --   --   HGB 11.0* 11.2* 13.1 12.7  HCT 34.4* 33.0* 39.7 38.2  MCV 95.8  --  94.1 92.3  PLT 190  --  217 214   Cardiac Enzymes: No results for input(s): CKTOTAL, CKMB, CKMBINDEX, TROPONINI in  the last 168 hours. BNP: Invalid input(s): POCBNP CBG: Recent Labs  Lab 10/11/24 0812 10/12/24 1229  GLUCAP 81 104*   D-Dimer No results for input(s): DDIMER in the last 72 hours. Hgb A1c No results for input(s): HGBA1C in the last 72 hours. Lipid Profile No results for input(s): CHOL, HDL, LDLCALC, TRIG, CHOLHDL, LDLDIRECT in the last 72 hours. Thyroid  function studies Recent Labs    10/10/24 1101  TSH 3.870   Anemia work up No results for input(s): VITAMINB12, FOLATE, FERRITIN, TIBC, IRON, RETICCTPCT in the last 72 hours. Microbiology No results found for this or any previous visit (from the past 240 hours).   Discharge Instructions:   Discharge Instructions     Diet general   Complete by: As directed    Discharge instructions   Complete by: As directed    Follow-up with your primary care provider in 1 week.  Check blood work at that time.  Seek medical attention for worsening symptoms.  Follow-up with cardiology as outpatient when scheduled by the clinic with monitor results.   Increase activity slowly   Complete by: As directed       Allergies as of 10/12/2024       Reactions   Levofloxacin    REACTION: nausea   Statins    Unknown reaction        Medication List     STOP taking these medications    telmisartan 40 MG tablet Commonly known as: MICARDIS   tuberculin 5 UNIT/0.1ML injection       TAKE these medications    rOPINIRole  1 MG tablet Commonly known as: REQUIP  Take 1 mg by mouth at bedtime. The timing of this medication is very important.   rOPINIRole  0.5 MG tablet Commonly known as: REQUIP  Take 0.5 mg by mouth 2 (two) times daily. The timing of this medication is very important.   atorvastatin  10 MG tablet Commonly known as: LIPITOR Take 10 mg by mouth daily.   bisacodyl  10 MG suppository Commonly known as: DULCOLAX Place 10 mg rectally daily as needed for moderate constipation.    calcium -vitamin D  500-200 MG-UNIT tablet Commonly known as: OSCAL WITH D Take 1 tablet by mouth daily with breakfast.   captopril  25 MG tablet Commonly known as: CAPOTEN  Take 1 tablet (25 mg total) by mouth 2 (two) times daily.   divalproex  125 MG DR tablet Commonly known as: Depakote  Take two tabs at night What changed:  how much to take how to take this when to take this additional instructions   donepezil  10 MG tablet Commonly known as: ARICEPT  Take one tablet daily What changed: additional instructions   ENEMA RE Place 1 Bottle rectally daily as needed (constipation).   gabapentin  300 MG capsule Commonly known as: NEURONTIN  Take 300 mg by mouth 2 (two) times daily.   levothyroxine  112 MCG tablet Commonly known as: SYNTHROID  Take 112 mcg by mouth daily.   Milk of Magnesia 1200 MG/15ML suspension Generic drug: magnesium hydroxide Take 30 mLs by mouth daily as needed for mild constipation.        Follow-up Information     McClanahan, Kyra, NP Follow up in 1 week(s).   Specialty: Adult Health Nurse Practitioner Contact information: [address] [place] [address] [phone]                  Time coordinating discharge: 39 minutes  Signed:  Latresa Gasser  [facility] 10/12/2024, 1:47 PM          "

## 2024-10-12 NOTE — Evaluation (Signed)
 Speech Language Pathology Evaluation Patient Details Name: [name] MRN: [id number] DOB: 13-Apr-1927 Today's Date: 10/12/2024 Time: 8699-8674 SLP Time Calculation (min) (ACUTE ONLY): 25 min  Problem List:  Patient Active Problem List   Diagnosis Date Noted   Syncope and collapse 10/10/2024   Bradycardia 10/10/2024   Goals of care, counseling/discussion 10/10/2024   Severe dementia (HCC) 10/10/2024   Urinary tract infection 10/01/2024   Orthostatic hypotension 02/10/2022   Acute posthemorrhagic anemia 02/10/2022   Closed fracture of fifth metatarsal bone of left foot 02/10/2022   Fall at home, initial encounter 02/10/2022   Hypothyroidism 02/10/2022   HTN (hypertension) 02/10/2022   Cancer of thyroid  (HCC) 04/14/2012   Anxiety state 05/01/2009   GERD 05/01/2009   HLD (hyperlipidemia) 04/19/2009   Arthropathy 04/18/2009   Osteoporosis 04/18/2009   RESPIRATORY ARREST 04/18/2009   SYNCOPE, HX OF 04/18/2009   Past Medical History:  Past Medical History:  Diagnosis Date   Arthritis    GERD (gastroesophageal reflux disease)    Hyperlipidemia    Polymyalgia rheumatica    Thyroid  nodule    Past Surgical History:  Past Surgical History:  Procedure Laterality Date   ABDOMINAL HYSTERECTOMY  1978   APPENDECTOMY  1948   BACK SURGERY     x2   CATARACT EXTRACTION, BILATERAL  03-12-12   bilateral   CHOLECYSTECTOMY  1966   THYROID  LOBECTOMY  03/17/2012   Procedure: THYROID  LOBECTOMY;  Surgeon: Morene ONEIDA Olives, MD;  Location: [facility];  Service: General;  Laterality: Right;   THYROIDECTOMY  03/17/2012   Procedure: THYROIDECTOMY;  Surgeon: Morene ONEIDA Olives, MD;  Location: [facility];  Service: General;  Laterality: N/A;   HPI:  Pt is a 89 year old female who presented following a syncopal episode with associated bradycardia and hypotension. PMH significant for hypothyroidism, HLD, GERD and RLS, dementia.  Pt was recently hospitalization 12/22. Head CT unremarkable for  intracranial abnormality.   Assessment / Plan / Recommendation Clinical Impression  Pt was seen for a cognitive-linguistic evaluation and presents with cognitive deficits in the areas of short-term memory, attention, thought organization, and safety judgement.  No dysarthria or aphasia was noted during conversational speech tasks.   Pt completed portions of the [employer] Mental Status Examination in addition to informal measures.  Of note, pt has baseline dementia.  Pt's daughter reported that her cognition appears to have worsened slightly since admission to the hospital, but that it is starting to improve and is close to her baseline.  Pt currently has full supervision and assistance during the day, with plans to initiate 24/7 supervision or transition to a memory care unit after discharge.  Given that pt's cognition is close to baseline and that she will have the appropriate supervision at home, no further skilled ST is warranted at this time.  Please reconsult if additional needs arise.    SLP Assessment  SLP Recommendation/Assessment: Patient does not need any further Speech Language Pathology Services SLP Visit Diagnosis: Cognitive communication deficit (R41.841)     Assistance Recommended at Discharge  Frequent or constant Supervision/Assistance  Functional Status Assessment Patient has had a recent decline in their functional status and demonstrates the ability to make significant improvements in function in a reasonable and predictable amount of time.  Frequency and Duration           SLP Evaluation Cognition  Overall Cognitive Status: History of cognitive impairments - at baseline Arousal/Alertness: Awake/alert Orientation Level: Oriented to person;Disoriented to place;Disoriented to time;Disoriented to situation  Attention: Focused Focused Attention: Impaired Focused Attention Impairment: Verbal basic Memory: Impaired Memory Impairment: Decreased recall of new  information;Decreased short term memory Decreased Short Term Memory: Verbal basic Awareness: Impaired Awareness Impairment: Emergent impairment Problem Solving: Impaired Problem Solving Impairment: Verbal basic Executive Function: Reasoning Reasoning: Impaired Safety/Judgment: Impaired       Comprehension  Auditory Comprehension Overall Auditory Comprehension: Appears within functional limits for tasks assessed    Expression Expression Primary Mode of Expression: Verbal Verbal Expression Overall Verbal Expression: Appears within functional limits for tasks assessed   Oral / Motor  Oral Motor/Sensory Function Overall Oral Motor/Sensory Function: Within functional limits Motor Speech Overall Motor Speech: Appears within functional limits for tasks assessed           Earnie Cable, M.S., CCC-SLP Acute Rehabilitation Services Office: [phone]  Earnie SQUIBB Shelbi Vaccaro 10/12/2024, 1:36 PM

## 2024-10-12 NOTE — Progress Notes (Signed)
 14 day zio for syncope per Dr Lavona.  Dr. Lavona to read.  Has follow-up appointment scheduled.

## 2024-10-26 NOTE — Progress Notes (Signed)
 Chart was reviewed and patient had hypotension, bradycardia, and syncope as her presenting problem and given this a BNP was ordered to look for new heart failure contributing to this episode.

## 2024-11-08 NOTE — Progress Notes (Unsigned)
 " Cardiology Office Note:   Date:  11/08/2024  ID:  [name], [name] 1927/07/09, MRN [id number] PCP: Wallie Drafts, NP  [facility] Providers Cardiologist:  Lynwood Schilling, MD {  History of Present Illness:   [name] is a 89 y.o. female with past medical history of hypothyroidism, hyperlipidemia, GERD and restless leg syndrome with dementia who presented to the hospital in Dec 2025 with syncope from skilled nursing facility. Patient was also noted to be bradycardic with hypotension.  This was thought to be related to orthostasis and bradycardia.  She was discharged off of her higher dose of ARB and because of supine hypertension she was sent home on a low dose of a shorting acting ACE.   A long term monitor was ordered. ***   Syncope and collapse Syncope and collapse x 2 associated with bradycardia, hypotension.  Possible vagal episode.  Improved vitals at this time.  Review of previous 2D echocardiogram with normal findings.  Troponins 21 followed by 21.  EKG showed normal sinus rhythm.  TSH within normal limits.  No signs of infection.  During hospitalization patient received IV fluid hydration.  Cardiology was consulted.  2D echocardiogram showed LV ejection fraction of 65 to 70% with LVH..  Cardiology has recommended monitor on discharge which will be arranged by cardiology.  Patient will follow-up with cardiology as well on discharge.   Brief episodes of hypotension/bradycardia  Status post treatment with IV atropine en route to ED.  Blood pressure in the ED was much improved.  Losartan has been changed to captopril  at this time.   Recent urinary tract infection Recent UTI infection 12/19.  Treated with antibiotic.  Urinalysis this time without any white cells.   HTN (hypertension) On captopril  on discharge.   Hypothyroidism/ thyroid  cancer TSH normal at 3.87, continue Synthroid  from home   Hyperlipidemia Continue statins    Restless leg syndrome  Continue  Requip  on discharge   Dementia continue Donepezil , Depakote  long-term plan for memory care placement.   Debility deconditioning.   Disposition.  At this time, patient is stable for disposition home with outpatient PCP follow-up.   ***This happened when she was eating. EMS was activated. Initially patient was responsive but subsequently experienced a second episode of apnea and unresponsiveness with hypotension. No CPR was performed but patient received atropine with improvement in heart rate. In the ED patient's blood pressure was 150/65 and pulse was 75. Labs were within normal range including TSH of 3.8. Recent CT of the head/cervical spine on 12/19 -which showed no acute abnormalities. Chest x-ray showed some atelectasis but negative for infiltrate.   ROS: ***  Studies Reviewed:    EKG:       ***  Risk Assessment/Calculations:   {Does this patient have ATRIAL FIBRILLATION?[phone]} No BP recorded.  {Refresh Note OR Click here to enter BP  :1}***        Physical Exam:   VS:  There were no vitals taken for this visit.   Wt Readings from Last 3 Encounters:  10/12/24 139 lb 8.8 oz (63.3 kg)  10/01/24 149 lb (67.6 kg)  09/30/24 149 lb (67.6 kg)     GEN: Well nourished, well developed in no acute distress NECK: No JVD; No carotid bruits CARDIAC: ***RR, *** murmurs, rubs, gallops RESPIRATORY:  Clear to auscultation without rales, wheezing or rhonchi  ABDOMEN: Soft, non-tender, non-distended EXTREMITIES:  No edema; No deformity   ASSESSMENT AND PLAN:   Unresponsive episode:    ***  Likely related to orthostatic hypotension.  Her orthostatics were checked by nursing and she had a significant symptomatic drop in BP from lying to sitting.    Given the report of bradycardia on admission and treatment with atropine I will suggest a four week monitor.  She has supine hypertension so I would continue her BP meds although I am going to try to switch her ACE to shorter acting captopril   starting with BID dosing the timing of which can be changed by her primary providers.      Dementia:    ***  Pleasantly confused.    Prolonged QT:   ***   Avoid further QT prolonging meds.        Follow up ***  Signed, Lynwood Schilling, MD   "

## 2024-11-09 ENCOUNTER — Ambulatory Visit: Admitting: Cardiology

## 2024-11-09 ENCOUNTER — Telehealth: Payer: Self-pay

## 2024-11-09 NOTE — Telephone Encounter (Signed)
-----   Message from Lynwood Schilling, MD sent at 11/08/2024  1:10 PM EST ----- This patient is on my schedule for tomorrow.    A)  I doubt that her nursing home is going to transport her or should for this visit.   B)  I don't think she ever wore the monitor that was ordered.   If we cannot confirm the monitor can we get it ordered and move the appt to follow up after that  Thanks.

## 2024-11-09 NOTE — Telephone Encounter (Signed)
 Spoke with pt's daughter per [facility] regarding her heart monitor. Pt's daughter stated that the pt has dementia and removed the heart monitor herself shortly after it was placed. Pt's daughter stated she is not interested in trying another heart monitor at this time and the pt's appointment today had already been canceled by daughter yesterday.

## 2025-04-01 ENCOUNTER — Ambulatory Visit: Payer: Self-pay | Admitting: Physician Assistant
